# Patient Record
Sex: Female | Born: 1980 | Race: White | Hispanic: No | Marital: Married | Smoking: Current every day smoker
Health system: Southern US, Community
[De-identification: ages and names within clinical notes are randomized; demographics above are authoritative.]

## PROBLEM LIST (undated history)

## (undated) DIAGNOSIS — E119 Type 2 diabetes mellitus without complications: Secondary | ICD-10-CM

## (undated) DIAGNOSIS — J45909 Unspecified asthma, uncomplicated: Secondary | ICD-10-CM

## (undated) DIAGNOSIS — E039 Hypothyroidism, unspecified: Secondary | ICD-10-CM

## (undated) HISTORY — PX: NO PAST SURGERIES: SHX2092

## (undated) HISTORY — DX: Type 2 diabetes mellitus without complications: E11.9

## (undated) HISTORY — DX: Hypothyroidism, unspecified: E03.9

## (undated) HISTORY — PX: TONSILLECTOMY: SUR1361

## (undated) HISTORY — DX: Unspecified asthma, uncomplicated: J45.909

---

## 2015-06-22 ENCOUNTER — Encounter: Payer: Self-pay | Admitting: *Deleted

## 2015-06-22 DIAGNOSIS — O09523 Supervision of elderly multigravida, third trimester: Secondary | ICD-10-CM | POA: Insufficient documentation

## 2015-06-22 DIAGNOSIS — Z3490 Encounter for supervision of normal pregnancy, unspecified, unspecified trimester: Secondary | ICD-10-CM

## 2015-07-11 ENCOUNTER — Ambulatory Visit (INDEPENDENT_AMBULATORY_CARE_PROVIDER_SITE_OTHER): Payer: Medicaid Other | Admitting: Obstetrics & Gynecology

## 2015-07-11 ENCOUNTER — Encounter: Payer: Self-pay | Admitting: Obstetrics & Gynecology

## 2015-07-11 ENCOUNTER — Other Ambulatory Visit: Payer: Self-pay | Admitting: Obstetrics & Gynecology

## 2015-07-11 ENCOUNTER — Ambulatory Visit (HOSPITAL_COMMUNITY)
Admission: RE | Admit: 2015-07-11 | Discharge: 2015-07-11 | Disposition: A | Discharge status: Home / Self Care | Payer: Medicaid Other | Source: Ambulatory Visit | Attending: Obstetrics & Gynecology | Admitting: Obstetrics & Gynecology

## 2015-07-11 VITALS — BP 137/83 | HR 71 | Ht 64.0 in | Wt 198.4 lb

## 2015-07-11 DIAGNOSIS — Z3689 Encounter for other specified antenatal screening: Secondary | ICD-10-CM

## 2015-07-11 DIAGNOSIS — Z3A01 Less than 8 weeks gestation of pregnancy: Secondary | ICD-10-CM | POA: Diagnosis not present

## 2015-07-11 DIAGNOSIS — Z3491 Encounter for supervision of normal pregnancy, unspecified, first trimester: Secondary | ICD-10-CM

## 2015-07-11 DIAGNOSIS — Z1151 Encounter for screening for human papillomavirus (HPV): Secondary | ICD-10-CM

## 2015-07-11 DIAGNOSIS — E282 Polycystic ovarian syndrome: Secondary | ICD-10-CM

## 2015-07-11 DIAGNOSIS — O3680X0 Pregnancy with inconclusive fetal viability, not applicable or unspecified: Secondary | ICD-10-CM

## 2015-07-11 DIAGNOSIS — Z113 Encounter for screening for infections with a predominantly sexual mode of transmission: Secondary | ICD-10-CM

## 2015-07-11 DIAGNOSIS — E039 Hypothyroidism, unspecified: Secondary | ICD-10-CM | POA: Diagnosis not present

## 2015-07-11 DIAGNOSIS — O99281 Endocrine, nutritional and metabolic diseases complicating pregnancy, first trimester: Secondary | ICD-10-CM | POA: Diagnosis not present

## 2015-07-11 DIAGNOSIS — Z124 Encounter for screening for malignant neoplasm of cervix: Secondary | ICD-10-CM

## 2015-07-11 DIAGNOSIS — K089 Disorder of teeth and supporting structures, unspecified: Secondary | ICD-10-CM

## 2015-07-11 DIAGNOSIS — K219 Gastro-esophageal reflux disease without esophagitis: Secondary | ICD-10-CM

## 2015-07-11 DIAGNOSIS — O4691 Antepartum hemorrhage, unspecified, first trimester: Secondary | ICD-10-CM | POA: Insufficient documentation

## 2015-07-11 DIAGNOSIS — Z36 Encounter for antenatal screening of mother: Secondary | ICD-10-CM | POA: Insufficient documentation

## 2015-07-11 DIAGNOSIS — J4520 Mild intermittent asthma, uncomplicated: Secondary | ICD-10-CM

## 2015-07-11 LAB — POCT URINALYSIS DIP (DEVICE)
BILIRUBIN URINE: NEGATIVE
Glucose, UA: NEGATIVE mg/dL
HGB URINE DIPSTICK: NEGATIVE
KETONES UR: NEGATIVE mg/dL
LEUKOCYTES UA: NEGATIVE
Nitrite: NEGATIVE
PH: 5.5 (ref 5.0–8.0)
Protein, ur: NEGATIVE mg/dL
Urobilinogen, UA: 0.2 mg/dL (ref 0.0–1.0)

## 2015-07-11 MED ORDER — RANITIDINE HCL 150 MG PO TABS: 150.0000 mg | ORAL_TABLET | Freq: Two times a day (BID) | ORAL | Status: DC

## 2015-07-11 MED ORDER — CETIRIZINE HCL 10 MG PO TABS: 10.0000 mg | ORAL_TABLET | Freq: Every day | ORAL | Status: DC

## 2015-07-11 NOTE — Progress Notes (Signed)
First Trimester Screen scheduled for 05/16 @ 2pm. Patient to be worked in to [facility] today for viability. Dental list given to patient.

## 2015-07-11 NOTE — Progress Notes (Signed)
Pt reports not taking her thyroid medication; requesting to take Zantac for heartburn Edema- hands New ob packet given  Declined flu vaccine  Early glucola due to BMI > 30

## 2015-07-12 ENCOUNTER — Other Ambulatory Visit: Payer: Self-pay | Admitting: Obstetrics & Gynecology

## 2015-07-12 ENCOUNTER — Encounter: Payer: Self-pay | Admitting: Obstetrics & Gynecology

## 2015-07-12 ENCOUNTER — Telehealth: Payer: Self-pay

## 2015-07-12 DIAGNOSIS — E282 Polycystic ovarian syndrome: Secondary | ICD-10-CM | POA: Insufficient documentation

## 2015-07-12 DIAGNOSIS — E039 Hypothyroidism, unspecified: Secondary | ICD-10-CM | POA: Insufficient documentation

## 2015-07-12 DIAGNOSIS — K089 Disorder of teeth and supporting structures, unspecified: Secondary | ICD-10-CM | POA: Insufficient documentation

## 2015-07-12 DIAGNOSIS — J45909 Unspecified asthma, uncomplicated: Secondary | ICD-10-CM | POA: Insufficient documentation

## 2015-07-12 DIAGNOSIS — K219 Gastro-esophageal reflux disease without esophagitis: Secondary | ICD-10-CM | POA: Insufficient documentation

## 2015-07-12 LAB — PRENATAL PROFILE (SOLSTAS)
ANTIBODY SCREEN: NEGATIVE
HEP B S AG: NEGATIVE
HIV: NONREACTIVE
RH TYPE: POSITIVE
Rubella: 6.4 Index — ABNORMAL HIGH (ref <0.90)

## 2015-07-12 LAB — GLUCOSE TOLERANCE, 1 HOUR (50G) W/O FASTING: Glucose, 1 Hr, gestational: 239 mg/dL — ABNORMAL HIGH (ref <140)

## 2015-07-12 LAB — GC/CHLAMYDIA PROBE AMP (~~LOC~~) NOT AT ARMC
CHLAMYDIA, DNA PROBE: NEGATIVE
Neisseria Gonorrhea: NEGATIVE

## 2015-07-12 LAB — WET PREP, GENITAL
Trich, Wet Prep: NONE SEEN
Yeast Wet Prep HPF POC: NONE SEEN

## 2015-07-12 LAB — TSH: TSH: 4.43 m[IU]/L

## 2015-07-12 LAB — CYTOLOGY - PAP

## 2015-07-12 MED ORDER — METRONIDAZOLE 500 MG PO TABS: 500.0000 mg | ORAL_TABLET | Freq: Two times a day (BID) | ORAL | Status: DC

## 2015-07-12 MED ORDER — ALBUTEROL SULFATE HFA 108 (90 BASE) MCG/ACT IN AERS: 2.0000 | INHALATION_SPRAY | Freq: Four times a day (QID) | RESPIRATORY_TRACT | Status: DC | PRN

## 2015-07-12 MED ORDER — LEVOTHYROXINE SODIUM 50 MCG PO TABS: 50.0000 ug | ORAL_TABLET | Freq: Every day | ORAL | Status: DC

## 2015-07-12 NOTE — Telephone Encounter (Signed)
TSH elevated--start synthroid 50 mcg daily GCT = 239--needs diabetes counseling ASAP  I have left patient a message to return a call to clinic.

## 2015-07-12 NOTE — Progress Notes (Signed)
   Subjective:    Dominique Mora is a G2P0010 785w2d being seen today for her first obstetrical visit.  Her obstetrical history is significant for advanced maternal age, obesity and PCO, asthma, hypothyroidism, poor dentition. Patient does intend to breast feed. Pregnancy history fully reviewed.  Patient reports allllergy symptoms and would like to restart her Zyrtec.  Pt has associated skin symptoms with urticaria.  Zyrtec helps this.  Pt has some mild asthma issues with occasional wheezing.  Needs new inhaler (expired)  Filed Vitals:   07/11/15 0908 07/11/15 0915  BP: 137/83   Pulse: 71   Height:  5\' 4"  (1.626 m)  Weight: 198 lb 6.4 oz (89.994 kg)     HISTORY: OB History  Gravida Para Term Preterm AB SAB TAB Ectopic Multiple Living  2    1 1         # Outcome Date GA Lbr Len/2nd Weight Sex Delivery Anes PTL Lv  2 Current           1 SAB 10/2002             Past Medical History  Diagnosis Date  . Hypothyroidism    History reviewed. No pertinent past surgical history. History reviewed. No pertinent family history.   Exam    Uterus:     Pelvic Exam:    Perineum: No Hemorrhoids   Vulva: normal   Vagina:  normal mucosa, normal discharge   pH: N/a   Cervix: no lesions and nulliparous appearance   Adnexa: normal adnexa and no mass, fullness, tenderness   Bony Pelvis: average  System: Breast:  normal appearance, no masses or tenderness; left breast greater than right breast, sore with exam   Skin: normal coloration and turgor, no rashes    Neurologic: oriented, normal mood   Extremities: no deformities   HEENT oropharynx clear, no lesions and neck supple with midline trachea   Mouth/Teeth mucous membranes moist, pharynx normal without lesions and dental hygiene poor   Neck supple and no masses   Cardiovascular: regular rate and rhythm   Respiratory:  appears well, vitals normal, no respiratory distress, acyanotic, normal RR, chest clear, no wheezing, crepitations, rhonchi,  normal symmetric air entry   Abdomen: soft, non-tender; bowel sounds normal; no masses,  no organomegaly   Urinary: urethral meatus normal      Assessment:    Pregnancy: G2P0010 Patient Active Problem List   Diagnosis Date Noted  . Asthma 07/12/2015  . PCO (polycystic ovaries) 07/12/2015  . Hypothyroid 07/12/2015  . Poor dentition 07/12/2015  . Supervision of low-risk pregnancy 06/22/2015        Plan:      Initial labs drawn.  Prenatal vitamins.  Problem list reviewed and updated.  Genetic Screening discussed First Screen: ordered.  Ultrasound discussed; fetal survey: requested.  Needs viability today.  Allergy/asthma--refill inhaler and zyrtec  GERD-refill Zantac  Hypothyroidism--Check TSH and trat accordingly  Hx PCO--early GCT  Follow up in 4 weeks.  50% of 45 min visit spent on counseling and coordination of care.    Ashe Gago H. 07/12/2015

## 2015-07-12 NOTE — Addendum Note (Signed)
Addended by: Lesly DukesLEGGETT, KELLY H on: 07/12/2015 05:20 AM   Modules accepted: Orders

## 2015-07-12 NOTE — Progress Notes (Signed)
Pt has been notified of results.

## 2015-07-12 NOTE — Progress Notes (Signed)
Pt has symptomatic BV.  Rx with flagyl.

## 2015-07-12 NOTE — Progress Notes (Signed)
Addendum: TSH elevated--start synthroid 50 mcg daily GCT = 239--needs diabetes counseling ASAP

## 2015-07-12 NOTE — Telephone Encounter (Signed)
I have spoken with patient she is aware of lab results.  Please scheduled her for diabetic counseling

## 2015-07-13 LAB — CULTURE, OB URINE
COLONY COUNT: NO GROWTH
ORGANISM ID, BACTERIA: NO GROWTH

## 2015-07-14 LAB — PRESCRIPTION MONITORING PROFILE (19 PANEL)
AMPHETAMINE/METH: NEGATIVE ng/mL
BARBITURATE SCREEN, URINE: NEGATIVE ng/mL
Benzodiazepine Screen, Urine: NEGATIVE ng/mL
Buprenorphine, Urine: NEGATIVE ng/mL
CARISOPRODOL, URINE: NEGATIVE ng/mL
Cocaine Metabolites: NEGATIVE ng/mL
Creatinine, Urine: 168.74 mg/dL (ref >20.0)
Fentanyl, Ur: NEGATIVE ng/mL
MDMA URINE: NEGATIVE ng/mL
Meperidine, Ur: NEGATIVE ng/mL
Methadone Screen, Urine: NEGATIVE ng/mL
Methaqualone: NEGATIVE ng/mL
Nitrites, Initial: NEGATIVE ug/mL
OPIATE SCREEN, URINE: NEGATIVE ng/mL
OXYCODONE SCRN UR: NEGATIVE ng/mL
PHENCYCLIDINE, UR: NEGATIVE ng/mL
Propoxyphene: NEGATIVE ng/mL
TAPENTADOLUR: NEGATIVE ng/mL
Tramadol Scrn, Ur: NEGATIVE ng/mL
ZOLPIDEM, URINE: NEGATIVE ng/mL
pH, Initial: 5.3 pH (ref 4.5–8.9)

## 2015-07-14 LAB — CANNABANOIDS (GC/LC/MS), URINE

## 2015-07-15 ENCOUNTER — Telehealth: Payer: Self-pay

## 2015-07-15 NOTE — Telephone Encounter (Signed)
Normal cytology, +HARHPV--> Needs cotesting in 1 year. 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

## 2015-07-21 ENCOUNTER — Encounter: Payer: Self-pay | Admitting: Family Medicine

## 2015-07-21 DIAGNOSIS — O24919 Unspecified diabetes mellitus in pregnancy, unspecified trimester: Secondary | ICD-10-CM | POA: Insufficient documentation

## 2015-07-26 ENCOUNTER — Other Ambulatory Visit (HOSPITAL_COMMUNITY): Payer: Self-pay

## 2015-08-09 ENCOUNTER — Encounter: Payer: Self-pay | Admitting: Student

## 2015-08-09 ENCOUNTER — Encounter: Payer: Self-pay | Admitting: Obstetrics and Gynecology

## 2015-08-09 ENCOUNTER — Ambulatory Visit (INDEPENDENT_AMBULATORY_CARE_PROVIDER_SITE_OTHER): Payer: Self-pay | Admitting: Advanced Practice Midwife

## 2015-08-09 VITALS — BP 122/76 | HR 81 | Wt 205.0 lb

## 2015-08-09 DIAGNOSIS — O0991 Supervision of high risk pregnancy, unspecified, first trimester: Secondary | ICD-10-CM

## 2015-08-09 DIAGNOSIS — O24911 Unspecified diabetes mellitus in pregnancy, first trimester: Secondary | ICD-10-CM

## 2015-08-09 DIAGNOSIS — O99281 Endocrine, nutritional and metabolic diseases complicating pregnancy, first trimester: Secondary | ICD-10-CM

## 2015-08-09 DIAGNOSIS — E039 Hypothyroidism, unspecified: Secondary | ICD-10-CM

## 2015-08-09 DIAGNOSIS — E031 Congenital hypothyroidism without goiter: Secondary | ICD-10-CM

## 2015-08-09 LAB — POCT URINALYSIS DIP (DEVICE)
Bilirubin Urine: NEGATIVE
Glucose, UA: NEGATIVE mg/dL
Hgb urine dipstick: NEGATIVE
Leukocytes, UA: NEGATIVE
Nitrite: NEGATIVE
Protein, ur: NEGATIVE mg/dL
Specific Gravity, Urine: >=1.03 (ref 1.005–1.030)
Urobilinogen, UA: 0.2 mg/dL (ref 0.0–1.0)
pH: 5 (ref 5.0–8.0)

## 2015-08-09 MED ORDER — GLUCOSE BLOOD VI STRP: ORAL_STRIP | Status: DC

## 2015-08-09 MED ORDER — ACCU-CHEK AVIVA PLUS W/DEVICE KIT: 1.0000 | PACK | Freq: Once | Status: DC

## 2015-08-09 MED ORDER — ACCU-CHEK FASTCLIX LANCETS MISC: 1.0000 | Freq: Four times a day (QID) | Status: DC

## 2015-08-09 NOTE — Patient Instructions (Signed)
Gestational Diabetes Mellitus  Gestational diabetes mellitus, often simply referred to as gestational diabetes, is a type of diabetes that some women develop during pregnancy. In gestational diabetes, the pancreas does not make enough insulin (a hormone), the cells are less responsive to the insulin that is made (insulin resistance), or both. Normally, insulin moves sugars from food into the tissue cells. The tissue cells use the sugars for energy. The lack of insulin or the lack of normal response to insulin causes excess sugars to build up in the blood instead of going into the tissue cells. As a result, high blood sugar (hyperglycemia) develops. The effect of high sugar (glucose) levels can cause many problems.   RISK FACTORS  You have an increased chance of developing gestational diabetes if you have a family history of diabetes and also have one or more of the following risk factors:  · A body mass index over 30 (obesity).  · A previous pregnancy with gestational diabetes.  · An older age at the time of pregnancy.  If blood glucose levels are kept in the normal range during pregnancy, women can have a healthy pregnancy. If your blood glucose levels are not well controlled, there may be risks to you, your unborn baby (fetus), your labor and delivery, or your newborn baby.   SYMPTOMS   If symptoms are experienced, they are much like symptoms you would normally expect during pregnancy. The symptoms of gestational diabetes include:   · Increased thirst (polydipsia).  · Increased urination (polyuria).  · Increased urination during the night (nocturia).  · Weight loss. This weight loss may be rapid.  · Frequent, recurring infections.  · Tiredness (fatigue).  · Weakness.  · Vision changes, such as blurred vision.  · Fruity smell to your breath.  · Abdominal pain.  DIAGNOSIS  Diabetes is diagnosed when blood glucose levels are increased. Your blood glucose level may be checked by one or more of the following blood  tests:  · A fasting blood glucose test. You will not be allowed to eat for at least 8 hours before a blood sample is taken.  · A random blood glucose test. Your blood glucose is checked at any time of the day regardless of when you ate.  · An oral glucose tolerance test (OGTT). Your blood glucose is measured after you have not eaten (fasted) for 1-3 hours and then after you drink a glucose-containing beverage. Since the hormones that cause insulin resistance are highest at about 24-28 weeks of a pregnancy, an OGTT is usually performed during that time. If you have risk factors, you may be screened for undiagnosed type 2 diabetes at your first prenatal visit.  TREATMENT   Gestational diabetes should be managed first with diet and exercise. Medicines may be added only if they are needed.  · You will need to take diabetes medicine or insulin daily to keep blood glucose levels in the desired range.  · You will need to match insulin dosing with exercise and healthy food choices.  If you have gestational diabetes, your treatment goal is to maintain the following blood glucose levels:  · Before meals (preprandial): at or below 95 mg/dL.  · After meals (postprandial):    One hour after a meal: at or below 140 mg/dL.    Two hours after a meal: at or below 120 mg/dL.  If you have pre-existing type 1 or type 2 diabetes, your treatment goal is to maintain the following blood glucose levels:  · Before   meals, at bedtime, and overnight: 60-99 mg/dL.  · After meals: peak of 100-129 mg/dL.  HOME CARE INSTRUCTIONS   · Have your hemoglobin A1c level checked twice a year.  · Perform daily blood glucose monitoring as directed by your health care provider. It is common to perform frequent blood glucose monitoring.  · Monitor urine ketones when you are ill and as directed by your health care provider.  · Take your diabetes medicine and insulin as directed by your health care provider to maintain your blood glucose level in the desired  range.  ¨ Never run out of diabetes medicine or insulin. It is needed every day.  ¨ Adjust insulin based on your intake of carbohydrates. Carbohydrates can raise blood glucose levels but need to be included in your diet. Carbohydrates provide vitamins, minerals, and fiber which are an essential part of a healthy diet. Carbohydrates are found in fruits, vegetables, whole grains, dairy products, legumes, and foods containing added sugars.  · Eat healthy foods. Alternate 3 meals with 3 snacks.  · Maintain a healthy weight gain. The usual total expected weight gain varies according to your prepregnancy body mass index (BMI).  · Carry a medical alert card or wear your medical alert jewelry.  · Carry a 15-gram carbohydrate snack with you at all times to treat low blood glucose (hypoglycemia). Some examples of 15-gram carbohydrate snacks include:  ¨ Glucose tablets, 3 or 4.  ¨ Glucose gel, 15-gram tube.  ¨ Raisins, 2 tablespoons (24 g).  ¨ Jelly beans, 6.  ¨ Animal crackers, 8.  ¨ Fruit juice, regular soda, or low-fat milk, 4 ounces (120 mL).  ¨ Gummy treats, 9.  · Recognize hypoglycemia. Hypoglycemia during pregnancy occurs with blood glucose levels of 60 mg/dL and below. The risk for hypoglycemia increases when fasting or skipping meals, during or after intense exercise, and during sleep. Hypoglycemia symptoms can include:  ¨ Tremors or shakes.  ¨ Decreased ability to concentrate.  ¨ Sweating.  ¨ Increased heart rate.  ¨ Headache.  ¨ Dry mouth.  ¨ Hunger.  ¨ Irritability.  ¨ Anxiety.  ¨ Restless sleep.  ¨ Altered speech or coordination.  ¨ Confusion.  · Treat hypoglycemia promptly. If you are alert and able to safely swallow, follow the 15:15 rule:  ¨ Take 15-20 grams of rapid-acting glucose or carbohydrate. Rapid-acting options include glucose gel, glucose tablets, or 4 ounces (120 mL) of fruit juice, regular soda, or low-fat milk.  ¨ Check your blood glucose level 15 minutes after taking the glucose.  ¨ Take 15-20  grams more of glucose if the repeat blood glucose level is still 70 mg/dL or below.  ¨ Eat a meal or snack within 1 hour once blood glucose levels return to normal.  · Be alert to polyuria (excess urination) and polydipsia (excess thirst) which are early signs of hyperglycemia. An early awareness of hyperglycemia allows for prompt treatment. Treat hyperglycemia as directed by your health care provider.  · Engage in at least 30 minutes of physical activity a day or as directed by your health care provider. Ten minutes of physical activity timed 30 minutes after each meal is encouraged to control postprandial blood glucose levels.  · Adjust your insulin dosing and food intake as needed if you start a new exercise or sport.  · Follow your sick-day plan at any time you are unable to eat or drink as usual.  · Avoid tobacco and alcohol use.  · Keep all follow-up visits as directed   by your health care provider.  · Follow the advice of your health care provider regarding your prenatal and post-delivery (postpartum) appointments, meal planning, exercise, medicines, vitamins, blood tests, other medical tests, and physical activities.  · Perform daily skin and foot care. Examine your skin and feet daily for cuts, bruises, redness, nail problems, bleeding, blisters, or sores.  · Brush your teeth and gums at least twice a day and floss at least once a day. Follow up with your dentist regularly.  · Schedule an eye exam during the first trimester of your pregnancy or as directed by your health care provider.  · Share your diabetes management plan with your workplace or school.  · Stay up-to-date with immunizations.  · Learn to manage stress.  · Obtain ongoing diabetes education and support as needed.  · Learn about and consider breastfeeding your baby.  · You should have your blood sugar level checked 6-12 weeks after delivery. This is done with an oral glucose tolerance test (OGTT).  SEEK MEDICAL CARE IF:   · You are unable to  eat food or drink fluids for more than 6 hours.  · You have nausea and vomiting for more than 6 hours.  · You have a blood glucose level of 200 mg/dL and you have ketones in your urine.  · There is a change in mental status.  · You develop vision problems.  · You have a persistent headache.  · You have upper abdominal pain or discomfort.  · You develop an additional serious illness.  · You have diarrhea for more than 6 hours.  · You have been sick or have had a fever for a couple of days and are not getting better.  SEEK IMMEDIATE MEDICAL CARE IF:   · You have difficulty breathing.  · You no longer feel the baby moving.  · You are bleeding or have discharge from your vagina.  · You start having premature contractions or labor.  MAKE SURE YOU:  · Understand these instructions.  · Will watch your condition.  · Will get help right away if you are not doing well or get worse.     This information is not intended to replace advice given to you by your health care provider. Make sure you discuss any questions you have with your health care provider.     Document Released: 06/04/2000 Document Revised: 03/19/2014 Document Reviewed: 09/25/2011  Elsevier Interactive Patient Education ©2016 [employer]

## 2015-08-09 NOTE — Progress Notes (Signed)
Subjective:  Dominique Mora is a 35 y.o. G2P0010 at 41w2dbeing seen today for ongoing prenatal care.  She is currently monitored for the following issues for this high-risk pregnancy and has Supervision of high risk pregnancy in first trimester; Asthma; PCO (polycystic ovaries); Hypothyroid; Poor dentition; Acid reflux; and Diabetes mellitus in pregnancy, antepartum on her problem list.  Patient reports no complaints.  Contractions: Not present. Vag. Bleeding: None.  Movement: Absent. Denies leaking of fluid.   The following portions of the patient's history were reviewed and updated as appropriate: allergies, current medications, past family history, past medical history, past social history, past surgical history and problem list. Problem list updated.  Objective:   Filed Vitals:   08/09/15 1004  BP: 122/76  Pulse: 81  Weight: 205 lb (92.987 kg)    Fetal Status: Fetal Heart Rate (bpm): 160   Movement: Absent     General:  Alert, oriented and cooperative. Patient is in no acute distress.  Skin: Skin is warm and dry. No rash noted.   Cardiovascular: Normal heart rate noted  Respiratory: Normal respiratory effort, no problems with respiration noted  Abdomen: Soft, gravid, appropriate for gestational age. Pain/Pressure: Present     Pelvic: Vag. Bleeding: None     Cervical exam deferred        Extremities: Normal range of motion.  Edema: Trace  Mental Status: Normal mood and affect. Normal behavior. Normal judgment and thought content.   Urinalysis: Urine Protein: Negative Urine Glucose: Negative  Assessment and Plan:  Pregnancy: G2P0010 at 140w2d1. Diabetes mellitus in pregnancy, antepartum, first trimester  - Comp Met (CMET) - Protein, Urine, 24 hour - HgB A1c - Blood Glucose Monitoring Suppl (ACCU-CHEK AVIVA PLUS) w/Device KIT; 1 Device by Does not apply route once.  Dispense: 1 kit; Refill: 0 - glucose blood (ACCU-CHEK AVIVA PLUS) test strip; To be used 4 times a day   Dispense: 100 each; Refill: 12 - ACCU-CHEK FASTCLIX LANCETS MISC; 1 Device by Percutaneous route 4 (four) times daily.  Dispense: 100 each; Refill: 12 -Scheduled for diabetes education class on Monday.  2. Supervision of high risk pregnancy in first trimester   3. Congenital hypothyroidism without goiter --Pt taking synthroid but misses pills frequently. Discussed ways to remember to take daily pill.  Pt aware that her TSH will be redrawn at next visit and Q trimester.  Preterm labor symptoms and general obstetric precautions including but not limited to vaginal bleeding, contractions, leaking of fluid and fetal movement were reviewed in detail with the patient. Please refer to After Visit Summary for other counseling recommendations.  Return in about 4 weeks (around 09/06/2015) for high risk clinic.   LiElvera MariaCNM

## 2015-08-09 NOTE — Progress Notes (Signed)
Patient left today before getting labs. Called patient and she states she can come tomorrow.

## 2015-08-10 ENCOUNTER — Other Ambulatory Visit: Payer: Self-pay

## 2015-08-15 ENCOUNTER — Encounter: Payer: Medicaid Other | Attending: Obstetrics & Gynecology | Admitting: Dietician

## 2015-08-15 ENCOUNTER — Encounter (HOSPITAL_COMMUNITY): Payer: Self-pay

## 2015-08-15 ENCOUNTER — Ambulatory Visit: Payer: Medicaid Other | Admitting: *Deleted

## 2015-08-15 DIAGNOSIS — Z029 Encounter for administrative examinations, unspecified: Secondary | ICD-10-CM | POA: Insufficient documentation

## 2015-08-15 DIAGNOSIS — O24919 Unspecified diabetes mellitus in pregnancy, unspecified trimester: Secondary | ICD-10-CM

## 2015-08-15 LAB — COMPREHENSIVE METABOLIC PANEL
ALT: 16 U/L (ref 6–29)
AST: 12 U/L (ref 10–30)
Albumin: 3.9 g/dL (ref 3.6–5.1)
Alkaline Phosphatase: 33 U/L (ref 33–115)
BUN: 8 mg/dL (ref 7–25)
CALCIUM: 9.4 mg/dL (ref 8.6–10.2)
CO2: 20 mmol/L (ref 20–31)
CREATININE: 0.59 mg/dL (ref 0.50–1.10)
Chloride: 101 mmol/L (ref 98–110)
GLUCOSE: 224 mg/dL — AB (ref 65–99)
Potassium: 4.3 mmol/L (ref 3.5–5.3)
SODIUM: 133 mmol/L — AB (ref 135–146)
Total Bilirubin: 0.4 mg/dL (ref 0.2–1.2)
Total Protein: 6.8 g/dL (ref 6.1–8.1)

## 2015-08-15 LAB — HEMOGLOBIN A1C
Hgb A1c MFr Bld: 6.8 % — ABNORMAL HIGH (ref <5.7)
Mean Plasma Glucose: 148 mg/dL

## 2015-08-15 NOTE — Progress Notes (Signed)
Diabetes Education:  08/15/15 First visit with me.  This is her first pregnancy.  She is mom to her husband/significant others 3 children.  [place] N[place].   Gives Hx of living out of [place] and being diagnosed with type 2 DM with a HgA1C of 7.0%. Proceeded to lose 60 lbs and "did not have to start Metformin and A1C came down to normal."  Moved to [place] and children were taken into their home. Their income has markedly decreased, food purchasing is an issue.  She has gained some of the weigh back and is having high blood glucose levels. Requesting "medicine to bring blood glucose down, because, I can't afford to go on the low carb diet I did before.  We don't have the money."   Has not had a diet plan.  Provided the recommended plan. Completed a review of Carb counting. Completed review of blood glucose testing procedures. Completed review of appropriate times to check blood glucose levels. Recommended daily walking in cooler part of the day for 30 minutes. She is to bring me a copy of her blood glucose readings following closer carb/portion control and walking regularly. Will provide these to Dr. Shawnie PonsPratt when she brings them on 08/22/15. She request to not see the RD today. Maggie Kingston Shawgo, RN, RD, LDN

## 2015-08-16 ENCOUNTER — Encounter (HOSPITAL_COMMUNITY): Payer: Self-pay

## 2015-08-16 ENCOUNTER — Ambulatory Visit (HOSPITAL_COMMUNITY): Payer: Medicaid Other

## 2015-08-16 ENCOUNTER — Other Ambulatory Visit: Payer: Self-pay | Admitting: Obstetrics & Gynecology

## 2015-08-16 ENCOUNTER — Ambulatory Visit (HOSPITAL_COMMUNITY): Admission: RE | Admit: 2015-08-16 | Payer: Medicaid Other | Source: Ambulatory Visit

## 2015-08-16 ENCOUNTER — Ambulatory Visit (HOSPITAL_COMMUNITY)
Admission: RE | Admit: 2015-08-16 | Discharge: 2015-08-16 | Disposition: A | Discharge status: Home / Self Care | Payer: Medicaid Other | Source: Ambulatory Visit | Attending: Obstetrics & Gynecology | Admitting: Obstetrics & Gynecology

## 2015-08-16 VITALS — BP 132/78 | HR 102 | Wt 204.0 lb

## 2015-08-16 DIAGNOSIS — Z315 Encounter for genetic counseling: Secondary | ICD-10-CM | POA: Insufficient documentation

## 2015-08-16 DIAGNOSIS — E039 Hypothyroidism, unspecified: Secondary | ICD-10-CM | POA: Diagnosis not present

## 2015-08-16 DIAGNOSIS — Z3A12 12 weeks gestation of pregnancy: Secondary | ICD-10-CM

## 2015-08-16 DIAGNOSIS — Z369 Encounter for antenatal screening, unspecified: Secondary | ICD-10-CM

## 2015-08-16 DIAGNOSIS — O09529 Supervision of elderly multigravida, unspecified trimester: Secondary | ICD-10-CM

## 2015-08-16 DIAGNOSIS — O09521 Supervision of elderly multigravida, first trimester: Secondary | ICD-10-CM | POA: Diagnosis not present

## 2015-08-16 DIAGNOSIS — Z3491 Encounter for supervision of normal pregnancy, unspecified, first trimester: Secondary | ICD-10-CM

## 2015-08-16 DIAGNOSIS — O99281 Endocrine, nutritional and metabolic diseases complicating pregnancy, first trimester: Secondary | ICD-10-CM | POA: Insufficient documentation

## 2015-08-16 DIAGNOSIS — E119 Type 2 diabetes mellitus without complications: Secondary | ICD-10-CM | POA: Diagnosis not present

## 2015-08-16 DIAGNOSIS — O24111 Pre-existing diabetes mellitus, type 2, in pregnancy, first trimester: Secondary | ICD-10-CM | POA: Insufficient documentation

## 2015-08-16 DIAGNOSIS — O0991 Supervision of high risk pregnancy, unspecified, first trimester: Secondary | ICD-10-CM

## 2015-08-16 DIAGNOSIS — O24911 Unspecified diabetes mellitus in pregnancy, first trimester: Secondary | ICD-10-CM

## 2015-08-16 NOTE — Progress Notes (Signed)
Genetic Counseling  High-Risk Gestation Note  Appointment Date:  08/16/2015 Referred By: Dominique Dukes, MD Date of Birth:  1980-06-04 Partner:  Dominique Mora   Pregnancy History: [id number] Estimated Date of Delivery: 02/26/16 Estimated Gestational Age: [redacted]w[redacted]d Attending: Particia Nearing, MD   Dominique Mora and her partner, Dominique Mora, were seen for genetic counseling because of a maternal age of 35 y.o..     In summary:  Discussed AMA and associated risk for fetal aneuploidy  Discussed options for screening  First screen-patient declined  Quad screen-declined  NIPS-declined  Ultrasound- NT performed today; Detailed ultrasound scheduled for 09/27/15  Fetal echocardiogram-not currently scheduled  Discussed diagnostic testing options (CVS, amniocentesis): patient declined  Reviewed family history concerns  Discussed carrier screening options (CF, SMA, hemoglobinopathies): patient declined  They were counseled regarding maternal age and the association with risk for chromosome conditions due to nondisjunction with aging of the ova.   We reviewed chromosomes, nondisjunction, and the associated 1 in 25 risk for fetal aneuploidy related to a maternal age of 35 y.o. at [redacted]w[redacted]d gestation.  They were counseled that the risk for aneuploidy decreases as gestational age increases, accounting for those pregnancies which spontaneously abort.  We specifically discussed Down syndrome (trisomy 7), trisomies 4 and 49, and sex chromosome aneuploidies (47,XXX and 47,XXY) including the common features and prognoses of each.   We reviewed available screening options including First Screen, Quad screen, noninvasive prenatal screening (NIPS)/cell free DNA (cfDNA) screening, and detailed ultrasound.  They were counseled that screening tests are used to modify a patient's a priori risk for aneuploidy, typically based on age. This estimate provides a pregnancy specific risk assessment. We reviewed  the benefits and limitations of each option. Specifically, we discussed the conditions for which each test screens, the detection rates, and false positive rates of each. They were also counseled regarding diagnostic testing via CVS and amniocentesis. We reviewed the approximate 1 in 300-500 risk for complications from amniocentesis, including spontaneous pregnancy loss. We discussed the possible results that the tests might provide including: positive, negative, unanticipated, and no result. Finally, they were counseled regarding the cost of each option and potential out of pocket expenses.  A nuchal translucency ultrasound was performed today.  The report will be documented separately.  The patient would like to return for a detailed ultrasound at ~18+ weeks gestation.  This appointment was scheduled today.   After careful consideration, the couple declined all additional screening or testing for fetal aneuploidy including First screen, Quad screen, NIPS, CVS, and amniocentesis. They elected to pursue ultrasound only.  They understand that screening tests cannot rule out all birth defects or genetic syndromes. The patient was advised of this limitation and states she still does not want additional testing at this time.   Dominique Mora was provided with written information regarding cystic fibrosis (CF) including the carrier frequency and incidence in the Caucasian population, the availability of carrier testing and prenatal diagnosis if indicated.  In addition, we discussed that CF is routinely screened for as part of the [place] newborn screening panel.  We also discussed that carrier screening is available for Spinal muscular atrophy (SMA) and hemoglobinopathies. She declined carrier screening.   Both family histories were reviewed and found to be contributory for congenital heart disease for Dominique Mora previous Mora with a different partner. He reported that his daughter had a hole in the heart but  that it was because she was born 3 months premature. The hold  closed on its own and did not require medical treatment. She is currently 10135 years old and otherwise healthy. Additionally, Dominique Mora with a congenital heart defect that required surgery. He was also born preterm. He is currently 35 years old and otherwise healthy. Congenital heart defects occur in approximately 1% of pregnancies.  Congenital heart defects may occur due to multifactorial influences, chromosomal abnormalities, genetic syndromes or environmental exposures.  Isolated heart defects are generally multifactorial.  Given the reported family history and assuming multifactorial inheritance, the risk for a congenital heart defect in the current pregnancy, a second degree relative to Dominique Mora, would be approximately 1-2%. We discussed the benefits and limitations of detailed ultrasound and fetal echocardiogram in the second trimester to assess fetal anatomy.    Dominique Mora reported that his son, from a previous partner, has mild autism and attention deficit hyperactivity disorder (ADHD). We discussed that autism is part of the spectrum of conditions referred to as Autistic spectrum disorders (ASD). We discussed that ASDs are among the most common neurodevelopmental disorders, with approximately 1 in 68 children meeting criteria for ASD, according to the Centers for Disease Control. Approximately 80% of individuals diagnosed are female. There is strong evidence that genetic factors play a critical role in development of ASD. There have been recent advances in identifying specific genetic causes of ASD, however, there are still many individuals for whom the etiology of the ASD is not known. The majority of individuals with ASD (70-80%) have essential autism. There is strong evidence that genetic factors play a critical role in development of ASD. Some individuals with ASDs are found to have  causative differences in karyotype analysis, chromosomal microarray analysis, or single genes. These are more likely to be identified in individuals with complex autism spectrum disorders.  Once a family has a Mora with a diagnosis of ASD, there is a 13.5% chance to have another Mora with ASD. Recurrence risk would be estimated to be lower in this case, given that the pregnancy is a half-sibling to the affected individual. They understand that at this time there is not prenatal genetic testing or screening available for ASD for most families in pregnancy.  Dominique Mora reported that his maternal half-brother has a daughter who was born with the intestines on the outside of the body. She was born recently and has not had surgical correction yet. The mother of this baby is reportedly 6018 or 36 years old. We discussed that this description is consistent with a ventral wall defect, and specifically suggestive of gastroschisis. 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  We discussed that gastroschisis occurs in approximately 1 in 5,000-10,000 live births, more often in babies born to younger mothers.  We reviewed that gastroschisis is considered an abdominal wall defect which is characterized by an opening to the right of the umbilicus through which the intestines and other visceral organs protrude.  This opening usually arises from incomplete closure of the lateral folds during the sixth week of gestation.  We discussed that the majority of cases of gastroschisis are thought to be sporadic and multifactorial in etiology, with a low risk of recurrence.  However, there have been reports of both autosomal recessive and dominant inheritance. Given the reported family history, recurrence risk for an abdominal wall defect in the current pregnancy is not expected to be increased.     The patient reported that her mother had schizophrenia and depression, and Dominique Mora reported  that his father has  schizophrenia. We discussed that for the majority of cases of mental health conditions, such as schizophrenia and depression, an underlying genetic cause is not known but a combination of genetic and environmental factors (multifactorial inheritance) are suspected to contribute to their onset.  Recurrence risk of schizophrenia for second degree relatives is estimated to be approximately 3-5%, in the case of multifactorial inheritance observed. In some families, mental health conditions may even be dominant, meaning that when one parent has the condition each Mora could have up to a 50% risk to inherit the condition. We discussed that it might be helpful for pediatricians to be aware of this family history to ensure that family members are followed appropriately. The patient understands that prenatal testing or screening is not available for the majority of mental health conditions.   Dominique Mora also reported a female paternal first Mora with a blood disorder. She had very limited information regarding the specific condition or symptoms but reported that this individual took medication for treatment in childhood which impacted her growth and caused her to be smaller than average. No additional relatives were reported to be affected with a similar condition. We discussed that additional information is needed to assess the etiology, but recurrence risk for the current pregnancy would be estimated to be low given the reported family history. Without further information regarding the provided family history, an accurate genetic risk cannot be calculated. Further genetic counseling is warranted if more information is obtained.  Dominique Mora denied exposure to environmental toxins or chemical agents. She denied the use of street drugs. She reported drinking alcohol prior to being aware of the pregnancy and reported no alcohol use since approximately [redacted] weeks gestation. The all-or-none period was discussed, meaning  exposures that occur in the first 4 weeks of gestation are typically thought to either not affect the pregnancy at all or result in a miscarriage.  Prenatal alcohol exposure can increase the risk for growth delays, small head size, heart defects, eye and facial differences, as well as behavior problems and learning disabilities. The risk of these to occur tends to increase with the amount of alcohol consumed. However, because there is no identified safe amount of alcohol in pregnancy, it is recommended to completely avoid alcohol in pregnancy. Given the reported amount of exposure, risk for associated effects are not expected to be increased in the current pregnancy. The patient reported currently smoking approximately 3-4 cigarettes per day and continues to work on cutting back. The associations of smoking in pregnancy were reviewed and cessation encouraged. She denied significant viral illnesses during the course of her pregnancy. Her medical and surgical histories were contributory for diabetes, which is currently being managed by her OB provider. She is also taking synthroid.    I counseled this couple regarding the above risks and available options.  The approximate face-to-face time with the genetic counselor was 45 minutes.  Dominique Plowman, MS,  Certified Genetic Counselor 08/16/2015

## 2015-08-17 LAB — PROTEIN, URINE, 24 HOUR
Protein, 24H Urine: 122 mg/24 h (ref <150)
Protein, Urine: 6 mg/dL (ref 5–24)

## 2015-08-29 ENCOUNTER — Encounter: Payer: Self-pay | Admitting: Dietician

## 2015-08-29 NOTE — Progress Notes (Signed)
Diabetes Education:  08/29/15 Dominique Mora is due back in clinic on 09/05/15. Spoke with Dr. Gomez CleverlyPickins today and he noted that she could wait to be started on medication for the elevated blood glucose levels at her next clinic appointment.

## 2015-08-29 NOTE — Progress Notes (Signed)
Diabetes Education: 08/29/15 Dominique Mora brought a copy of her blood glucose records to the front desk. Records cover 08/10/15- 08/23/15 Fasting levels:  All are elevarted: 119,126, 134, 104, 131, 124, 131,163, 127,257, [phone],104,124,123. 2 hr post Breakfast : 163, 164,186,217,251,184,129,134,142 264 2 hr post Lunch: range 99-154,176,168, 188, 202. 2 hrs post dinner range: 245, 209, 163, 143, 146, 199. Will notify MD.  She needs if not already to be on medication for glucose control. Maggie Tahra Hitzeman.RN, RD. LDN

## 2015-08-30 ENCOUNTER — Telehealth: Payer: Self-pay | Admitting: *Deleted

## 2015-08-30 NOTE — Telephone Encounter (Addendum)
Pt left message stating that she believes she is supposed to be started on Metformin @ her next visit but is about to go OOT and will be gone 1-2 weeks. She requests that the Rx be sent to her pharmacy so that she can take the medication with her. [facility] chart review[facility] states that pt has many elevated blood sugar values and needs to begin glucose control. Her note further states that Dr. Vergie LivingPickens stated it would be ok to start medication when pt returns on 6/26. No appt for 6/26 has been scheduled. I called and left message for pt stating that we need to know when she will be leaving to go OOT. Also we need to know if a detailed message can be left on her voice mail. Pt will need to be informed that it is not customary to begin that type of medication without close follow up. We will need to check with attending MD.  6/21  1304  Pt left new message stating that she is leaving town tomorrow 6/22 in early am.

## 2015-09-05 ENCOUNTER — Encounter: Payer: Self-pay | Admitting: Obstetrics and Gynecology

## 2015-09-19 ENCOUNTER — Ambulatory Visit (INDEPENDENT_AMBULATORY_CARE_PROVIDER_SITE_OTHER): Payer: Medicaid Other | Admitting: Obstetrics and Gynecology

## 2015-09-19 VITALS — BP 130/71 | HR 89 | Wt 208.0 lb

## 2015-09-19 DIAGNOSIS — O99282 Endocrine, nutritional and metabolic diseases complicating pregnancy, second trimester: Secondary | ICD-10-CM

## 2015-09-19 DIAGNOSIS — O09522 Supervision of elderly multigravida, second trimester: Secondary | ICD-10-CM

## 2015-09-19 DIAGNOSIS — O24912 Unspecified diabetes mellitus in pregnancy, second trimester: Secondary | ICD-10-CM

## 2015-09-19 DIAGNOSIS — E031 Congenital hypothyroidism without goiter: Secondary | ICD-10-CM

## 2015-09-19 DIAGNOSIS — E039 Hypothyroidism, unspecified: Secondary | ICD-10-CM | POA: Diagnosis not present

## 2015-09-19 LAB — POCT URINALYSIS DIP (DEVICE)
BILIRUBIN URINE: NEGATIVE
Glucose, UA: 250 mg/dL — AB
HGB URINE DIPSTICK: NEGATIVE
KETONES UR: NEGATIVE mg/dL
Leukocytes, UA: NEGATIVE
Nitrite: NEGATIVE
PH: 5.5 (ref 5.0–8.0)
PROTEIN: NEGATIVE mg/dL
Urobilinogen, UA: 0.2 mg/dL (ref 0.0–1.0)

## 2015-09-19 MED ORDER — GLYBURIDE 2.5 MG PO TABS: 2.5000 mg | ORAL_TABLET | Freq: Two times a day (BID) | ORAL | Status: DC

## 2015-09-19 NOTE — Progress Notes (Signed)
Patient told to add 1mg  folic acid; she states she has Rx already

## 2015-09-19 NOTE — Progress Notes (Signed)
Subjective:  Dominique Mora is a 35 y.o. G2P0010 at 8841w1d being seen today for ongoing prenatal care.  She is currently monitored for the following issues for this high-risk pregnancy and has Supervision of high risk elderly multigravida in second trimester; Asthma; PCO (polycystic ovaries); Hypothyroid; Poor dentition; Acid reflux; Diabetes mellitus in pregnancy, antepartum; and Advanced maternal age in multigravida on her problem list.  Patient reports no complaints.   Contractions: Not present. Vag. Bleeding: None.  Movement: Present. Denies leaking of fluid.   The following portions of the patient's history were reviewed and updated as appropriate: allergies, current medications, past family history, past medical history, past social history, past surgical history and problem list. Problem list updated.  Objective:   Filed Vitals:   09/19/15 1550  BP: 130/71  Pulse: 89  Weight: 208 lb (94.348 kg)    Fetal Status: Fetal Heart Rate (bpm): 147   Movement: Present     General:  Alert, oriented and cooperative. Patient is in no acute distress.  Skin: Skin is warm and dry. No rash noted.   Cardiovascular: Normal heart rate noted  Respiratory: Normal respiratory effort, no problems with respiration noted  Abdomen: Soft, gravid, appropriate for gestational age. Pain/Pressure: Absent     Pelvic:  Cervical exam deferred        Extremities: Normal range of motion.  Edema: Trace  Mental Status: Normal mood and affect. Normal behavior. Normal judgment and thought content.   Urinalysis: Urine Protein: Negative Urine Glucose: 2+  Assessment and Plan:  Pregnancy: G2P0010 at 941w1d  1. Congenital hypothyroidism without goiter Continue synthroid 50 qday (started in early May). Follow up surveillance TFTs  2. Diabetes mellitus in pregnancy, antepartum, second trimester Missed 6/19 OB visits and just refilled her GDM supplies and had gone about a week or two w/o checking and checked it 2-3x/day in  the past few days. No log today but states AM fastings in the 110s-130s and her 2hr lunch and/or dinner PPs in the 140s. D/w her importance of good GDM control (maternal fetal risks, morbidity, etc). Will start on glyb 2.5mg  with breakfast and dinner and get surveillance a1c today. Fetal anatomy next week. Schedule fetal echo [facility]  3. Supervision of high risk elderly multigravida in second trimester - Alpha fetoprotein, maternal  4. Advanced maternal age in multigravida, second trimester S/p GC (declined anything except NT which was normal)  Preterm labor symptoms and general obstetric precautions including but not limited to vaginal bleeding, contractions, leaking of fluid and fetal movement were reviewed in detail with the patient. Please refer to After Visit Summary for other counseling recommendations.   RTC 1 week  [place] Bingharlie Kendryck Lacroix, MD

## 2015-09-20 LAB — TSH: TSH: 1.85 m[IU]/L

## 2015-09-20 LAB — HEMOGLOBIN A1C
Hgb A1c MFr Bld: 6.7 % — ABNORMAL HIGH (ref <5.7)
Mean Plasma Glucose: 146 mg/dL

## 2015-09-20 LAB — T4, FREE: Free T4: 1 ng/dL (ref 0.8–1.8)

## 2015-09-27 ENCOUNTER — Ambulatory Visit (INDEPENDENT_AMBULATORY_CARE_PROVIDER_SITE_OTHER): Payer: Medicaid Other | Admitting: Family

## 2015-09-27 ENCOUNTER — Encounter (HOSPITAL_COMMUNITY): Payer: Self-pay

## 2015-09-27 ENCOUNTER — Ambulatory Visit (HOSPITAL_COMMUNITY)
Admission: RE | Admit: 2015-09-27 | Discharge: 2015-09-27 | Disposition: A | Discharge status: Home / Self Care | Payer: Medicaid Other | Source: Ambulatory Visit | Attending: Obstetrics & Gynecology | Admitting: Obstetrics & Gynecology

## 2015-09-27 ENCOUNTER — Other Ambulatory Visit (HOSPITAL_COMMUNITY): Payer: Self-pay | Admitting: Maternal and Fetal Medicine

## 2015-09-27 VITALS — BP 123/64 | HR 83 | Wt 210.4 lb

## 2015-09-27 VITALS — BP 123/68 | HR 80 | Wt 208.0 lb

## 2015-09-27 DIAGNOSIS — O09522 Supervision of elderly multigravida, second trimester: Secondary | ICD-10-CM

## 2015-09-27 DIAGNOSIS — O09512 Supervision of elderly primigravida, second trimester: Secondary | ICD-10-CM | POA: Diagnosis not present

## 2015-09-27 DIAGNOSIS — Z3689 Encounter for other specified antenatal screening: Secondary | ICD-10-CM

## 2015-09-27 DIAGNOSIS — O24312 Unspecified pre-existing diabetes mellitus in pregnancy, second trimester: Secondary | ICD-10-CM | POA: Insufficient documentation

## 2015-09-27 DIAGNOSIS — Z36 Encounter for antenatal screening of mother: Secondary | ICD-10-CM | POA: Diagnosis not present

## 2015-09-27 DIAGNOSIS — O24912 Unspecified diabetes mellitus in pregnancy, second trimester: Secondary | ICD-10-CM

## 2015-09-27 DIAGNOSIS — O09529 Supervision of elderly multigravida, unspecified trimester: Secondary | ICD-10-CM

## 2015-09-27 DIAGNOSIS — O24919 Unspecified diabetes mellitus in pregnancy, unspecified trimester: Secondary | ICD-10-CM

## 2015-09-27 DIAGNOSIS — O99282 Endocrine, nutritional and metabolic diseases complicating pregnancy, second trimester: Secondary | ICD-10-CM | POA: Insufficient documentation

## 2015-09-27 DIAGNOSIS — E039 Hypothyroidism, unspecified: Secondary | ICD-10-CM | POA: Insufficient documentation

## 2015-09-27 DIAGNOSIS — Z3A18 18 weeks gestation of pregnancy: Secondary | ICD-10-CM | POA: Diagnosis not present

## 2015-09-27 LAB — POCT URINALYSIS DIP (DEVICE)
Bilirubin Urine: NEGATIVE
Glucose, UA: NEGATIVE mg/dL
Hgb urine dipstick: NEGATIVE
KETONES UR: NEGATIVE mg/dL
Leukocytes, UA: NEGATIVE
Nitrite: NEGATIVE
PH: 5.5 (ref 5.0–8.0)
PROTEIN: NEGATIVE mg/dL
SPECIFIC GRAVITY, URINE: 1.02 (ref 1.005–1.030)
Urobilinogen, UA: 0.2 mg/dL (ref 0.0–1.0)

## 2015-09-27 NOTE — Progress Notes (Signed)
Fasting PPB PPL PPD  103-137, 6/6 abnl 102-191, 2/4 abnl 99-200, 5/6 abnl 104-229, 5/6 abnl   Subjective:  Dominique Mora is a 35 y.o. G2P0010 at 3685w2d being seen today for ongoing prenatal care.  She is currently monitored for the following issues for this high-risk pregnancy and has Supervision of high risk elderly multigravida in second trimester; Asthma; PCO (polycystic ovaries); Hypothyroid; Poor dentition; Acid reflux; Diabetes mellitus in pregnancy, antepartum; and Advanced maternal age in multigravida on her problem list.  Patient reports no complaints.  Contractions: Not present.  .  Movement: Present. Denies leaking of fluid.   The following portions of the patient's history were reviewed and updated as appropriate: allergies, current medications, past family history, past medical history, past social history, past surgical history and problem list. Problem list updated.  Objective:   Filed Vitals:   09/27/15 1139  BP: 123/68  Pulse: 80  Weight: 208 lb (94.348 kg)    Fetal Status: Fetal Heart Rate (bpm): 142 Fundal Height: 19 cm Movement: Present     General:  Alert, oriented and cooperative. Patient is in no acute distress.  Skin: Skin is warm and dry. No rash noted.   Cardiovascular: Normal heart rate noted  Respiratory: Normal respiratory effort, no problems with respiration noted  Abdomen: Soft, gravid, appropriate for gestational age. Pain/Pressure: Absent     Pelvic:  Cervical exam deferred        Extremities: Normal range of motion.  Edema: Trace  Mental Status: Normal mood and affect. Normal behavior. Normal judgment and thought content.   Urinalysis: Urine Protein: Negative Urine Glucose: Negative  Assessment and Plan:  Pregnancy: G2P0010 at 5385w2d  1. Supervision of high risk elderly multigravida in second trimester - Alpha fetoprotein, maternal  2. Advanced maternal age in multigravida, second trimester - AFP today  3. Diabetes mellitus in pregnancy,  antepartum, second trimester - Increase glyburide to 5 mg BID - Reviewed diet (eaitng increased carbs)  Preterm labor symptoms and general obstetric precautions including but not limited to vaginal bleeding, contractions, leaking of fluid and fetal movement were reviewed in detail with the patient. Please refer to After Visit Summary for other counseling recommendations.  Return in about 3 weeks (around 10/18/2015).   Eino FarberWalidah Kennith GainN Karim, CNM

## 2015-09-27 NOTE — Patient Instructions (Signed)
AREA PEDIATRIC/FAMILY PRACTICE PHYSICIANS  [facility] [address] [place], [place] [address] Phone - [phone]   Fax - [phone]  JACK AMOS [address] [place], [place]  [address] Phone - [phone]   Fax - [phone]  [facility] [address] [place], [place]  [address] Phone - [phone]   Fax - [phone]  [facility] [address] [place], [place]  [address] Phone - [phone]   Fax - [phone]  [facility] [address], [address] [place], [place]  [address] Phone - [phone]   Fax - [phone]  [facility] [address], [address] [place], [place]  [address] Phone - [phone]   Fax - [phone]  [facility] [address], [address] [place], [place]  [address] Phone - [phone]   Fax - [phone]  [facility] [address] [place], [place]  [address] Phone - [phone]   Fax - [phone]  [facility] [address] [place], [place]  [address] Phone - [phone]   Fax - [phone] [facility] [address] [place], [place]  [address] Phone - [phone]   Fax - [phone]  [facility] [address] [place], [place]  [address] Phone - [phone]   Fax - [phone]  [facility] [address], [address] [place], [place]  [address] Phone - [phone]   Fax - [phone]  [facility] [address], [address] [place], [place]  [address] Phone - [phone]   Fax - [phone]  [facility] [address], [address] [place], [place]  [address] Phone - [phone]  [facility] [address] [place], [place]  [address] Phone - [phone]   Fax - [phone]  [facility] [address], [address] [place], [place]  [address] Phone - [phone]   Fax - [phone]  [facility] [address] [place], [place]  [address] Phone - [phone]   Fax - [phone]  [facility] [address] [place], [place]  [address] Phone - [phone]   Fax - [phone]   [facility] [address], [address] [place], [place]  [address] Phone - [phone]   Fax - [phone]  [facility] [address], [address] [place], [place]  [address] Phone - [phone]   Fax - [phone]  DAVID RUBIN [address], [address] [place], [place]  [address] Phone - [phone]   Fax - [phone]  [facility] [address] [place], [place]  [address] Phone - [phone]   Fax - [phone]  [facility] [address] [place], [place]  [address] Phone - [phone]   Fax - [phone] [facility] [place] [address] [place], [place]  [address] Phone - [phone]   Fax - [phone]  [facility] [address] [place], [place]  [address] Phone - [phone]   Fax - [phone]  [facility] [place] [address] [place], [place]  [address] Phone - [phone]   Fax - [phone]    Please call [phone] or [phone] to schedule childbirth education classes or visit [email or web address] for a description of the classes.

## 2015-10-04 LAB — ALPHA FETOPROTEIN, MATERNAL

## 2015-10-05 LAB — ALPHA FETOPROTEIN, MATERNAL
AFP: 41.1 ng/mL
CURR GEST AGE: 18.3 wk
MoM for AFP: 1.11
OPEN SPINA BIFIDA: NEGATIVE

## 2015-10-10 ENCOUNTER — Telehealth: Payer: Self-pay | Admitting: *Deleted

## 2015-10-10 DIAGNOSIS — O24912 Unspecified diabetes mellitus in pregnancy, second trimester: Secondary | ICD-10-CM

## 2015-10-10 NOTE — Telephone Encounter (Signed)
Received a call left on the nurse voicemail on 10/07/15 at 1421.  Patient states her glyburide directions were changed and she is now running low on her medication.  States she needs a new prescription with the new directions.  Requests a return call to [phone].

## 2015-10-12 MED ORDER — GLYBURIDE 5 MG PO TABS: 5.0000 mg | ORAL_TABLET | Freq: Two times a day (BID) | ORAL | 1 refills | Status: DC

## 2015-10-12 NOTE — Telephone Encounter (Signed)
Per chart review was instructed by Rochele Pages, CNM at last visit to start glyburide 5 mg bid. I sent in a new prescription for 5mg  bid. I called Dominique Mora and notified her. She states she just got her old prescription filled which was 5 mg tablets but was only 30 tabs and will need new rx- will pick up new rx soon. She voices understanding she should be taking 5mg  bid.

## 2015-10-25 ENCOUNTER — Other Ambulatory Visit (HOSPITAL_COMMUNITY): Payer: Self-pay | Admitting: Maternal and Fetal Medicine

## 2015-10-25 ENCOUNTER — Ambulatory Visit (INDEPENDENT_AMBULATORY_CARE_PROVIDER_SITE_OTHER): Payer: Medicaid Other | Admitting: Family

## 2015-10-25 ENCOUNTER — Ambulatory Visit (HOSPITAL_COMMUNITY)
Admission: RE | Admit: 2015-10-25 | Discharge: 2015-10-25 | Disposition: A | Discharge status: Home / Self Care | Payer: Medicaid Other | Source: Ambulatory Visit | Attending: Obstetrics & Gynecology | Admitting: Obstetrics & Gynecology

## 2015-10-25 ENCOUNTER — Encounter (HOSPITAL_COMMUNITY): Payer: Self-pay

## 2015-10-25 VITALS — BP 124/76 | HR 80

## 2015-10-25 DIAGNOSIS — E039 Hypothyroidism, unspecified: Secondary | ICD-10-CM

## 2015-10-25 DIAGNOSIS — O09522 Supervision of elderly multigravida, second trimester: Secondary | ICD-10-CM

## 2015-10-25 DIAGNOSIS — O99282 Endocrine, nutritional and metabolic diseases complicating pregnancy, second trimester: Secondary | ICD-10-CM

## 2015-10-25 DIAGNOSIS — O24312 Unspecified pre-existing diabetes mellitus in pregnancy, second trimester: Secondary | ICD-10-CM | POA: Diagnosis not present

## 2015-10-25 DIAGNOSIS — Z3A22 22 weeks gestation of pregnancy: Secondary | ICD-10-CM | POA: Diagnosis not present

## 2015-10-25 DIAGNOSIS — O24912 Unspecified diabetes mellitus in pregnancy, second trimester: Secondary | ICD-10-CM

## 2015-10-25 DIAGNOSIS — O24919 Unspecified diabetes mellitus in pregnancy, unspecified trimester: Secondary | ICD-10-CM

## 2015-10-25 LAB — POCT URINALYSIS DIP (DEVICE)
BILIRUBIN URINE: NEGATIVE
GLUCOSE, UA: NEGATIVE mg/dL
HGB URINE DIPSTICK: NEGATIVE
Ketones, ur: NEGATIVE mg/dL
LEUKOCYTES UA: NEGATIVE
Nitrite: NEGATIVE
Protein, ur: NEGATIVE mg/dL
SPECIFIC GRAVITY, URINE: 1.02 (ref 1.005–1.030)
Urobilinogen, UA: 0.2 mg/dL (ref 0.0–1.0)
pH: 6 (ref 5.0–8.0)

## 2015-10-25 MED ORDER — ASPIRIN EC 81 MG PO TBEC: 81.0000 mg | DELAYED_RELEASE_TABLET | Freq: Every day | ORAL | 2 refills | Status: DC

## 2015-10-25 NOTE — Addendum Note (Signed)
Addended by: Shonna ChockWOUK, Aislinn Feliz B on: 10/25/2015 10:15 AM   Modules accepted: Orders

## 2015-10-25 NOTE — Progress Notes (Addendum)
Subjective:  Dominique Mora is a 35 y.o. G2P0010 at 7429w2d being seen today for ongoing prenatal care.  She is currently monitored for the following issues for this high-risk pregnancy and has Supervision of high risk elderly multigravida in second trimester; Asthma; PCO (polycystic ovaries); Hypothyroid; Poor dentition; Acid reflux; Diabetes mellitus in pregnancy, antepartum; and Advanced maternal age in multigravida on her problem list.  Patient reports no complaints.  Contractions: Not present. Vag. Bleeding: None.  Movement: Present. Denies leaking of fluid.   The following portions of the patient's history were reviewed and updated as appropriate: allergies, current medications, past family history, past medical history, past social history, past surgical history and problem list. Problem list updated.  Objective:   Vitals:   10/25/15 0902  BP: 118/68  Pulse: 89  Weight: 212 lb 14.4 oz (96.6 kg)    Fetal Status: Fetal Heart Rate (bpm): 136   Movement: Present     General:  Alert, oriented and cooperative. Patient is in no acute distress.  Skin: Skin is warm and dry. No rash noted.   Cardiovascular: Normal heart rate noted  Respiratory: Normal respiratory effort, no problems with respiration noted  Abdomen: Soft, gravid, appropriate for gestational age. Pain/Pressure: Present     Pelvic:  Cervical exam deferred        Extremities: Normal range of motion.  Edema: Trace  Mental Status: Normal mood and affect. Normal behavior. Normal judgment and thought content.   Urinalysis: Urine Protein: Negative Urine Glucose: Negative  Assessment and Plan:  Pregnancy: G2P0010 at 6229w2d  # GDM - prescribed glyburide 5 bid, pt has taken it upon herself to increase to approximately 7.5 bid. Fastings and PPs well above goal, all of them. Definitely do not think oral agent will suffice.  - f/u Monday with DM educator to start NPH bid and short-acting with meals - until then increase glyburide to 10  bid - fetal echo ordered - growth u/s later today - start aspirin  # Hypothyroid - TFTs wnl last month  Preterm labor symptoms and general obstetric precautions including but not limited to vaginal bleeding, contractions, leaking of fluid and fetal movement were reviewed in detail with the patient. Please refer to After Visit Summary for other counseling recommendations.    Dominique RunningNoah Bedford Azzure Garabedian, MD

## 2015-10-25 NOTE — Progress Notes (Signed)
C/o hip pain

## 2015-10-28 ENCOUNTER — Telehealth: Payer: Self-pay

## 2015-10-28 NOTE — Telephone Encounter (Signed)
Contacted pt in regards to Dr. Ashok PallWouk recommendations to start ASA 81 mg tablet daily and that it has been sent to her [facility] pharmacy.  Pt stated understanding with no further questions.

## 2015-10-31 ENCOUNTER — Ambulatory Visit: Payer: Medicaid Other

## 2015-11-01 ENCOUNTER — Telehealth: Payer: Self-pay | Admitting: General Practice

## 2015-11-01 NOTE — Telephone Encounter (Signed)
Patient's pharmacy called stating the patient was asking about an insulin Rx. Per chart review, patient was supposed to see Maggie yesterday to receive insulin instructions & then a prescription would be sent in. Patient was not seen yesterday and left after a long wait. Called patient, no answer- left message stating we are trying to reach you to reschedule your appt from yesterday. It is very important you be seen & we would like to reschedule you for next Monday, please call the front office staff to set that up.

## 2015-11-10 ENCOUNTER — Telehealth (HOSPITAL_COMMUNITY): Payer: Self-pay | Admitting: MS"

## 2015-11-10 ENCOUNTER — Other Ambulatory Visit (HOSPITAL_COMMUNITY): Payer: Self-pay

## 2015-11-10 NOTE — Telephone Encounter (Signed)
Called Dominique Levinmanda Mora to discuss her prenatal cell free DNA test results.  Mrs. Dominique Mora had Panorama testing through [facility].  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   The patient was identified by name and DOB.  We reviewed that these are within normal limits, showing a less than 1 in 10,000 risk for trisomies 21, 18 and 13, and monosomy X (Turner syndrome).  In addition, the risk for triploidy and sex chromosome trisomies (47,XXX and 47,XXY) was also low risk. We reviewed that this testing identifies > 99% of pregnancies with trisomy 1821, trisomy 713, sex chromosome trisomies (47,XXX and 47,XXY), and triploidy. The detection rate for trisomy 18 is 96%.  The detection rate for monosomy X is ~92%.  The false positive rate is <0.1% for all conditions. Testing was also consistent with female fetal sex.  She understands that this testing does not identify all genetic conditions.  All questions were answered to her satisfaction, she was encouraged to call with additional questions or concerns.  Quinn PlowmanKaren Venice Liz, MS Certified Genetic Counselor 11/10/2015 1:31 PM

## 2015-11-21 ENCOUNTER — Ambulatory Visit (INDEPENDENT_AMBULATORY_CARE_PROVIDER_SITE_OTHER): Payer: Self-pay | Admitting: Clinical

## 2015-11-21 ENCOUNTER — Ambulatory Visit (INDEPENDENT_AMBULATORY_CARE_PROVIDER_SITE_OTHER): Payer: Medicaid Other | Admitting: Obstetrics & Gynecology

## 2015-11-21 DIAGNOSIS — F411 Generalized anxiety disorder: Secondary | ICD-10-CM

## 2015-11-21 DIAGNOSIS — O24912 Unspecified diabetes mellitus in pregnancy, second trimester: Secondary | ICD-10-CM

## 2015-11-21 LAB — POCT URINALYSIS DIP (DEVICE)
BILIRUBIN URINE: NEGATIVE
GLUCOSE, UA: NEGATIVE mg/dL
HGB URINE DIPSTICK: NEGATIVE
Ketones, ur: NEGATIVE mg/dL
Nitrite: NEGATIVE
Protein, ur: NEGATIVE mg/dL
SPECIFIC GRAVITY, URINE: 1.02 (ref 1.005–1.030)
UROBILINOGEN UA: 0.2 mg/dL (ref 0.0–1.0)
pH: 5.5 (ref 5.0–8.0)

## 2015-11-21 LAB — CBC
HEMATOCRIT: 38.8 % (ref 35.0–45.0)
HEMOGLOBIN: 13.2 g/dL (ref 11.7–15.5)
MCH: 30 pg (ref 27.0–33.0)
MCHC: 34 g/dL (ref 32.0–36.0)
MCV: 88.2 fL (ref 80.0–100.0)
MPV: 10.2 fL (ref 7.5–12.5)
Platelets: 423 10*3/uL — ABNORMAL HIGH (ref 140–400)
RBC: 4.4 MIL/uL (ref 3.80–5.10)
RDW: 13.4 % (ref 11.0–15.0)
WBC: 14.8 10*3/uL — AB (ref 3.8–10.8)

## 2015-11-21 LAB — HIV ANTIBODY (ROUTINE TESTING W REFLEX): HIV 1&2 Ab, 4th Generation: NONREACTIVE

## 2015-11-21 MED ORDER — GLYBURIDE 5 MG PO TABS: 10.0000 mg | ORAL_TABLET | Freq: Two times a day (BID) | ORAL | 3 refills | Status: DC

## 2015-11-21 MED ORDER — METFORMIN HCL 500 MG PO TABS: 500.0000 mg | ORAL_TABLET | Freq: Two times a day (BID) | ORAL | 2 refills | Status: DC

## 2015-11-21 NOTE — Progress Notes (Signed)
Patient has not seen diabetic educator appointment. She is not taking insulin at this time. Declined Flu at this time

## 2015-11-21 NOTE — BH Specialist Note (Signed)
  ASSESSMENT: Pt currently experiencing Generalized anxiety disorder. Pt needs to f/[facility] with [facility] and [facility].Pt would benefit from psychoeducation and brief therapeutic intervention regarding coping with anxiety.Pt may benefit from community resources. Stage of Change: precontemplative  PLAN: 1. F/U with behavioral health clinician in one month, or as needed 2. Psychiatric Medications: none 3. Behavioral recommendations:   -Read educational material regarding coping with symptoms of anxiety with panic attacks -Consider community resources discussed in office visit   SUBJECTIVE: Pt. referred by Dr. Penne LashLeggett for symptoms of anxiety  Pt. reports the following symptoms/concerns: Pt states that she has "always had anxiety", and that she copes by "just dealing with it", including "logically talking myself out of the irrational thoughts" and taking a walk or drive to distract herself when irritable. Pt has had panic attacks for years, including several within last year; increased family and financial stressors with current pregnancy. Duration of problem: Undetermined number of years Severity: moderate   OBJECTIVE: Orientation & Cognition: Oriented x3. Thought processes normal and appropriate to situation. Mood: anxious Affect: anxious Appearance: anxious Risk of harm to self or others: no known risk of harm to self or others Substance use: none Assessments administered: PHQ9: 5/ GAD7: 6  Diagnosis: Generalized anxiety disorder CPT Code: F41.1  -------------------------------------------- Other(s) present in the room: FOB  Time spent with patient in exam room: 40 minutes, 12:10-12: 50pm  Depression screen PHQ 2/9 11/21/2015  Decreased Interest 1  Down, Depressed, Hopeless 1  PHQ - 2 Score 2  Altered sleeping 2  Tired, decreased energy 1  Change in appetite 0  Feeling bad or failure about yourself  0  Trouble concentrating 0  Moving slowly or fidgety/restless 0  PHQ-9 Score 5   GAD 7 :  Generalized Anxiety Score 11/21/2015  Nervous, Anxious, on Edge 1  Control/stop worrying 1  Worry too much - different things 1  Trouble relaxing 0  Restless 0  Easily annoyed or irritable 2  Afraid - awful might happen 1  Total GAD 7 Score 6

## 2015-11-22 ENCOUNTER — Other Ambulatory Visit (HOSPITAL_COMMUNITY): Payer: Self-pay | Admitting: Maternal and Fetal Medicine

## 2015-11-22 ENCOUNTER — Ambulatory Visit (HOSPITAL_COMMUNITY)
Admission: RE | Admit: 2015-11-22 | Discharge: 2015-11-22 | Disposition: A | Discharge status: Home / Self Care | Payer: Medicaid Other | Source: Ambulatory Visit | Attending: Obstetrics & Gynecology | Admitting: Obstetrics & Gynecology

## 2015-11-22 ENCOUNTER — Encounter (HOSPITAL_COMMUNITY): Payer: Self-pay

## 2015-11-22 VITALS — BP 125/68 | HR 85 | Wt 217.4 lb

## 2015-11-22 DIAGNOSIS — O24912 Unspecified diabetes mellitus in pregnancy, second trimester: Secondary | ICD-10-CM | POA: Insufficient documentation

## 2015-11-22 DIAGNOSIS — Z3A26 26 weeks gestation of pregnancy: Secondary | ICD-10-CM | POA: Diagnosis not present

## 2015-11-22 DIAGNOSIS — O09522 Supervision of elderly multigravida, second trimester: Secondary | ICD-10-CM

## 2015-11-22 DIAGNOSIS — O24919 Unspecified diabetes mellitus in pregnancy, unspecified trimester: Secondary | ICD-10-CM

## 2015-11-22 DIAGNOSIS — O283 Abnormal ultrasonic finding on antenatal screening of mother: Secondary | ICD-10-CM

## 2015-11-22 DIAGNOSIS — F411 Generalized anxiety disorder: Secondary | ICD-10-CM | POA: Insufficient documentation

## 2015-11-22 LAB — RPR

## 2015-11-22 NOTE — Progress Notes (Signed)
   PRENATAL VISIT NOTE  Subjective:  Dominique Mora is a 35 y.o. G2P0010 at 7032w2d being seen today for ongoing prenatal care.  She is currently monitored for the following issues for this high-risk pregnancy and has Supervision of high risk elderly multigravida in second trimester; Asthma; PCO (polycystic ovaries); Hypothyroid; Poor dentition; Acid reflux; Diabetes mellitus in pregnancy, antepartum; Advanced maternal age in multigravida; and GAD (generalized anxiety disorder) on her problem list.  Patient reports no complaints and anxiety about starting insulin.  Contractions: Not present. Vag. Bleeding: None.  Movement: Present. Denies leaking of fluid.   The following portions of the patient's history were reviewed and updated as appropriate: allergies, current medications, past family history, past medical history, past social history, past surgical history and problem list. Problem list updated.  Objective:   Vitals:   11/21/15 1131  BP: 106/72  Pulse: 72  Weight: 212 lb 8 oz (96.4 kg)    Fetal Status: Fetal Heart Rate (bpm): 137   Movement: Present     General:  Alert, oriented and cooperative. Patient is in no acute distress.  Skin: Skin is warm and dry. No rash noted.   Cardiovascular: Normal heart rate noted  Respiratory: Normal respiratory effort, no problems with respiration noted  Abdomen: Soft, gravid, appropriate for gestational age. Pain/Pressure: Present     Pelvic:  Cervical exam deferred        Extremities: Normal range of motion.     Mental Status: Normal mood and affect. Normal behavior. Normal judgment and thought content.   Urinalysis:      Assessment and Plan:  Pregnancy: G2P0010 at 732w2d  1. Diabetes mellitus in pregnancy, antepartum, second trimester - Refuses insulin again.  Would like to start metformin.  Pt understands this is not optional treatment for DM but she prefers to try this over insulin.  Pt agrees to insulin if fails metformin. Pt understands  hyperglycemia can cause IUFD.  - HIV antibody - CBC - RPR - glyBURIDE (DIABETA) 5 MG tablet; Take 2 tablets (10 mg total) by mouth 2 (two) times daily with a meal.  Dispense: 120 tablet; Refill: 3  2. GAD (generalized anxiety disorder) -referred to LCSW for counseling and advice for medications.  Pt admits to being anxious but refuses meds at this time.  Will continue to have counseling.  3.  Tobacco Abuse Encouraged to quit  4.  Hypothyroid TSH nml  Preterm labor symptoms and general obstetric precautions including but not limited to vaginal bleeding, contractions, leaking of fluid and fetal movement were reviewed in detail with the patient. Please refer to After Visit Summary for other counseling recommendations.  Return in about 2 weeks (around 12/05/2015).  Lesly DukesKelly H Savino Whisenant, MD

## 2015-12-06 ENCOUNTER — Ambulatory Visit (INDEPENDENT_AMBULATORY_CARE_PROVIDER_SITE_OTHER): Payer: Medicaid Other | Admitting: Obstetrics & Gynecology

## 2015-12-06 VITALS — BP 130/84 | HR 92 | Wt 210.5 lb

## 2015-12-06 DIAGNOSIS — Z23 Encounter for immunization: Secondary | ICD-10-CM

## 2015-12-06 DIAGNOSIS — O219 Vomiting of pregnancy, unspecified: Secondary | ICD-10-CM

## 2015-12-06 DIAGNOSIS — O24913 Unspecified diabetes mellitus in pregnancy, third trimester: Secondary | ICD-10-CM

## 2015-12-06 DIAGNOSIS — O09523 Supervision of elderly multigravida, third trimester: Secondary | ICD-10-CM

## 2015-12-06 LAB — POCT URINALYSIS DIP (DEVICE)
Bilirubin Urine: NEGATIVE
Glucose, UA: NEGATIVE mg/dL
HGB URINE DIPSTICK: NEGATIVE
Ketones, ur: 40 mg/dL — AB
Leukocytes, UA: NEGATIVE
NITRITE: NEGATIVE
PH: 5.5 (ref 5.0–8.0)
Protein, ur: NEGATIVE mg/dL
Specific Gravity, Urine: >=1.03 (ref 1.005–1.030)
UROBILINOGEN UA: 0.2 mg/dL (ref 0.0–1.0)

## 2015-12-06 MED ORDER — TETANUS-DIPHTH-ACELL PERTUSSIS 5-2.5-18.5 LF-MCG/0.5 IM SUSP
0.5000 mL | Freq: Once | INTRAMUSCULAR | Status: AC
Start: 2015-12-06 — End: 2015-12-06
  Administered 2015-12-06: 0.5 mL via INTRAMUSCULAR

## 2015-12-06 MED ORDER — METFORMIN HCL 500 MG PO TABS: 1000.0000 mg | ORAL_TABLET | Freq: Two times a day (BID) | ORAL | 2 refills | Status: DC

## 2015-12-06 MED ORDER — METOCLOPRAMIDE HCL 10 MG PO TABS: 10.0000 mg | ORAL_TABLET | Freq: Four times a day (QID) | ORAL | 2 refills | Status: DC | PRN

## 2015-12-06 MED ORDER — CETIRIZINE HCL 10 MG PO TABS: 10.0000 mg | ORAL_TABLET | Freq: Every day | ORAL | 3 refills | Status: DC

## 2015-12-06 NOTE — Patient Instructions (Signed)
Return to clinic for any scheduled appointments or obstetric concerns, or go to [facility] for evaluation  

## 2015-12-06 NOTE — Addendum Note (Signed)
Addended by: Garret ReddishBARNES, Netasha Wehrli M on: 12/06/2015 11:25 AM   Modules accepted: Orders

## 2015-12-06 NOTE — Progress Notes (Signed)
   PRENATAL VISIT NOTE  Subjective:  Dominique Mora is a 35 y.o. G2P0010 at 9125w2d being seen today for ongoing prenatal care.  She is currently monitored for the following issues for this high-risk pregnancy and has Supervision of high risk elderly multigravida in third trimester; Asthma; PCO (polycystic ovaries); Hypothyroid; Poor dentition; Acid reflux; Diabetes mellitus in pregnancy, antepartum; Advanced maternal age in multigravida; and GAD (generalized anxiety disorder) on her problem list.  Patient reports nausea and vomiting for a few days and constipation. No other symptoms.  Contractions: Not present. Vag. Bleeding: None.  Movement: Present. Denies leaking of fluid.   The following portions of the patient's history were reviewed and updated as appropriate: allergies, current medications, past family history, past medical history, past social history, past surgical history and problem list. Problem list updated.  Objective:   Vitals:   12/06/15 0954  BP: 130/84  Pulse: 92  Weight: 210 lb 8 oz (95.5 kg)    Fetal Status: Fetal Heart Rate (bpm): 140 Fundal Height: 31 cm Movement: Present     General:  Alert, oriented and cooperative. Patient is in no acute distress.  Skin: Skin is warm and dry. No rash noted.   Cardiovascular: Normal heart rate noted  Respiratory: Normal respiratory effort, no problems with respiration noted  Abdomen: Soft, gravid, appropriate for gestational age. Pain/Pressure: Present     Pelvic:  Cervical exam deferred        Extremities: Normal range of motion.  Edema: Trace  Mental Status: Normal mood and affect. Normal behavior. Normal judgment and thought content.   Urinalysis: Urine Protein: Negative Urine Glucose: Negative  Assessment and Plan:  Pregnancy: G2P0010 at 3425w2d  1. Diabetes mellitus in pregnancy, antepartum, third trimester Patient is hesistant and does not wants insulin. Will max out Metformin. Reevaluate in one week. If still elevated  will have no other option but to start insulin. Recommended exercise 30 minutes a day. Continue Glyburide 10 mg bid.  - metFORMIN (GLUCOPHAGE) 500 MG tablet; Take 2 tablets (1,000 mg total) by mouth 2 (two) times daily with a meal.  Dispense: 60 tablet; Refill: 2  2. Nausea and vomiting of pregnancy, antepartum - metoCLOPramide (REGLAN) 10 MG tablet; Take 1 tablet (10 mg total) by mouth 4 (four) times daily as needed for nausea or vomiting.  Dispense: 30 tablet; Refill: 2 Recommended OTC remedies for constipation.  3. Need for Tdap vaccination - Tdap vaccine greater than or equal to 7yo IM  4. Supervision of high risk elderly multigravida in third trimester - cetirizine (ZYRTEC) 10 MG tablet; Take 1 tablet (10 mg total) by mouth daily.  Dispense: 30 tablet; Refill: 3. Refill ordered as per patient. Preterm labor symptoms and general obstetric precautions including but not limited to vaginal bleeding, contractions, leaking of fluid and fetal movement were reviewed in detail with the patient. Please refer to After Visit Summary for other counseling recommendations.  Return in about 1 week (around 12/13/2015) for OB Visit (needs to review blood sugars).  Tereso NewcomerUgonna A Anyanwu, MD

## 2015-12-13 ENCOUNTER — Ambulatory Visit (INDEPENDENT_AMBULATORY_CARE_PROVIDER_SITE_OTHER): Payer: Medicaid Other | Admitting: Obstetrics and Gynecology

## 2015-12-13 VITALS — BP 121/77 | HR 95 | Wt 211.1 lb

## 2015-12-13 DIAGNOSIS — O24913 Unspecified diabetes mellitus in pregnancy, third trimester: Secondary | ICD-10-CM

## 2015-12-13 DIAGNOSIS — O09523 Supervision of elderly multigravida, third trimester: Secondary | ICD-10-CM

## 2015-12-13 NOTE — Patient Instructions (Addendum)
Type 1 or Type 2 Diabetes Mellitus During Pregnancy Diabetes mellitus, often simply referred to as diabetes, is a long-term (chronic) disease. Type 1 diabetes occurs when the islet cells, which are in the pancreas and make the hormone insulin, are destroyed and can no longer make insulin. Type 2 diabetes occurs when the pancreas does not make enough insulin, the cells are less responsive to the insulin that is made (insulin resistance), or both. Insulin is needed to move sugars from food into the tissue cells. The tissue cells use the sugars for energy. The lack of insulin or the lack of normal response to insulin causes excess sugars to build up in the blood instead of going into the tissue cells. As a result, high blood sugar (hyperglycemia) develops.  If blood glucose levels are kept in the normal range both before and during pregnancy, women can have a healthy pregnancy. If your blood glucose levels are not well controlled, there may be risks to you, your unborn baby, and your labor and delivery. Also, there may be risks to your baby once he or she is born.  RISK FACTORS  You are predisposed to developing type 1 diabetes if someone in your family has diabetes and you are exposed to certain environmental triggers.  You have an increased chance of developing type 2 diabetes if you have a family history of diabetes and also have one or more of the following risk factors:  Being overweight.  Having an inactive lifestyle.  Having a history of consistently eating high-calorie foods. SYMPTOMS  Increased thirst (polydipsia).  Increased urination (polyuria).  Increased urination during the night (nocturia).  Weight loss. This weight loss may be rapid.  Frequent, recurring infections.  Tiredness (fatigue).  Weakness.  Vision changes, such as blurred vision.  Fruity smell to your breath.  Abdominal pain.  Nausea or vomiting. DIAGNOSIS  If you have risk factors for diabetes, you may  be screened for undiagnosed type 2 diabetes at your first prenatal visit. If you have previously given birth and you had gestational diabetes, you should be screened. The screening should be performed 6-12 weeks after the child is born and repeated every 1-3 years after the first test. Diabetes is diagnosed when blood glucose levels are increased. Your blood glucose level may be checked by one or more of the following blood tests:  A fasting blood glucose test. You will not be allowed to eat for at least 8 hours before a blood sample is taken.  A random blood glucose test. Your blood glucose is checked at any time of the day regardless of when you ate.  A hemoglobin A1c blood glucose test. A hemoglobin A1c test provides information about blood glucose control over the previous 3 months.  An oral glucose tolerance test (OGTT). Your blood glucose is measured after you have not eaten (fasted) for 1-3 hours and then after you drink a glucose-containing beverage. An OGTT is usually performed during weeks 24-28 of your pregnancy. TREATMENT   You will need to take diabetes medicine or insulin daily to keep blood glucose levels in the desired range.  You will need to match insulin dosing with exercise and healthy food choices. If you have type 1 or type 2 diabetes, your treatment goal is to maintain the following blood glucose levels during pregnancy:  Before meals (preprandial), at bedtime, and overnight: 60-99 mg/dL.  After meals (postprandial): peak of 100-129 mg/dL.  A1c: less than 7%. HOME CARE INSTRUCTIONS   Have your hemoglobin   A1c level checked twice a year.  Perform daily blood glucose monitoring as directed by your health care provider. It is common to perform frequent blood glucose monitoring.  Monitor urine ketones when you are sick and as directed by your health care provider.  Take your diabetes medicine and insulin as directed by your health care provider to maintain your  blood glucose level in the desired range.  Never run out of diabetes medicine or insulin. It is needed every day.  Adjust insulin based on your intake of carbohydrates. Carbohydrates can raise blood glucose levels but need to be included in your diet. Carbohydrates provide vitamins, minerals, and fiber, which are an essential part of a healthy diet. Carbohydrates are found in fruits, vegetables, whole grains, dairy products, legumes, and foods containing added sugars.  Eat healthy foods. Alternate 3 meals with 3 snacks.  Maintain a healthy weight gain. The usual total expected weight gain varies according to your prepregnancy body mass index (BMI).  Carry a medical alert card or wear medical alert jewelry.  Carry a 15-gram carbohydrate snack with you at all times to treat low blood sugar (hypoglycemia). Some examples of 15-gram carbohydrate snacks include:  Glucose tablets, 3 or 4.  Glucose gel, 15-gram tube.  Raisins, 2 Tbsp (24 grams).  Jelly beans, 6.  Animal crackers, 8.  Fruit juice, regular soda, or low-fat milk, 4 ounces (120 mL).  Gummy treats, 9.  Recognize hypoglycemia. Hypoglycemia during pregnancy occurs with blood glucose levels of 60 mg/dL and below. The risk for hypoglycemia increases when fasting or skipping meals, during or after intense exercise, and during sleep. Hypoglycemia symptoms can include:  Tremors or shakes.  Decreased ability to concentrate.  Sweating.  Increased heart rate.  Headache.  Dry mouth.  Hunger.  Irritability.  Anxiety.  Restless sleep.  Altered speech or coordination.  Confusion.  Treat hypoglycemia promptly. If you are alert and able to safely swallow, follow the 15:15 rule:  Take 15-20 grams of rapid-acting glucose or carbohydrate. Rapid-acting options include glucose gel, glucose tablets, or 4 ounces (120 mL) of fruit juice, regular soda, or low-fat milk.  Check your blood glucose level 15 minutes after taking the  glucose.  Take an additional 15-20 grams of glucose if the repeat blood glucose level is still 70 mg/dL or below.  Eat a meal or snack within 1 hour once blood glucose levels return to normal.  Engage in at least 30 minutes of physical activity a day or as directed by your health care provider. Ten minutes of physical activity timed 30 minutes after each meal is encouraged to control postprandial blood glucose levels.  Watch for polyuria (excess urination) and polydipsia (feeling extra thirsty), which are early signs of hyperglycemia. An early awareness of hyperglycemia allows for prompt treatment. Treat hyperglycemia as directed by your health care provider.  Adjust your insulin dosing and food intake, as needed, if you start a new exercise or sport.  Follow your sick-day plan any time you are unable to eat or drink as usual.  Avoid tobacco and alcohol use.  Keep all follow-up visits as directed by your health care provider.  Follow the advice of your health care provider regarding your prenatal and post-delivery (postpartum) appointments, meal planning, exercise, medicines, vitamins, blood tests, other medical tests, and physical activities.  Continue daily skin and foot care. Examine your skin and feet daily for cuts, bruises, redness, nail problems, bleeding, blisters, or sores. A foot exam by a health care provider should   be done annually.  Brush your teeth and gums at least twice a day and floss at least once a day. Follow up with your dentist regularly.  Schedule an eye exam during the first trimester of your pregnancy or as directed by your health care provider.  Share your diabetes management plan with your workplace or school.  Stay up-to-date with immunizations.  Learn to manage stress.  Obtain ongoing diabetes education and support as needed.  Your health care provider may recommend that you take one low-dose aspirin (81 mg) each day to help prevent high blood pressure  during your pregnancy (preeclampsia or eclampsia). You may be at risk for preeclampsia or eclampsia if:  You had preeclampsia or eclampsia during a previous pregnancy.  Your baby did not grow as expected during a previous pregnancy.  You experienced preterm birth with a previous pregnancy.  You experienced a separation of the placenta from the uterus (placental abruption) during a previous pregnancy.  You experienced the loss of your baby during a previous pregnancy.  You are pregnant with more than one baby.  You have other medical conditions, such as high blood pressure or autoimmune disease. SEEK MEDICAL CARE IF:   You are unable to eat food or drink fluids for more than 6 hours.  You have nausea and vomiting for more than 6 hours.  You have a blood glucose level of 200 mg/dL and you have ketones in your urine.  There is a change in mental status.  You develop vision problems.  You have a persistent headache.  You have upper abdominal pain or discomfort.  You have an additional serious sickness.  You have diarrhea for more than 6 hours.  You have been sick or have had a fever for 2 days and are not getting better. SEEK IMMEDIATE MEDICAL CARE IF:  You have difficulty breathing.  You no longer feel your baby moving.  You are bleeding or have discharge from your vagina.  You start having premature contractions or labor. MAKE SURE YOU:  Understand these instructions.  Will watch your condition.  Will get help right away if you are not doing well or get worse.   This information is not intended to replace advice given to you by your health care provider. Make sure you discuss any questions you have with your health care provider.   Document Released: 11/21/2011 Document Revised: 03/19/2014 Document Reviewed: 11/21/2011 Elsevier Interactive Patient Education 2016 [employer]. Constipation, Adult Constipation is when a person has fewer than three bowel  movements a week, has difficulty having a bowel movement, or has stools that are dry, hard, or larger than normal. As people grow older, constipation is more common. A low-fiber diet, not taking in enough fluids, and taking certain medicines may make constipation worse.  CAUSES   Certain medicines, such as antidepressants, pain medicine, iron supplements, antacids, and water pills.   Certain diseases, such as diabetes, irritable bowel syndrome (IBS), thyroid disease, or depression.   Not drinking enough water.   Not eating enough fiber-rich foods.   Stress or travel.   Lack of physical activity or exercise.   Ignoring the urge to have a bowel movement.   Using laxatives too much.  SIGNS AND SYMPTOMS   Having fewer than three bowel movements a week.   Straining to have a bowel movement.   Having stools that are hard, dry, or larger than normal.   Feeling full or bloated.   Pain in the lower abdomen.  Not feeling relief after having a bowel movement.  DIAGNOSIS  Your health care provider will take a medical history and perform a physical exam. Further testing may be done for severe constipation. Some tests may include:  A barium enema X-ray to examine your rectum, colon, and, sometimes, your small intestine.   A sigmoidoscopy to examine your lower colon.   A colonoscopy to examine your entire colon. TREATMENT  Treatment will depend on the severity of your constipation and what is causing it. Some dietary treatments include drinking more fluids and eating more fiber-rich foods. Lifestyle treatments may include regular exercise. If these diet and lifestyle recommendations do not help, your health care provider may recommend taking over-the-counter laxative medicines to help you have bowel movements. Prescription medicines may be prescribed if over-the-counter medicines do not work.  HOME CARE INSTRUCTIONS   Eat foods that have a lot of fiber, such as fruits,  vegetables, whole grains, and beans.  Limit foods high in fat and processed sugars, such as french fries, hamburgers, cookies, candies, and soda.   A fiber supplement may be added to your diet if you cannot get enough fiber from foods.   Drink enough fluids to keep your urine clear or pale yellow.   Exercise regularly or as directed by your health care provider.   Go to the restroom when you have the urge to go. Do not hold it.   Only take over-the-counter or prescription medicines as directed by your health care provider. Do not take other medicines for constipation without talking to your health care provider first.  SEEK IMMEDIATE MEDICAL CARE IF:   You have bright red blood in your stool.   Your constipation lasts for more than 4 days or gets worse.   You have abdominal or rectal pain.   You have thin, pencil-like stools.   You have unexplained weight loss. MAKE SURE YOU:   Understand these instructions.  Will watch your condition.  Will get help right away if you are not doing well or get worse.   This information is not intended to replace advice given to you by your health care provider. Make sure you discuss any questions you have with your health care provider.   Document Released: 11/25/2003 Document Revised: 03/19/2014 Document Reviewed: 12/08/2012 Elsevier Interactive Patient Education [employer]2016 Elsevier Inc.

## 2015-12-13 NOTE — Progress Notes (Signed)
   PRENATAL VISIT NOTE  Subjective:  Dominique Mora is a 35 y.o. G2P0010 at 6271w2d being seen today for ongoing prenatal care.  She is currently monitored for the following issues for this high-risk pregnancy and has Supervision of high risk elderly multigravida in third trimester; Asthma; PCO (polycystic ovaries); Hypothyroid; Poor dentition; Acid reflux; Diabetes mellitus in pregnancy, antepartum; Advanced maternal age in multigravida; and GAD (generalized anxiety disorder) on her problem list.  Patient reports constipation   .  .   . Denies leaking of fluid.   She does not have logbook but we reviewed CBGs on her meter since September 27. Metformin was increased to 1000 twice a day on 26. She is also taking glyburide 10 mg twice a day; however she takes the meds at various times of the day she some days wakes that 7 other days at noon. Fastings for the past week-, 92, 116, 105, 129, 91, 90. 2 hours after breakfast 152-to 10-,-,-177; 2 hours after lunch 102, 118,-,-,-, 84; 2 hours after dinner 150, 107, one-hour 1, 186,-,-  The following portions of the patient's history were reviewed and updated as appropriate: allergies, current medications, past family history, past medical history, past social history, past surgical history and problem list. Problem list updated.  Objective:   Vitals:   12/13/15 1343  BP: 121/77  Pulse: 95  Weight: 211 lb 2 oz (95.8 kg)    Fetal Status:           General:  Alert, oriented and cooperative. Patient is in no acute distress.  Skin: Skin is warm and dry. No rash noted.   Cardiovascular: Normal heart rate noted  Respiratory: Normal respiratory effort, no problems with respiration noted  Abdomen: Soft, gravid, appropriate for gestational age.       Pelvic:  Cervical exam deferred        Extremities: Normal range of motion.     Mental Status: Normal mood and affect. Normal behavior. Normal judgment and thought content.   Urinalysis:      Assessment and  Plan:  Pregnancy: G2P0010 at 8671w2d 1. Supervision of high risk elderly multigravida in third trimester   2. Diabetes mellitus affecting pregnancy in third trimester   Poor control on maximum dosages of oral agents> see Maggie for initiation of insulin S>D: U/S 12/20/15 There are no diagnoses linked to this encounter. Preterm labor symptoms and general obstetric precautions including but not limited to vaginal bleeding, contractions, leaking of fluid and fetal movement were reviewed in detail with the patient. Please refer to After Visit Summary for other counseling recommendations.  Return in about 1 week (around 12/20/2015).  Danae Orleanseirdre C Sarika Baldini, CNM

## 2015-12-20 ENCOUNTER — Ambulatory Visit (HOSPITAL_COMMUNITY)
Admission: RE | Admit: 2015-12-20 | Discharge: 2015-12-20 | Disposition: A | Discharge status: Home / Self Care | Payer: Medicaid Other | Source: Ambulatory Visit | Attending: Obstetrics & Gynecology | Admitting: Obstetrics & Gynecology

## 2015-12-22 ENCOUNTER — Ambulatory Visit (HOSPITAL_COMMUNITY)
Admission: RE | Admit: 2015-12-22 | Discharge: 2015-12-22 | Disposition: A | Discharge status: Home / Self Care | Payer: Medicaid Other | Source: Ambulatory Visit | Attending: Obstetrics & Gynecology | Admitting: Obstetrics & Gynecology

## 2015-12-22 ENCOUNTER — Other Ambulatory Visit (HOSPITAL_COMMUNITY): Payer: Self-pay | Admitting: *Deleted

## 2015-12-22 ENCOUNTER — Encounter (HOSPITAL_COMMUNITY): Payer: Self-pay

## 2015-12-22 DIAGNOSIS — E039 Hypothyroidism, unspecified: Secondary | ICD-10-CM | POA: Diagnosis not present

## 2015-12-22 DIAGNOSIS — Z3A30 30 weeks gestation of pregnancy: Secondary | ICD-10-CM | POA: Insufficient documentation

## 2015-12-22 DIAGNOSIS — O283 Abnormal ultrasonic finding on antenatal screening of mother: Secondary | ICD-10-CM | POA: Diagnosis not present

## 2015-12-22 DIAGNOSIS — O99281 Endocrine, nutritional and metabolic diseases complicating pregnancy, first trimester: Secondary | ICD-10-CM | POA: Insufficient documentation

## 2015-12-22 DIAGNOSIS — O24919 Unspecified diabetes mellitus in pregnancy, unspecified trimester: Secondary | ICD-10-CM

## 2015-12-22 DIAGNOSIS — O24313 Unspecified pre-existing diabetes mellitus in pregnancy, third trimester: Secondary | ICD-10-CM | POA: Diagnosis not present

## 2015-12-22 DIAGNOSIS — Z7984 Long term (current) use of oral hypoglycemic drugs: Principal | ICD-10-CM

## 2015-12-22 DIAGNOSIS — O09513 Supervision of elderly primigravida, third trimester: Secondary | ICD-10-CM | POA: Diagnosis not present

## 2015-12-22 DIAGNOSIS — O4190X0 Disorder of amniotic fluid and membranes, unspecified, unspecified trimester, not applicable or unspecified: Secondary | ICD-10-CM

## 2015-12-22 DIAGNOSIS — O99283 Endocrine, nutritional and metabolic diseases complicating pregnancy, third trimester: Secondary | ICD-10-CM | POA: Diagnosis present

## 2015-12-26 ENCOUNTER — Ambulatory Visit: Payer: Medicaid Other | Admitting: Obstetrics and Gynecology

## 2015-12-26 ENCOUNTER — Encounter: Payer: Medicaid Other | Attending: Obstetrics and Gynecology | Admitting: Dietician

## 2015-12-26 DIAGNOSIS — O24113 Pre-existing diabetes mellitus, type 2, in pregnancy, third trimester: Secondary | ICD-10-CM | POA: Insufficient documentation

## 2015-12-26 MED ORDER — INSULIN NPH (HUMAN) (ISOPHANE) 100 UNIT/ML ~~LOC~~ SUSP: SUBCUTANEOUS | 3 refills | Status: DC

## 2015-12-26 MED ORDER — INSULIN SYRINGES (DISPOSABLE) U-100 1 ML MISC: 0 refills | Status: DC

## 2015-12-26 MED ORDER — INSULIN REGULAR HUMAN 100 UNIT/ML IJ SOLN: 20.0000 [IU] | Freq: Two times a day (BID) | INTRAMUSCULAR | 11 refills | Status: DC

## 2015-12-26 MED ORDER — ALCOHOL SWABS PADS: MEDICATED_PAD | 0 refills | Status: DC

## 2015-12-26 MED ORDER — INSULIN STARTER KIT- SYRINGES (ENGLISH): 1.0000 | Freq: Once | Status: DC

## 2015-12-26 NOTE — Progress Notes (Signed)
Diabetes Education: 12/26/15 Marchelle Folksmanda comes today with a recent h/x of elevated blood glucose levels.  Currently is at maximum dose of Metformin and Glyburide. She is at 31w/1d with and EDD at 02/26/16. Weight is at 95 kg. Glucose levels are not being controlled.  She admits to an erratic eating and exercise schedule.  She notes that she will be unable to improve upon adjusting her eating/meal schedules and exercise. Physicians recommend starting insulin for better glucose control. Recommendations received: NPH 40 units in the AM before breakfast. NPH 15 units at Bedtime. Regular insulin 20 units before Breakfast. Regular insulin 15 units before Dinner. Reviewed insulin curve and s/s of hypoglycemia and its treatment. Review and demonstration of mixing NPH and regular insulin.  Provided Handout "Mixing Two Insulins." Provided demonstration for injecting insulin sub-q into thigh.   She was able to do return demonstration of mixing insulins (saline)  and appropriately injecting using a foam pad.  Althea Grimmeredria Poe, CNM is to enter prescriptions. Will follow as needed. Maggie Dhruv Christina, RN, RD, LDN

## 2015-12-26 NOTE — Addendum Note (Signed)
Addended by: Cheree DittoGRAHAM, Karisma Meiser A on: 12/26/2015 03:06 PM   Modules accepted: Orders

## 2015-12-26 NOTE — Progress Notes (Signed)
Discussed with Maggie. D/C Metformin and glyburide. Will do split dose insulin doses by formula based on 90kg, multiplier 1

## 2015-12-26 NOTE — Addendum Note (Signed)
Addended by: Darrel HooverASSETTE, KELLY P on: 12/26/2015 01:15 PM   Modules accepted: Orders

## 2015-12-29 ENCOUNTER — Ambulatory Visit (HOSPITAL_COMMUNITY)
Admission: RE | Admit: 2015-12-29 | Discharge: 2015-12-29 | Disposition: A | Discharge status: Home / Self Care | Payer: Medicaid Other | Source: Ambulatory Visit | Attending: Obstetrics & Gynecology | Admitting: Obstetrics & Gynecology

## 2015-12-29 ENCOUNTER — Ambulatory Visit (INDEPENDENT_AMBULATORY_CARE_PROVIDER_SITE_OTHER): Payer: Medicaid Other | Admitting: Obstetrics and Gynecology

## 2015-12-29 ENCOUNTER — Encounter (HOSPITAL_COMMUNITY): Payer: Self-pay

## 2015-12-29 VITALS — BP 121/75 | HR 78 | Wt 214.0 lb

## 2015-12-29 DIAGNOSIS — O24419 Gestational diabetes mellitus in pregnancy, unspecified control: Secondary | ICD-10-CM

## 2015-12-29 DIAGNOSIS — O24113 Pre-existing diabetes mellitus, type 2, in pregnancy, third trimester: Secondary | ICD-10-CM

## 2015-12-29 DIAGNOSIS — Z3A31 31 weeks gestation of pregnancy: Secondary | ICD-10-CM | POA: Diagnosis not present

## 2015-12-29 DIAGNOSIS — E031 Congenital hypothyroidism without goiter: Secondary | ICD-10-CM

## 2015-12-29 DIAGNOSIS — O09523 Supervision of elderly multigravida, third trimester: Secondary | ICD-10-CM | POA: Insufficient documentation

## 2015-12-29 DIAGNOSIS — B3731 Acute candidiasis of vulva and vagina: Secondary | ICD-10-CM

## 2015-12-29 DIAGNOSIS — O283 Abnormal ultrasonic finding on antenatal screening of mother: Secondary | ICD-10-CM | POA: Diagnosis present

## 2015-12-29 DIAGNOSIS — O98813 Other maternal infectious and parasitic diseases complicating pregnancy, third trimester: Secondary | ICD-10-CM | POA: Diagnosis not present

## 2015-12-29 DIAGNOSIS — O288 Other abnormal findings on antenatal screening of mother: Secondary | ICD-10-CM

## 2015-12-29 DIAGNOSIS — O24313 Unspecified pre-existing diabetes mellitus in pregnancy, third trimester: Secondary | ICD-10-CM | POA: Insufficient documentation

## 2015-12-29 DIAGNOSIS — B373 Candidiasis of vulva and vagina: Secondary | ICD-10-CM

## 2015-12-29 DIAGNOSIS — O24414 Gestational diabetes mellitus in pregnancy, insulin controlled: Secondary | ICD-10-CM

## 2015-12-29 DIAGNOSIS — O4190X0 Disorder of amniotic fluid and membranes, unspecified, unspecified trimester, not applicable or unspecified: Secondary | ICD-10-CM

## 2015-12-29 LAB — POCT URINALYSIS DIP (DEVICE)
BILIRUBIN URINE: NEGATIVE
Glucose, UA: NEGATIVE mg/dL
HGB URINE DIPSTICK: NEGATIVE
KETONES UR: 40 mg/dL — AB
Leukocytes, UA: NEGATIVE
NITRITE: NEGATIVE
PH: 5.5 (ref 5.0–8.0)
Protein, ur: NEGATIVE mg/dL
Specific Gravity, Urine: 1.025 (ref 1.005–1.030)
Urobilinogen, UA: 0.2 mg/dL (ref 0.0–1.0)

## 2015-12-29 LAB — TSH: TSH: 1.32 mIU/L

## 2015-12-29 MED ORDER — MICONAZOLE NITRATE 2 % VA CREA: 1.0000 | TOPICAL_CREAM | Freq: Every day | VAGINAL | Status: DC

## 2015-12-29 MED ORDER — INSULIN SYRINGES (DISPOSABLE) U-100 1 ML MISC: 1.0000 | Freq: Three times a day (TID) | 3 refills | Status: DC

## 2015-12-29 MED ORDER — ALBUTEROL SULFATE HFA 108 (90 BASE) MCG/ACT IN AERS: 2.0000 | INHALATION_SPRAY | Freq: Four times a day (QID) | RESPIRATORY_TRACT | 2 refills | Status: DC | PRN

## 2015-12-29 NOTE — Progress Notes (Signed)
Prenatal Visit Note Date: 12/29/2015 [facility]  Subjective:  Dominique Mora is a 35 y.o. G2P0010 at 787w4d being seen today for ongoing prenatal care.  She is currently monitored for the following issues for this high-risk pregnancy and has Supervision of high risk elderly multigravida in third trimester; Asthma; PCO (polycystic ovaries); Hypothyroid; Poor dentition; Diabetes mellitus in pregnancy, antepartum; Advanced maternal age in multigravida; GAD (generalized anxiety disorder); and AFI (amniotic fluid index) borderline low on her problem list.  Patient reports vulvovag pruritis. no dysuria. +white d/c but nothing clear or fluid like ever. hasn't tried anything otc. .   Contractions: Irritability. Vag. Bleeding: Scant.  Movement: Present. Denies leaking of fluid.   The following portions of the patient's history were reviewed and updated as appropriate: allergies, current medications, past family history, past medical history, past social history, past surgical history and problem list. Problem list updated.  Objective:   Vitals:   12/29/15 1142 12/29/15 1144 12/29/15 1241  BP: (!) 141/82 121/88 121/75  Pulse: 78    Weight: 214 lb (97.1 kg)      Fetal Status: Fetal Heart Rate (bpm): 136   Movement: Present     General:  Alert, oriented and cooperative. Patient is in no acute distress.  Skin: Skin is warm and dry. No rash noted.   Cardiovascular: Normal heart rate noted  Respiratory: Normal respiratory effort, no problems with respiration noted  Abdomen: Soft, gravid, appropriate for gestational age. Pain/Pressure: Present     Pelvic:  EGBUS with mild b/l vulvar erythema with some white cottage cheese like d/c at the introitus +same d/c in vault, with minimal normal white d/c of pregnancy. cx visually closed and appears long. Negative cough test.   Extremities: Normal range of motion.  Edema: Trace  Mental Status: Normal mood and affect. Normal  behavior. Normal judgment and thought content.   Urinalysis:      Assessment and Plan:  Pregnancy: G2P0010 at 767w4d  1. GDM, class A2 Pt states needs different syringes sent in so this was done and pt aware to go pick up supplies. Pt told to stop glyburide when she starts insulin but to continue on th metformin 1000 bid. Will start 2x/ week testing today. LLN AFI today so will have MFM repeat AFI and BPP in one week. Will have them do instead of us since it's so borderline and they have been doing them anyway. Will do nst today and in 4d in the interim. Will do a1c today. Had growth last week and rpt already scheduled (normal efw, ac with LLN AFI) - US Fetal BPP W/O Non Stress; Future - Insulin Syringes, Disposable, U-100 1 ML MISC; 1 Device by Does not apply route 3 (three) times daily before meals.  Dispense: 100 each; Refill: 3  2. Congenital hypothyroidism without goiter Continue synthroid 50 qday. Hasn't had check in a few months so will do today - TSH  3. Supervision of high risk elderly multigravida in third trimester Routine care. No desire for BTL. Rpt BP normal x 2. Precautions given. Albuterol refill sent in. Using PRN <4x/week only. Will continue to follow   4. Elderly multigravida in third trimester See prior notes. No new plans  5. AFI (amniotic fluid index) borderline low No e/o ROM. Pt See above. [facility] precautions given  rNST today and no decel with mod variabiltty  - Fetal nonstress test  6. Type 2 diabetes mellitus affecting pregnancy in third trimester, antepartum See above  7.  Vulvovaginal candidiasis Monistat 7  Preterm labor symptoms and general obstetric precautions including but not limited to vaginal bleeding, contractions, leaking of fluid and fetal movement were reviewed in detail with the patient. Please refer to After Visit Summary for other counseling recommendations.  Return in about 4 days (around 01/02/2016) for NST only. and 1wk for provider BS log  check.    [place]rier Bing, MD

## 2015-12-29 NOTE — ED Notes (Signed)
Pt reports slight reddish-brown tinge noted on her panties yesterday.

## 2015-12-29 NOTE — Progress Notes (Signed)
Patient reports vaginal irritation and itching

## 2015-12-29 NOTE — Progress Notes (Signed)
Patient requests refill on albuterol

## 2015-12-30 LAB — HEMOGLOBIN A1C
HEMOGLOBIN A1C: 6.2 % — AB (ref <5.7)
Mean Plasma Glucose: 131 mg/dL

## 2016-01-02 ENCOUNTER — Ambulatory Visit (INDEPENDENT_AMBULATORY_CARE_PROVIDER_SITE_OTHER): Payer: Medicaid Other | Admitting: Obstetrics and Gynecology

## 2016-01-02 DIAGNOSIS — O24113 Pre-existing diabetes mellitus, type 2, in pregnancy, third trimester: Secondary | ICD-10-CM | POA: Diagnosis not present

## 2016-01-02 MED ORDER — METFORMIN HCL 500 MG PO TABS: 1000.0000 mg | ORAL_TABLET | Freq: Two times a day (BID) | ORAL | 2 refills | Status: DC

## 2016-01-02 NOTE — Progress Notes (Signed)
Pt stated that since her dose of Metformin was increased she was not able to get refill and she has not been able to take the medication since Saturday morning. New Rx sent to pharmacy - pt advised to obtain medication following appt today and to take all doses as prescribed. She voiced understanding.

## 2016-01-02 NOTE — Progress Notes (Signed)
Reactive NST 

## 2016-01-05 ENCOUNTER — Ambulatory Visit (INDEPENDENT_AMBULATORY_CARE_PROVIDER_SITE_OTHER): Payer: Medicaid Other | Admitting: Obstetrics and Gynecology

## 2016-01-05 VITALS — BP 120/85 | HR 83 | Wt 217.2 lb

## 2016-01-05 DIAGNOSIS — O09523 Supervision of elderly multigravida, third trimester: Secondary | ICD-10-CM

## 2016-01-05 DIAGNOSIS — O24113 Pre-existing diabetes mellitus, type 2, in pregnancy, third trimester: Secondary | ICD-10-CM

## 2016-01-05 DIAGNOSIS — Z3689 Encounter for other specified antenatal screening: Secondary | ICD-10-CM | POA: Diagnosis not present

## 2016-01-05 LAB — POCT URINALYSIS DIP (DEVICE)
Bilirubin Urine: NEGATIVE
GLUCOSE, UA: NEGATIVE mg/dL
HGB URINE DIPSTICK: NEGATIVE
KETONES UR: 15 mg/dL — AB
LEUKOCYTES UA: NEGATIVE
Nitrite: NEGATIVE
Protein, ur: NEGATIVE mg/dL
SPECIFIC GRAVITY, URINE: 1.025 (ref 1.005–1.030)
UROBILINOGEN UA: 0.2 mg/dL (ref 0.0–1.0)
pH: 5.5 (ref 5.0–8.0)

## 2016-01-05 MED ORDER — INSULIN REGULAR HUMAN 100 UNIT/ML IJ SOLN
20.0000 [IU] | Freq: Two times a day (BID) | INTRAMUSCULAR | 11 refills | Status: DC
Start: 2016-01-05 — End: 2016-01-13

## 2016-01-05 NOTE — Progress Notes (Signed)
Prenatal Visit Note Date: 01/05/2016 [facility]  Subjective:  Dominique Mora is a 35 y.o. G2P0010 at 6414w4d being seen today for ongoing prenatal care.  She is currently monitored for the following issues for this high-risk pregnancy and has Supervision of high risk elderly multigravida in third trimester; Asthma; PCO (polycystic ovaries); Hypothyroid; Poor dentition; Diabetes mellitus in pregnancy, antepartum; Advanced maternal age in multigravida; GAD (generalized anxiety disorder); and AFI (amniotic fluid index) borderline low on her problem list.  Patient reports no complaints.   Contractions: Irritability. Vag. Bleeding: None.  Movement: Present. Denies leaking of fluid.   The following portions of the patient's history were reviewed and updated as appropriate: allergies, current medications, past family history, past medical history, past social history, past surgical history and problem list. Problem list updated.  Objective:   Vitals:   01/05/16 0903  BP: 120/85  Pulse: 83  Weight: 217 lb 3.2 oz (98.5 kg)    Fetal Status: Fetal Heart Rate (bpm): rNST   Movement: Present  Presentation: Homero FellersFrank Breech  General:  Alert, oriented and cooperative. Patient is in no acute distress.  Skin: Skin is warm and dry. No rash noted.   Cardiovascular: Normal heart rate noted  Respiratory: Normal respiratory effort, no problems with respiration noted  Abdomen: Soft, gravid, appropriate for gestational age. Pain/Pressure: Present     Pelvic:  Cervical exam deferred        Extremities: Normal range of motion.  Edema: Trace  Mental Status: Normal mood and affect. Normal behavior. Normal judgment and thought content.   Urinalysis: Urine Protein: Negative Urine Glucose: Negative  Assessment and Plan:  Pregnancy: G2P0010 at 3714w4d  1. Type 2 diabetes mellitus affecting pregnancy in third trimester, antepartum -Patient states she ran out of her metformin until 10/23.   She is currently taking metformin 1000/1000 and on AM: NPH 40 mixed with Regular 20 and taken about 15-6376m before breakfast and PM: Regular 15 15-8976m before dinner and NPH 15 qhs. Based on irregular log below, I told her to increase her AM regular to 25 units and to take the regular 20-4976m before her meals. rNST today (125 baseline, +accels, no decel, mod var x 7764m. Fetus on monitor for so long due to great FM and hard to trace at times). A1c on 10/19 was 6.2. Rpt growth u/s on 11/9. Continue with 2x/week testing -10/26  AM fasting 85 -10/25  0730: 125 1330: 96 -10/24 0730: 150 1130: 136 2330: 190 -10/23 075: 98 1053: 173 1600: 111 2353: 103 -10/22 0800: 125 1111: 184 1300: 103 1915: 110 2320: 155 - Amniotic fluid index with NST - insulin regular (HUMULIN R) 100 units/mL injection; Inject 0.2 mLs (20 Units total) into the skin 2 (two) times daily. AM 25 units SQ before breakfast mixed with NPH AC  Dinner 15 units SQ  Dispense: 10 mL; Refill: 11  2. Supervision of high risk elderly multigravida in third trimester Routine care. Not interested in BTL  3. Hypothyroid Normal TSH on 10/19: continue synthroid 50  4. LLN AFI 5.6 today, stable. F/u one week. Continue with 2x/week testing. FKC precautions given.   Preterm labor symptoms and general obstetric precautions including but not limited to vaginal bleeding, contractions, leaking of fluid and fetal movement were reviewed in detail with the patient. Please refer to After Visit Summary for other counseling recommendations.  Return in about 5 days (around 01/10/2016) for NST only.   [place] Bingharlie Kristy Catoe, MD

## 2016-01-05 NOTE — Progress Notes (Signed)
Pt is not taking insulin according to prescribed dosages. She currently takes R - 20 units & N - 40 units @ breakfast; R - 15 units @ dinner; N- 15 units @ HS  Amniotic fluid continues to be low today - stable from 1 week ago.   US for growth scheduled 11/9

## 2016-01-10 ENCOUNTER — Ambulatory Visit (INDEPENDENT_AMBULATORY_CARE_PROVIDER_SITE_OTHER): Payer: Medicaid Other | Admitting: Obstetrics and Gynecology

## 2016-01-10 VITALS — BP 130/77 | HR 74

## 2016-01-10 DIAGNOSIS — O288 Other abnormal findings on antenatal screening of mother: Secondary | ICD-10-CM | POA: Diagnosis not present

## 2016-01-10 DIAGNOSIS — O24113 Pre-existing diabetes mellitus, type 2, in pregnancy, third trimester: Secondary | ICD-10-CM

## 2016-01-10 NOTE — Progress Notes (Signed)
NST Note Date: 01/10/2016 Gestational Age: 36/2 FHT: positive accelerations, negative, decelerations, moderate variability Toco: one UC, +irritability Time: 30 minutes  A/P: rNST. Continue with current plan of care.   Dominique Mora, Jr MD Attending [facility](Faculty Practice)

## 2016-01-13 ENCOUNTER — Ambulatory Visit: Payer: Self-pay

## 2016-01-13 ENCOUNTER — Ambulatory Visit (INDEPENDENT_AMBULATORY_CARE_PROVIDER_SITE_OTHER): Payer: Medicaid Other | Admitting: Family Medicine

## 2016-01-13 VITALS — BP 113/75 | HR 70 | Wt 216.7 lb

## 2016-01-13 DIAGNOSIS — O288 Other abnormal findings on antenatal screening of mother: Secondary | ICD-10-CM | POA: Diagnosis not present

## 2016-01-13 DIAGNOSIS — O09523 Supervision of elderly multigravida, third trimester: Secondary | ICD-10-CM | POA: Diagnosis not present

## 2016-01-13 DIAGNOSIS — O24113 Pre-existing diabetes mellitus, type 2, in pregnancy, third trimester: Secondary | ICD-10-CM

## 2016-01-13 DIAGNOSIS — Z3689 Encounter for other specified antenatal screening: Secondary | ICD-10-CM

## 2016-01-13 MED ORDER — INSULIN NPH (HUMAN) (ISOPHANE) 100 UNIT/ML ~~LOC~~ SUSP: SUBCUTANEOUS | 3 refills | Status: DC

## 2016-01-13 MED ORDER — INSULIN REGULAR HUMAN 100 UNIT/ML IJ SOLN: 20.0000 [IU] | Freq: Two times a day (BID) | INTRAMUSCULAR | 11 refills | Status: DC

## 2016-01-13 NOTE — Progress Notes (Signed)
Subjective:  Dominique Mora is a 35 y.o. G2P0010 at 381w5d being seen today for ongoing prenatal care.  She is currently monitored for the following issues for this high-risk pregnancy and has Supervision of high risk elderly multigravida in third trimester; Asthma; PCO (polycystic ovaries); Hypothyroid; Poor dentition; Diabetes mellitus in pregnancy, antepartum; Advanced maternal age in multigravida; GAD (generalized anxiety disorder); and AFI (amniotic fluid index) borderline low on her problem list.  GDM: Patient taking metformin, NPH 40/15, R 15/15.  Reports no hypoglycemic episodes.  Tolerating medication well Fasting: 85-128 (one elevated to 152 when she forgot to take her qHS NPH) 2hr PP: After breakfast: 86-193, after lunch (110-129). After dinner (94-165; 7 elevated)  Patient reports no complaints.   . Vag. Bleeding: None.  Movement: Present. Denies leaking of fluid.   The following portions of the patient's history were reviewed and updated as appropriate: allergies, current medications, past family history, past medical history, past social history, past surgical history and problem list. Problem list updated.  Objective:   Vitals:   01/13/16 0908  BP: 113/75  Pulse: 70  Weight: 216 lb 11.2 oz (98.3 kg)    Fetal Status: Fetal Heart Rate (bpm): NST   Movement: Present  Presentation: Homero FellersFrank Breech  General:  Alert, oriented and cooperative. Patient is in no acute distress.  Skin: Skin is warm and dry. No rash noted.   Cardiovascular: Normal heart rate noted  Respiratory: Normal respiratory effort, no problems with respiration noted  Abdomen: Soft, gravid, appropriate for gestational age. Pain/Pressure: Present     Pelvic: Vag. Bleeding: None     Cervical exam deferred        Extremities: Normal range of motion.  Edema: None  Mental Status: Normal mood and affect. Normal behavior. Normal judgment and thought content.   Urinalysis:      Assessment and Plan:  Pregnancy: G2P0010  at 481w5d  1. Supervision of high risk elderly multigravida in third trimester NST normal. Pt considering traveling by car to [place] around Thanksgiving. Discussed that not optimal timing, given that she'll be around 36 weeks. Pt to bring particulars next visi.  2. Type 2 diabetes mellitus affecting pregnancy in third trimester, antepartum Increase insulin: NPH: 40 in AM and 20 qHS R: 30 in AM and 20 with dinner Continue metformin. Continue twice weekly testing. US in about 1 week. - Fetal nonstress test - US OB Limited  3. AFI (amniotic fluid index) borderline low AFI 6.5 - Fetal nonstress test - US OB Limited  Preterm labor symptoms and general obstetric precautions including but not limited to vaginal bleeding, contractions, leaking of fluid and fetal movement were reviewed in detail with the patient. Please refer to After Visit Summary for other counseling recommendations.  Return in about 3 days (around 01/16/2016) for NST only.   Levie HeritageJacob J Eila Runyan, DO

## 2016-01-13 NOTE — Progress Notes (Signed)
Pt has questions about traveling .

## 2016-01-14 ENCOUNTER — Inpatient Hospital Stay (HOSPITAL_COMMUNITY)
Admission: AD | Admit: 2016-01-14 | Discharge: 2016-01-19 | DRG: 765 | Disposition: A | Discharge status: Home / Self Care | Payer: Medicaid Other | Source: Ambulatory Visit | Attending: Obstetrics and Gynecology | Admitting: Obstetrics and Gynecology

## 2016-01-14 ENCOUNTER — Encounter (HOSPITAL_COMMUNITY): Payer: Self-pay

## 2016-01-14 DIAGNOSIS — O321XX0 Maternal care for breech presentation, not applicable or unspecified: Principal | ICD-10-CM

## 2016-01-14 DIAGNOSIS — Z720 Tobacco use: Secondary | ICD-10-CM

## 2016-01-14 DIAGNOSIS — Z3A33 33 weeks gestation of pregnancy: Secondary | ICD-10-CM

## 2016-01-14 DIAGNOSIS — O99284 Endocrine, nutritional and metabolic diseases complicating childbirth: Secondary | ICD-10-CM | POA: Diagnosis present

## 2016-01-14 DIAGNOSIS — J45909 Unspecified asthma, uncomplicated: Secondary | ICD-10-CM | POA: Diagnosis present

## 2016-01-14 DIAGNOSIS — E039 Hypothyroidism, unspecified: Secondary | ICD-10-CM | POA: Diagnosis present

## 2016-01-14 DIAGNOSIS — O42913 Preterm premature rupture of membranes, unspecified as to length of time between rupture and onset of labor, third trimester: Secondary | ICD-10-CM | POA: Diagnosis present

## 2016-01-14 DIAGNOSIS — F17210 Nicotine dependence, cigarettes, uncomplicated: Secondary | ICD-10-CM | POA: Diagnosis present

## 2016-01-14 DIAGNOSIS — O9952 Diseases of the respiratory system complicating childbirth: Secondary | ICD-10-CM | POA: Diagnosis present

## 2016-01-14 DIAGNOSIS — O24414 Gestational diabetes mellitus in pregnancy, insulin controlled: Secondary | ICD-10-CM

## 2016-01-14 DIAGNOSIS — O329XX0 Maternal care for malpresentation of fetus, unspecified, not applicable or unspecified: Secondary | ICD-10-CM

## 2016-01-14 DIAGNOSIS — O09523 Supervision of elderly multigravida, third trimester: Secondary | ICD-10-CM

## 2016-01-14 DIAGNOSIS — O42919 Preterm premature rupture of membranes, unspecified as to length of time between rupture and onset of labor, unspecified trimester: Secondary | ICD-10-CM

## 2016-01-14 DIAGNOSIS — O24424 Gestational diabetes mellitus in childbirth, insulin controlled: Secondary | ICD-10-CM | POA: Diagnosis present

## 2016-01-14 DIAGNOSIS — O99334 Smoking (tobacco) complicating childbirth: Secondary | ICD-10-CM | POA: Diagnosis present

## 2016-01-14 DIAGNOSIS — Z794 Long term (current) use of insulin: Secondary | ICD-10-CM

## 2016-01-14 DIAGNOSIS — O42113 Preterm premature rupture of membranes, onset of labor more than 24 hours following rupture, third trimester: Secondary | ICD-10-CM | POA: Diagnosis not present

## 2016-01-14 LAB — CBC
HCT: 37.4 % (ref 36.0–46.0)
Hemoglobin: 12.9 g/dL (ref 12.0–15.0)
MCH: 30 pg (ref 26.0–34.0)
MCHC: 34.5 g/dL (ref 30.0–36.0)
MCV: 87 fL (ref 78.0–100.0)
PLATELETS: 362 10*3/uL (ref 150–400)
RBC: 4.3 MIL/uL (ref 3.87–5.11)
RDW: 14.6 % (ref 11.5–15.5)
WBC: 14 10*3/uL — AB (ref 4.0–10.5)

## 2016-01-14 LAB — RAPID URINE DRUG SCREEN, HOSP PERFORMED
AMPHETAMINES: NOT DETECTED
BARBITURATES: NOT DETECTED
BENZODIAZEPINES: NOT DETECTED
Cocaine: NOT DETECTED
Opiates: NOT DETECTED
Tetrahydrocannabinol: POSITIVE — AB

## 2016-01-14 LAB — WET PREP, GENITAL
Clue Cells Wet Prep HPF POC: NONE SEEN
SPERM: NONE SEEN
Trich, Wet Prep: NONE SEEN
Yeast Wet Prep HPF POC: NONE SEEN

## 2016-01-14 LAB — COMPREHENSIVE METABOLIC PANEL
ALBUMIN: 2.8 g/dL — AB (ref 3.5–5.0)
ALT: 13 U/L — ABNORMAL LOW (ref 14–54)
AST: 21 U/L (ref 15–41)
Alkaline Phosphatase: 68 U/L (ref 38–126)
Anion gap: 9 (ref 5–15)
BUN: 9 mg/dL (ref 6–20)
CHLORIDE: 108 mmol/L (ref 101–111)
CO2: 19 mmol/L — ABNORMAL LOW (ref 22–32)
Calcium: 9.4 mg/dL (ref 8.9–10.3)
Creatinine, Ser: 0.55 mg/dL (ref 0.44–1.00)
GFR calc Af Amer: >60 mL/min (ref >60)
GLUCOSE: 98 mg/dL (ref 65–99)
POTASSIUM: 4.5 mmol/L (ref 3.5–5.1)
Sodium: 136 mmol/L (ref 135–145)
Total Bilirubin: 0.4 mg/dL (ref 0.3–1.2)
Total Protein: 6.5 g/dL (ref 6.5–8.1)

## 2016-01-14 LAB — PROTEIN / CREATININE RATIO, URINE
CREATININE, URINE: 100 mg/dL
Protein Creatinine Ratio: 0.1 mg/mg{Cre} (ref 0.00–0.15)
TOTAL PROTEIN, URINE: 10 mg/dL

## 2016-01-14 LAB — POCT FERN TEST

## 2016-01-14 LAB — ABO/RH: ABO/RH(D): A POS

## 2016-01-14 LAB — TYPE AND SCREEN
ABO/RH(D): A POS
ANTIBODY SCREEN: NEGATIVE

## 2016-01-14 LAB — GLUCOSE, CAPILLARY: Glucose-Capillary: 225 mg/dL — ABNORMAL HIGH (ref 65–99)

## 2016-01-14 MED ORDER — AMOXICILLIN 500 MG PO CAPS: 500.0000 mg | ORAL_CAPSULE | Freq: Three times a day (TID) | ORAL | Status: DC

## 2016-01-14 MED ORDER — PRENATAL MULTIVITAMIN CH: 1.0000 | ORAL_TABLET | Freq: Every day | ORAL | Status: DC

## 2016-01-14 MED ORDER — DOCUSATE SODIUM 100 MG PO CAPS: 100.0000 mg | ORAL_CAPSULE | Freq: Every day | ORAL | Status: DC

## 2016-01-14 MED ORDER — NICOTINE 14 MG/24HR TD PT24
14.0000 mg | MEDICATED_PATCH | Freq: Every day | TRANSDERMAL | Status: DC
  Administered 2016-01-14 – 2016-01-15 (×2): 14 mg via TRANSDERMAL
  Filled 2016-01-14 (×4): qty 1

## 2016-01-14 MED ORDER — ZOLPIDEM TARTRATE 5 MG PO TABS: 5.0000 mg | ORAL_TABLET | Freq: Every evening | ORAL | Status: DC | PRN

## 2016-01-14 MED ORDER — LEVOTHYROXINE SODIUM 50 MCG PO TABS
50.0000 ug | ORAL_TABLET | Freq: Every day | ORAL | Status: DC
  Administered 2016-01-15: 50 ug via ORAL
  Filled 2016-01-14 (×2): qty 1

## 2016-01-14 MED ORDER — BETAMETHASONE SOD PHOS & ACET 6 (3-3) MG/ML IJ SUSP
12.0000 mg | Freq: Once | INTRAMUSCULAR | Status: AC
  Administered 2016-01-14: 12 mg via INTRAMUSCULAR
  Filled 2016-01-14: qty 2

## 2016-01-14 MED ORDER — SODIUM CHLORIDE 0.9 % IV SOLN
2.0000 g | Freq: Four times a day (QID) | INTRAVENOUS | Status: DC
  Administered 2016-01-14 – 2016-01-16 (×7): 2 g via INTRAVENOUS
  Filled 2016-01-14 (×9): qty 2000

## 2016-01-14 MED ORDER — AZITHROMYCIN 250 MG PO TABS
1000.0000 mg | ORAL_TABLET | Freq: Once | ORAL | Status: AC
  Administered 2016-01-14: 1000 mg via ORAL
  Filled 2016-01-14: qty 4

## 2016-01-14 MED ORDER — ACETAMINOPHEN 325 MG PO TABS: 650.0000 mg | ORAL_TABLET | ORAL | Status: DC | PRN

## 2016-01-14 MED ORDER — INSULIN NPH (HUMAN) (ISOPHANE) 100 UNIT/ML ~~LOC~~ SUSP
10.0000 [IU] | Freq: Every day | SUBCUTANEOUS | Status: DC
  Administered 2016-01-14 – 2016-01-15 (×2): 10 [IU] via SUBCUTANEOUS
  Filled 2016-01-14: qty 10

## 2016-01-14 MED ORDER — LACTATED RINGERS IV SOLN
INTRAVENOUS | Status: DC
  Administered 2016-01-14 – 2016-01-16 (×4): via INTRAVENOUS

## 2016-01-14 MED ORDER — INSULIN ASPART 100 UNIT/ML ~~LOC~~ SOLN: 0.0000 [IU] | Freq: Three times a day (TID) | SUBCUTANEOUS | Status: DC

## 2016-01-14 MED ORDER — INSULIN ASPART 100 UNIT/ML ~~LOC~~ SOLN
0.0000 [IU] | SUBCUTANEOUS | Status: DC
  Administered 2016-01-14: 4 [IU] via SUBCUTANEOUS
  Administered 2016-01-15 (×2): 3 [IU] via SUBCUTANEOUS

## 2016-01-14 MED ORDER — LACTATED RINGERS IV BOLUS (SEPSIS)
1000.0000 mL | Freq: Once | INTRAVENOUS | Status: AC
  Administered 2016-01-14: 1000 mL via INTRAVENOUS

## 2016-01-14 MED ORDER — CALCIUM CARBONATE ANTACID 500 MG PO CHEW: 2.0000 | CHEWABLE_TABLET | ORAL | Status: DC | PRN

## 2016-01-14 MED ORDER — BETAMETHASONE SOD PHOS & ACET 6 (3-3) MG/ML IJ SUSP
12.0000 mg | Freq: Once | INTRAMUSCULAR | Status: AC
  Administered 2016-01-15: 12 mg via INTRAMUSCULAR
  Filled 2016-01-14: qty 2

## 2016-01-14 NOTE — MAU Note (Signed)
Panties were soaked when she woke up. Has changed soaked panties twice.  Fluid is clear, no bleeding or pain.

## 2016-01-14 NOTE — H&P (Signed)
ANTEPARTUM ADMISSION HISTORY AND PHYSICAL NOTE   History of Present Illness: Dominique Mora is a 35 y.o. G2P0010 at [redacted]w[redacted]d admitted for PPROM.    Patient reports the fetal movement as active. Patient reports uterine contraction  activity as none. Patient reports  vaginal bleeding as none. Patient describes fluid per vagina as Clear. Fetal presentation is breech.  Patient states this AM when she woke up (about 10AM) she noticed her underwear was soaked. She got up, changed, and went to make breakfast when she noticed her underwear was soaked again and had fluid running down her leg. She changed again and came to MAU, when she arrived here, she had more fluid and soaked underwear. She has not noticed any contractions. Fluid was noted to be clear at home.    Patient Active Problem List   Diagnosis Date Noted  . Preterm premature rupture of membranes (PPROM) with unknown onset of labor 01/14/2016  . AFI (amniotic fluid index) borderline low 12/29/2015  . GAD (generalized anxiety disorder) 11/22/2015  . Advanced maternal age in multigravida 08/16/2015  . Diabetes mellitus in pregnancy, antepartum 07/21/2015  . Asthma 07/12/2015  . PCO (polycystic ovaries) 07/12/2015  . Hypothyroid 07/12/2015  . Poor dentition 07/12/2015  . Supervision of high risk elderly multigravida in third trimester 06/22/2015    Past Medical History:  Diagnosis Date  . Diabetes mellitus without complication (HCC)   . Hypothyroidism     Past Surgical History:  Procedure Laterality Date  . NO PAST SURGERIES      OB History  Gravida Para Term Preterm AB Living  2       1 0  SAB TAB Ectopic Multiple Live Births  1            # Outcome Date GA Lbr Len/2nd Weight Sex Delivery Anes PTL Lv  2 Current           1 SAB 10/2002              Social History   Social History  . Marital status: Single    Spouse name: N/A  . Number of children: N/A  . Years of education: N/A   Social History Main Topics  .  Smoking status: Current Every Day Smoker    Packs/day: 0.25    Types: Cigarettes  . Smokeless tobacco: Never Used  . Alcohol use No  . Drug use: No  . Sexual activity: Yes    Birth control/ protection: None   Other Topics Concern  . None   Social History Narrative  . None    History reviewed. No pertinent family history.  No Known Allergies  Facility-Administered Medications Prior to Admission  Medication Dose Route Frequency Provider Last Rate Last Dose  . miconazole (MONISTAT 7) 2 % vaginal cream 1 Applicatorful  1 Applicatorful Vaginal QHS [place] Bing, MD       Prescriptions Prior to Admission  Medication Sig Dispense Refill Last Dose  . albuterol (PROVENTIL HFA;VENTOLIN HFA) 108 (90 Base) MCG/ACT inhaler Inhale 2 puffs into the lungs every 6 (six) hours as needed for wheezing or shortness of breath. 1 Inhaler 2 Past Month at Unknown time  . aspirin EC 81 MG tablet Take 81 mg by mouth daily.   01/14/2016 at 1030  . cetirizine (ZYRTEC) 10 MG tablet Take 1 tablet (10 mg total) by mouth daily. 30 tablet 3 01/14/2016 at Unknown time  . insulin NPH Human (HUMULIN N,NOVOLIN N) 100 UNIT/ML injection Inject 20-40 Units into the  skin 2 (two) times daily. Pt uses 40 units in the morning and 20 units at bedtime.  Pt mixes her Humulin R and N for her morning dose.   01/14/2016 at Unknown time  . insulin regular (NOVOLIN R,HUMULIN R) 100 units/mL injection Inject 20-30 Units into the skin 2 (two) times daily before a meal. Pt uses 30 units before breakfast and 20 units before dinner.  Pt mixes her Humulin R and N for her morning dose.   01/14/2016 at Unknown time  . levothyroxine (SYNTHROID, LEVOTHROID) 50 MCG tablet Take 1 tablet (50 mcg total) by mouth daily before breakfast. Reported on 07/11/2015 30 tablet 5 01/14/2016 at Unknown time  . metFORMIN (GLUCOPHAGE) 500 MG tablet Take 2 tablets (1,000 mg total) by mouth 2 (two) times daily with a meal. 120 tablet 2 01/14/2016 at Unknown time  .  Prenatal MV-Min-FA-Omega-3 (PRENATAL GUMMIES/DHA & FA) 0.4-32.5 MG CHEW Chew 2 each by mouth daily.   01/14/2016 at Unknown time  . ranitidine (ZANTAC) 150 MG tablet Take 1 tablet (150 mg total) by mouth 2 (two) times daily. 60 tablet 6 01/14/2016 at Unknown time    Review of Systems - Negative except as stated in HPI  Vitals:  BP 137/89   Pulse 85   Temp 98.2 F (36.8 C) (Oral)   Resp 16   Ht 5\' 4"  (1.626 m)   Wt 216 lb 1.8 oz (98 kg)   LMP 04/30/2015   BMI 37.10 kg/m  Physical Examination: CONSTITUTIONAL: Well-developed, well-nourished obese female in no acute distress.  HENT:  Normocephalic, atraumatic EYES: Conjunctivae and EOM are normal. Pupils are equal, round, and reactive to light. No scleral icterus.  NECK: Normal range of motion, supple, no masses SKIN: Skin is warm and dry. No rash noted. Not diaphoretic. No erythema. No pallor. NEUROLGIC: Alert and oriented to person, place, and time. Normal reflexes, muscle tone coordination. No cranial nerve deficit noted. PSYCHIATRIC: Normal mood and affect. Normal behavior. Normal judgment and thought content. CARDIOVASCULAR: Normal heart rate noted, regular rhythm RESPIRATORY: Effort and breath sounds normal, no problems with respiration noted ABDOMEN: Soft, nontender, nondistended, gravid. MUSCULOSKELETAL: Normal range of motion. No edema and no tenderness. 2+ distal pulses. PELVIC: Clear fluid, +pooling. Visually closed to fingertip external cervical os. Normal vaginal mucosa. Normal appearing cervix.   Cervix: Evaluated by sterile speculum exam. and Evaluated by digital exam. and found to be 0.5 cm/ 50%/Floating and fetal presentation is breech. Membranes:ruptured, clear fluid Fetal Monitoring:Baseline: 120 bpm, mod var, +accels, no decels Tocometer: Flat  Labs:  Results for orders placed or performed during the hospital encounter of 01/14/16 (from the past 24 hour(s))  [facility]   Collection Time: 01/14/16 12:40 PM  Result  Value Ref Range   POCT Fern Test      Imaging Studies: [place] [facility] Ob Follow Up  Result Date: 12/22/2015 OBSTETRICAL ULTRASOUND: This exam was performed within a [place] Ultrasound Department. The OB US report was generated in the AS system, and faxed to the ordering physician.  This report is available in the [employer] PACS. See the AS Obstetric US report via the Image Link.  [place] Mfm Ob Limited  Result Date: 12/29/2015 OBSTETRICAL ULTRASOUND: This exam was performed within a [place] Ultrasound Department. The OB US report was generated in the AS system, and faxed to the ordering physician.  This report is available in the [employer] PACS. See the AS Obstetric US report via the Image Link.    Assessment and  Plan: Patient Active Problem List   Diagnosis Date Noted  . Preterm premature rupture of membranes (PPROM) with unknown onset of labor 01/14/2016  . AFI (amniotic fluid index) borderline low 12/29/2015  . GAD (generalized anxiety disorder) 11/22/2015  . Advanced maternal age in multigravida 08/16/2015  . Diabetes mellitus in pregnancy, antepartum 07/21/2015  . Asthma 07/12/2015  . PCO (polycystic ovaries) 07/12/2015  . Hypothyroid 07/12/2015  . Poor dentition 07/12/2015  . Supervision of high risk elderly multigravida in third trimester 06/22/2015  - Elevated BPs on admission  Admit to Antenatal Routine antenatal care Latency antibiotics Betamethasone x2 (first dose given at 1:30pm 01/14/16) Plan for cesarean when in labor Nicotine patch 2hr PP and FBS insulin sliding scale Diabetic diet Cultures sent (wet prep, GBS, Gc/Ct) UDS  PreE labs ( a few elevated BPs )   Jen MowElizabeth Verlon Carcione, DO OB Fellow Faculty Practice, [facility] [place]

## 2016-01-14 NOTE — MAU Note (Signed)
Patient presents with leaking fluid since waking up this morning, denies contractions, frank breech.

## 2016-01-14 NOTE — Progress Notes (Signed)
Patient very high strung at presents upset that she isn't prepared for the baby to come.  Tried to reassure her and calm here because her blood pressure is elevated.

## 2016-01-15 DIAGNOSIS — O329XX0 Maternal care for malpresentation of fetus, unspecified, not applicable or unspecified: Secondary | ICD-10-CM

## 2016-01-15 DIAGNOSIS — Z720 Tobacco use: Secondary | ICD-10-CM

## 2016-01-15 LAB — GLUCOSE, CAPILLARY
GLUCOSE-CAPILLARY: 197 mg/dL — AB (ref 65–99)
Glucose-Capillary: 108 mg/dL — ABNORMAL HIGH (ref 65–99)
Glucose-Capillary: 175 mg/dL — ABNORMAL HIGH (ref 65–99)

## 2016-01-15 LAB — HIV ANTIBODY (ROUTINE TESTING W REFLEX): HIV Screen 4th Generation wRfx: NONREACTIVE

## 2016-01-15 LAB — RPR: RPR: NONREACTIVE

## 2016-01-15 MED ORDER — LORATADINE 10 MG PO TABS
10.0000 mg | ORAL_TABLET | Freq: Every day | ORAL | Status: DC
Start: 2016-01-15 — End: 2016-01-16
  Administered 2016-01-15: 10 mg via ORAL
  Filled 2016-01-15 (×2): qty 1

## 2016-01-15 MED ORDER — ZOLPIDEM TARTRATE 5 MG PO TABS: 5.0000 mg | ORAL_TABLET | Freq: Every evening | ORAL | Status: DC | PRN

## 2016-01-15 MED ORDER — SODIUM CHLORIDE 0.9 % IV BOLUS (SEPSIS)
1000.0000 mL | Freq: Once | INTRAVENOUS | Status: AC
  Administered 2016-01-15: 1000 mL via INTRAVENOUS

## 2016-01-15 MED ORDER — CYCLOBENZAPRINE HCL 5 MG PO TABS
5.0000 mg | ORAL_TABLET | Freq: Three times a day (TID) | ORAL | Status: DC | PRN
  Administered 2016-01-15: 5 mg via ORAL
  Filled 2016-01-15 (×2): qty 1

## 2016-01-15 MED ORDER — BUTORPHANOL TARTRATE 1 MG/ML IJ SOLN
1.0000 mg | Freq: Once | INTRAMUSCULAR | Status: AC
  Administered 2016-01-15: 1 mg via INTRAVENOUS
  Filled 2016-01-15: qty 1

## 2016-01-15 MED ORDER — SOD CITRATE-CITRIC ACID 500-334 MG/5ML PO SOLN
ORAL | Status: AC
  Administered 2016-01-16: 30 mL via ORAL
  Filled 2016-01-15: qty 15

## 2016-01-15 NOTE — Consult Note (Signed)
[place]  Consultation Service: Neonatology   Dr. Rip Harbour has asked for consultation on Dominique Mora with regarding the care of a premature infant at 27. Thank you for inviting [place] to see this patient.   Reason for consult:  Explain the possible complications, the prognosis, and the care of a premature infant at 87 weeks.   Chief complaint: PROM, breech, s/p betamethasone x 2, at [redacted] weeks EGA   I have reviewed the patient's chart and have met with her. I discussed the likely duration fo 2-3 weeks NICU stay since there may be immature respiratory effort and hypoglycemia due to maternal diabetes.  We discussed the low likelihood of RDS but that respiratory support might still be needed.  We discussed the need for breast feeding, the need for pumping given the often limited abilities of 34 week babies to nurse exclusively.  Parents asked about arranging pediatric provider referral for post-discharge care.  Dominique Mora L. Patterson Hammersmith M.D. Neonatology

## 2016-01-15 NOTE — Progress Notes (Signed)
Daily Antepartum Note  Admission Date: 01/14/2016 Current Date: 01/15/2016 6:08 AM  Dominique Mora is a 35 y.o. G2P0010 @ 2544w0d, HD#2, admitted for PPROM @ 33/6 and breech.  Pregnancy complicated by: Patient Active Problem List   Diagnosis Date Noted  . Tobacco abuse 01/15/2016  . Malpresentation of fetus 01/15/2016  . Preterm premature rupture of membranes (PPROM) with unknown onset of labor 01/14/2016  . AFI (amniotic fluid index) borderline low 12/29/2015  . GAD (generalized anxiety disorder) 11/22/2015  . Advanced maternal age in multigravida 08/16/2015  . Diabetes mellitus in pregnancy, antepartum 07/21/2015  . Asthma 07/12/2015  . PCO (polycystic ovaries) 07/12/2015  . Hypothyroid 07/12/2015  . Poor dentition 07/12/2015  . Supervision of high risk elderly multigravida in third trimester 06/22/2015   Overnight/24hr events:  Patient states she started feeling some low belly pains and pressure q8-5856m for about the past hour or so. Per RN, once patient was allowed to eat last night, she had a lot of food and that's why her BS was in the 220s  Subjective:  Patient very anxious and wonders if this is UCs.   Objective:    Current Vital Signs 24h Vital Sign Ranges  T 98.6 F (37 C) Temp  Avg: 98.2 F (36.8 C)  Min: 98 F (36.7 C)  Max: 98.6 F (37 C)  BP 112/62 BP  Min: 112/62  Max: 147/88  HR 96 Pulse  Avg: 87.5  Min: 76  Max: 96  RR 20 Resp  Avg: 18  Min: 16  Max: 20  SaO2 96 % Not Delivered SpO2  Avg: 96 %  Min: 96 %  Max: 96 %       24 Hour I/O Current Shift I/O  Time Ins Outs 11/04 0701 - 11/05 0700 In: 1112.5 [P.O.:960; I.V.:52.5] Out: -  11/04 1901 - 11/05 0700 In: 1062.5 [P.O.:960; I.V.:52.5] Out: -    Patient Vitals for the past 24 hrs:  BP Temp Temp src Pulse Resp SpO2 Height Weight  01/15/16 0024 112/62 98.6 F (37 C) Oral 96 20 96 % - -  01/14/16 2026 129/84 98.1 F (36.7 C) - 91 18 96 % - -  01/14/16 1533 126/81 98 F (36.7 C) Oral 80 18 - - -   01/14/16 1410 130/76 - - 76 - - - -  01/14/16 1249 137/89 - - 85 - - - -  01/14/16 1234 133/89 - - 88 - - - -  01/14/16 1229 147/88 - - 90 - - - -  01/14/16 1222 138/93 98.2 F (36.8 C) Oral 94 16 - 5\' 4"  (1.626 m) 98 kg (216 lb 1.8 oz)   FHT: 130 baseline, +accels, no decel, mod var Toco: q4456m?  Physical exam: General: Well nourished, well developed female in no acute distress. Pt just up to bed from using bathrom Abdomen: gravid, nttp Cardiovascular: S1, S2 normal, no murmur, rub or gallop, regular rate and rhythm Respiratory: CTAB Extremities: no clubbing, cyanosis or edema Skin: Warm and dry.  SVE: 1/75/-1. Pad inspected and slight yellow d/c on it but no blood or pink fluid/still feels breech  Medications: Current Facility-Administered Medications  Medication Dose Route Frequency Provider Last Rate Last Dose  . acetaminophen (TYLENOL) tablet 650 mg  650 mg Oral Q4H PRN [facility] Mumaw, DO      . ampicillin (OMNIPEN) 2 g in sodium chloride 0.9 % 50 mL IVPB  2 g Intravenous Q6H [facility] Mumaw, DO   2 g at 01/15/16  0500   Followed by  . [START ON 01/16/2016] amoxicillin (AMOXIL) capsule 500 mg  500 mg Oral Q8H [facility] Mumaw, DO      . betamethasone acetate-betamethasone sodium phosphate (CELESTONE) injection 12 mg  12 mg Intramuscular Once [facility] Mumaw, DO      . cyclobenzaprine (FLEXERIL) tablet 5 mg  5 mg Oral Q8H PRN [place] Bingharlie Aly Seidenberg, MD   5 mg at 01/15/16 0458  . insulin aspart (novoLOG) injection 0-14 Units  0-14 Units Subcutaneous UD [place] Bingharlie Timmy Bubeck, MD   4 Units at 01/14/16 2248  . insulin NPH Human (HUMULIN N,NOVOLIN N) injection 10 Units  10 Units Subcutaneous QHS [place] Bingharlie Zella Dewan, MD   10 Units at 01/14/16 2303  . lactated ringers infusion   Intravenous Continuous Mora Bingharlie Amma Crear, MD 10 mL/hr at 01/14/16 2045    . levothyroxine (SYNTHROID, LEVOTHROID) tablet 50 mcg  50 mcg Oral QAC breakfast [place] Bingharlie Jerri Glauser, MD      . nicotine  (NICODERM CQ - dosed in mg/24 hours) patch 14 mg  14 mg Transdermal Daily [facility] Mumaw, DO   14 mg at 01/14/16 1619  . sodium citrate-citric acid (ORACIT) 500-334 MG/5ML solution           . zolpidem (AMBIEN) tablet 5 mg  5 mg Oral QHS PRN [facility] ComberElizabeth Woodland [place] AFBMumaw, DO        Labs:   Recent Labs Lab 01/14/16 1400  WBC 14.0*  HGB 12.9  HCT 37.4  PLT 362     Recent Labs Lab 01/14/16 1400  NA 136  K 4.5  CL 108  CO2 19*  BUN 9  CREATININE 0.55  CALCIUM 9.4  PROT 6.5  BILITOT 0.4  ALKPHOS 68  ALT 13*  AST 21  GLUCOSE 98   BS now: 108  Radiology: no new imaging  Assessment & Plan:  Pt stable *Pregnancy: fetal status reassuring *PPROM: will try and get the 2nd BMZ in at 1330 today and try to get at least a 12hour latency period before delivery. Continue latency antibiotics. I told her that I believer her anxiety is contributing to her perceived sensations as the UCs almost seem like she make be having them show on the monitor. Since unsure if they are real or not, will do 1L NS bolus and keep her NPO in case her pains worsen/continue and her cx does change and she needs to move towards delivery *Preterm: see above. NICU aware *GDM: continue above regimen.  *Malpresentation: for c-section. See above *PPx: SCDs, OOB to bathroom *FEN/GI: will keep NPO for now.  *Tobacco abuse: has patch on *Dispo: after delivery  Cornelia Copaharlie Geneva Pallas, Jr. MD Attending [facility](Faculty Practice)

## 2016-01-15 NOTE — Progress Notes (Signed)
OB Attending Pt feels better after IV fluids and pain medication. She denies any ut ctx.  A/P Stable Second dose of celestone today. Will have NICU see pt. Plan for c section in AM d/t breech.

## 2016-01-16 ENCOUNTER — Inpatient Hospital Stay (HOSPITAL_COMMUNITY): Payer: Medicaid Other | Admitting: Anesthesiology

## 2016-01-16 ENCOUNTER — Other Ambulatory Visit: Payer: Self-pay | Admitting: Obstetrics & Gynecology

## 2016-01-16 ENCOUNTER — Encounter (HOSPITAL_COMMUNITY)
Admission: AD | Disposition: A | Discharge status: Home / Self Care | Payer: Self-pay | Source: Ambulatory Visit | Attending: Obstetrics and Gynecology

## 2016-01-16 ENCOUNTER — Encounter (HOSPITAL_COMMUNITY): Payer: Self-pay | Admitting: *Deleted

## 2016-01-16 DIAGNOSIS — O42113 Preterm premature rupture of membranes, onset of labor more than 24 hours following rupture, third trimester: Secondary | ICD-10-CM

## 2016-01-16 DIAGNOSIS — Z3A33 33 weeks gestation of pregnancy: Secondary | ICD-10-CM

## 2016-01-16 DIAGNOSIS — O321XX0 Maternal care for breech presentation, not applicable or unspecified: Secondary | ICD-10-CM

## 2016-01-16 LAB — GLUCOSE, CAPILLARY
GLUCOSE-CAPILLARY: 130 mg/dL — AB (ref 65–99)
Glucose-Capillary: 159 mg/dL — ABNORMAL HIGH (ref 65–99)

## 2016-01-16 LAB — URINE CULTURE

## 2016-01-16 LAB — CULTURE, BETA STREP (GROUP B ONLY)

## 2016-01-16 LAB — GC/CHLAMYDIA PROBE AMP (~~LOC~~) NOT AT ARMC
Chlamydia: NEGATIVE
Neisseria Gonorrhea: NEGATIVE

## 2016-01-16 SURGERY — Surgical Case
Anesthesia: Spinal

## 2016-01-16 MED ORDER — DIPHENHYDRAMINE HCL 25 MG PO CAPS
25.0000 mg | ORAL_CAPSULE | ORAL | Status: DC | PRN
  Administered 2016-01-16: 25 mg via ORAL
  Filled 2016-01-16: qty 1

## 2016-01-16 MED ORDER — KETOROLAC TROMETHAMINE 30 MG/ML IJ SOLN
INTRAMUSCULAR | Status: AC
  Filled 2016-01-16: qty 1

## 2016-01-16 MED ORDER — DIPHENHYDRAMINE HCL 25 MG PO CAPS: 25.0000 mg | ORAL_CAPSULE | Freq: Four times a day (QID) | ORAL | Status: DC | PRN

## 2016-01-16 MED ORDER — ONDANSETRON HCL 4 MG/2ML IJ SOLN: 4.0000 mg | Freq: Three times a day (TID) | INTRAMUSCULAR | Status: DC | PRN

## 2016-01-16 MED ORDER — NALBUPHINE HCL 10 MG/ML IJ SOLN: 5.0000 mg | INTRAMUSCULAR | Status: DC | PRN

## 2016-01-16 MED ORDER — MORPHINE SULFATE (PF) 0.5 MG/ML IJ SOLN
INTRAMUSCULAR | Status: DC | PRN
  Administered 2016-01-16: .2 mg via INTRATHECAL
  Administered 2016-01-16: .3 mg via INTRAVENOUS

## 2016-01-16 MED ORDER — COCONUT OIL OIL
1.0000 "application " | TOPICAL_OIL | Status: DC | PRN
  Filled 2016-01-16: qty 120

## 2016-01-16 MED ORDER — FENTANYL CITRATE (PF) 100 MCG/2ML IJ SOLN
INTRAMUSCULAR | Status: AC
  Filled 2016-01-16: qty 2

## 2016-01-16 MED ORDER — MENTHOL 3 MG MT LOZG: 1.0000 | LOZENGE | OROMUCOSAL | Status: DC | PRN

## 2016-01-16 MED ORDER — BUPIVACAINE IN DEXTROSE 0.75-8.25 % IT SOLN
INTRATHECAL | Status: DC | PRN
  Administered 2016-01-16: 1.6 mL via INTRATHECAL

## 2016-01-16 MED ORDER — MORPHINE SULFATE-NACL 0.5-0.9 MG/ML-% IV SOSY
PREFILLED_SYRINGE | INTRAVENOUS | Status: AC
  Filled 2016-01-16: qty 1

## 2016-01-16 MED ORDER — SOD CITRATE-CITRIC ACID 500-334 MG/5ML PO SOLN
30.0000 mL | Freq: Once | ORAL | Status: AC
  Administered 2016-01-16: 30 mL via ORAL

## 2016-01-16 MED ORDER — KETOROLAC TROMETHAMINE 30 MG/ML IJ SOLN
30.0000 mg | Freq: Four times a day (QID) | INTRAMUSCULAR | Status: AC | PRN
  Administered 2016-01-16: 30 mg via INTRAMUSCULAR

## 2016-01-16 MED ORDER — PHENYLEPHRINE 8 MG IN D5W 100 ML (0.08MG/ML) PREMIX OPTIME
INJECTION | INTRAVENOUS | Status: DC | PRN
  Administered 2016-01-16: 60 ug/min via INTRAVENOUS

## 2016-01-16 MED ORDER — METFORMIN HCL 500 MG PO TABS
1000.0000 mg | ORAL_TABLET | Freq: Two times a day (BID) | ORAL | Status: DC
  Administered 2016-01-17 – 2016-01-19 (×6): 1000 mg via ORAL
  Filled 2016-01-16 (×6): qty 2

## 2016-01-16 MED ORDER — PHENYLEPHRINE 8 MG IN D5W 100 ML (0.08MG/ML) PREMIX OPTIME
INJECTION | INTRAVENOUS | Status: AC
  Filled 2016-01-16: qty 100

## 2016-01-16 MED ORDER — SIMETHICONE 80 MG PO CHEW: 80.0000 mg | CHEWABLE_TABLET | ORAL | Status: DC | PRN

## 2016-01-16 MED ORDER — LACTATED RINGERS IV SOLN: INTRAVENOUS | Status: DC

## 2016-01-16 MED ORDER — NALBUPHINE HCL 10 MG/ML IJ SOLN: 5.0000 mg | Freq: Once | INTRAMUSCULAR | Status: DC | PRN

## 2016-01-16 MED ORDER — SIMETHICONE 80 MG PO CHEW
80.0000 mg | CHEWABLE_TABLET | Freq: Three times a day (TID) | ORAL | Status: DC
  Administered 2016-01-17 – 2016-01-19 (×7): 80 mg via ORAL
  Filled 2016-01-16 (×7): qty 1

## 2016-01-16 MED ORDER — OXYTOCIN 10 UNIT/ML IJ SOLN
INTRAMUSCULAR | Status: AC
  Filled 2016-01-16: qty 4

## 2016-01-16 MED ORDER — ONDANSETRON HCL 4 MG/2ML IJ SOLN
INTRAMUSCULAR | Status: AC
  Filled 2016-01-16: qty 2

## 2016-01-16 MED ORDER — MEPERIDINE HCL 25 MG/ML IJ SOLN
INTRAMUSCULAR | Status: AC
  Filled 2016-01-16: qty 1

## 2016-01-16 MED ORDER — SCOPOLAMINE 1 MG/3DAYS TD PT72
1.0000 | MEDICATED_PATCH | Freq: Once | TRANSDERMAL | Status: DC
  Filled 2016-01-16: qty 1

## 2016-01-16 MED ORDER — SODIUM CHLORIDE 0.9% FLUSH: 3.0000 mL | INTRAVENOUS | Status: DC | PRN

## 2016-01-16 MED ORDER — CEFAZOLIN SODIUM-DEXTROSE 2-4 GM/100ML-% IV SOLN
2.0000 g | Freq: Once | INTRAVENOUS | Status: AC
  Administered 2016-01-16: 2 g via INTRAVENOUS
  Filled 2016-01-16: qty 100

## 2016-01-16 MED ORDER — MEPERIDINE HCL 25 MG/ML IJ SOLN: 6.2500 mg | INTRAMUSCULAR | Status: DC | PRN

## 2016-01-16 MED ORDER — OXYTOCIN 40 UNITS IN LACTATED RINGERS INFUSION - SIMPLE MED: 2.5000 [IU]/h | INTRAVENOUS | Status: AC

## 2016-01-16 MED ORDER — PRENATAL MULTIVITAMIN CH
1.0000 | ORAL_TABLET | Freq: Every day | ORAL | Status: DC
  Administered 2016-01-17: 1 via ORAL
  Filled 2016-01-16 (×2): qty 1

## 2016-01-16 MED ORDER — DIPHENHYDRAMINE HCL 50 MG/ML IJ SOLN: 12.5000 mg | INTRAMUSCULAR | Status: DC | PRN

## 2016-01-16 MED ORDER — OXYTOCIN 10 UNIT/ML IJ SOLN
INTRAMUSCULAR | Status: DC | PRN
  Administered 2016-01-16: 40 [IU] via INTRAVENOUS

## 2016-01-16 MED ORDER — NALOXONE HCL 0.4 MG/ML IJ SOLN: 0.4000 mg | INTRAMUSCULAR | Status: DC | PRN

## 2016-01-16 MED ORDER — ACETAMINOPHEN 325 MG PO TABS: 650.0000 mg | ORAL_TABLET | ORAL | Status: DC | PRN

## 2016-01-16 MED ORDER — TETANUS-DIPHTH-ACELL PERTUSSIS 5-2.5-18.5 LF-MCG/0.5 IM SUSP: 0.5000 mL | Freq: Once | INTRAMUSCULAR | Status: DC

## 2016-01-16 MED ORDER — MEPERIDINE HCL 25 MG/ML IJ SOLN
INTRAMUSCULAR | Status: DC | PRN
  Administered 2016-01-16 (×2): 12.5 mg via INTRAVENOUS

## 2016-01-16 MED ORDER — NICOTINE 21 MG/24HR TD PT24
21.0000 mg | MEDICATED_PATCH | Freq: Every day | TRANSDERMAL | Status: DC
  Administered 2016-01-16 – 2016-01-19 (×4): 21 mg via TRANSDERMAL
  Filled 2016-01-16 (×4): qty 1

## 2016-01-16 MED ORDER — KETOROLAC TROMETHAMINE 30 MG/ML IJ SOLN: 30.0000 mg | Freq: Four times a day (QID) | INTRAMUSCULAR | Status: AC | PRN

## 2016-01-16 MED ORDER — WITCH HAZEL-GLYCERIN EX PADS: 1.0000 "application " | MEDICATED_PAD | CUTANEOUS | Status: DC | PRN

## 2016-01-16 MED ORDER — LACTATED RINGERS IV SOLN
INTRAVENOUS | Status: DC | PRN
  Administered 2016-01-16: 07:00:00 via INTRAVENOUS

## 2016-01-16 MED ORDER — DIBUCAINE 1 % RE OINT: 1.0000 "application " | TOPICAL_OINTMENT | RECTAL | Status: DC | PRN

## 2016-01-16 MED ORDER — OXYCODONE HCL 5 MG PO TABS
5.0000 mg | ORAL_TABLET | ORAL | Status: DC | PRN
  Administered 2016-01-17 – 2016-01-19 (×5): 5 mg via ORAL
  Filled 2016-01-16 (×6): qty 1

## 2016-01-16 MED ORDER — SIMETHICONE 80 MG PO CHEW
80.0000 mg | CHEWABLE_TABLET | ORAL | Status: DC
  Administered 2016-01-17 – 2016-01-18 (×3): 80 mg via ORAL
  Filled 2016-01-16 (×3): qty 1

## 2016-01-16 MED ORDER — FENTANYL CITRATE (PF) 100 MCG/2ML IJ SOLN
INTRAMUSCULAR | Status: DC | PRN
  Administered 2016-01-16: 40 ug via INTRAVENOUS
  Administered 2016-01-16: 10 ug via INTRATHECAL
  Administered 2016-01-16: 50 ug via INTRAVENOUS

## 2016-01-16 MED ORDER — OXYCODONE HCL 5 MG PO TABS
10.0000 mg | ORAL_TABLET | ORAL | Status: DC | PRN
  Administered 2016-01-17 – 2016-01-19 (×3): 10 mg via ORAL
  Filled 2016-01-16 (×3): qty 2

## 2016-01-16 MED ORDER — IBUPROFEN 600 MG PO TABS
600.0000 mg | ORAL_TABLET | Freq: Four times a day (QID) | ORAL | Status: DC
  Administered 2016-01-17 – 2016-01-19 (×9): 600 mg via ORAL
  Filled 2016-01-16 (×9): qty 1

## 2016-01-16 MED ORDER — NALOXONE HCL 2 MG/2ML IJ SOSY
1.0000 ug/kg/h | PREFILLED_SYRINGE | INTRAVENOUS | Status: DC | PRN
  Filled 2016-01-16: qty 2

## 2016-01-16 MED ORDER — ONDANSETRON HCL 4 MG/2ML IJ SOLN
INTRAMUSCULAR | Status: DC | PRN
  Administered 2016-01-16: 4 mg via INTRAVENOUS

## 2016-01-16 MED ORDER — BUPIVACAINE HCL (PF) 0.5 % IJ SOLN
INTRAMUSCULAR | Status: AC
  Filled 2016-01-16: qty 30

## 2016-01-16 MED ORDER — SENNOSIDES-DOCUSATE SODIUM 8.6-50 MG PO TABS
2.0000 | ORAL_TABLET | ORAL | Status: DC
  Administered 2016-01-17 – 2016-01-18 (×3): 2 via ORAL
  Filled 2016-01-16 (×3): qty 2

## 2016-01-16 MED ORDER — ZOLPIDEM TARTRATE 5 MG PO TABS: 5.0000 mg | ORAL_TABLET | Freq: Every evening | ORAL | Status: DC | PRN

## 2016-01-16 SURGICAL SUPPLY — 42 items
BENZOIN TINCTURE PRP APPL 2/3 (GAUZE/BANDAGES/DRESSINGS) ×3 IMPLANT
CHLORAPREP W/TINT 26ML (MISCELLANEOUS) ×3 IMPLANT
CLAMP CORD UMBIL (MISCELLANEOUS) IMPLANT
CLOSURE STERI STRIP 1/2 X4 (GAUZE/BANDAGES/DRESSINGS) ×3 IMPLANT
CLOTH BEACON ORANGE TIMEOUT ST (SAFETY) ×3 IMPLANT
DECANTER SPIKE VIAL GLASS SM (MISCELLANEOUS) ×3 IMPLANT
DRAPE C SECTION CLR SCREEN (DRAPES) ×3 IMPLANT
DRSG OPSITE POSTOP 4X10 (GAUZE/BANDAGES/DRESSINGS) ×3 IMPLANT
ELECT REM PT RETURN 9FT ADLT (ELECTROSURGICAL) ×3
ELECTRODE REM PT RTRN 9FT ADLT (ELECTROSURGICAL) ×1 IMPLANT
EXTRACTOR VACUUM M CUP 4 TUBE (SUCTIONS) IMPLANT
EXTRACTOR VACUUM M CUP 4' TUBE (SUCTIONS)
GLOVE BIO SURGEON STRL SZ7.5 (GLOVE) ×3 IMPLANT
GLOVE BIOGEL PI IND STRL 7.0 (GLOVE) ×1 IMPLANT
GLOVE BIOGEL PI INDICATOR 7.0 (GLOVE) ×2
GOWN STRL REUS W/TWL 2XL LVL3 (GOWN DISPOSABLE) ×3 IMPLANT
GOWN STRL REUS W/TWL LRG LVL3 (GOWN DISPOSABLE) ×6 IMPLANT
KIT ABG SYR 3ML LUER SLIP (SYRINGE) IMPLANT
NEEDLE HYPO 22GX1.5 SAFETY (NEEDLE) ×3 IMPLANT
NEEDLE HYPO 25X5/8 SAFETYGLIDE (NEEDLE) IMPLANT
NS IRRIG 1000ML POUR BTL (IV SOLUTION) ×3 IMPLANT
PACK C SECTION WH (CUSTOM PROCEDURE TRAY) ×3 IMPLANT
PAD ABD 8X7 1/2 STERILE (GAUZE/BANDAGES/DRESSINGS) ×6 IMPLANT
PAD OB MATERNITY 4.3X12.25 (PERSONAL CARE ITEMS) ×3 IMPLANT
PENCIL SMOKE EVAC W/HOLSTER (ELECTROSURGICAL) ×3 IMPLANT
RTRCTR C-SECT PINK 25CM LRG (MISCELLANEOUS) ×3 IMPLANT
STRIP CLOSURE SKIN 1/2X4 (GAUZE/BANDAGES/DRESSINGS) ×2 IMPLANT
SUT CHROMIC 1 CTX 36 (SUTURE) ×6 IMPLANT
SUT VIC AB 1 CT1 27 (SUTURE) ×4
SUT VIC AB 1 CT1 27XBRD ANTBC (SUTURE) ×2 IMPLANT
SUT VIC AB 2-0 CT1 (SUTURE) ×3 IMPLANT
SUT VIC AB 2-0 CT1 27 (SUTURE) ×2
SUT VIC AB 2-0 CT1 TAPERPNT 27 (SUTURE) ×1 IMPLANT
SUT VIC AB 3-0 CT1 27 (SUTURE) ×4
SUT VIC AB 3-0 CT1 TAPERPNT 27 (SUTURE) ×2 IMPLANT
SUT VIC AB 3-0 SH 27 (SUTURE) ×2
SUT VIC AB 3-0 SH 27X BRD (SUTURE) ×1 IMPLANT
SUT VIC AB 4-0 KS 27 (SUTURE) ×3 IMPLANT
SYR BULB IRRIGATION 50ML (SYRINGE) ×3 IMPLANT
SYR CONTROL 10ML LL (SYRINGE) ×3 IMPLANT
TOWEL OR 17X24 6PK STRL BLUE (TOWEL DISPOSABLE) ×3 IMPLANT
TRAY FOLEY CATH SILVER 14FR (SET/KITS/TRAYS/PACK) ×3 IMPLANT

## 2016-01-16 NOTE — Anesthesia Preprocedure Evaluation (Addendum)
Anesthesia Evaluation  Patient identified by MRN, date of birth, ID band Patient awake    Reviewed: Allergy & Precautions, NPO status , Patient's Chart, lab work & pertinent test results  Airway Mallampati: II  TM Distance: >3 FB     Dental   Pulmonary asthma , Current Smoker,    breath sounds clear to auscultation       Cardiovascular negative cardio ROS   Rhythm:Regular Rate:Normal     Neuro/Psych negative neurological ROS     GI/Hepatic negative GI ROS, Neg liver ROS,   Endo/Other  diabetes, GestationalHypothyroidism   Renal/GU negative Renal ROS     Musculoskeletal   Abdominal   Peds  Hematology negative hematology ROS (+)   Anesthesia Other Findings   Reproductive/Obstetrics (+) Pregnancy                             Lab Results  Component Value Date   WBC 14.0 (H) 01/14/2016   HGB 12.9 01/14/2016   HCT 37.4 01/14/2016   MCV 87.0 01/14/2016   PLT 362 01/14/2016   Lab Results  Component Value Date   CREATININE 0.55 01/14/2016   BUN 9 01/14/2016   NA 136 01/14/2016   K 4.5 01/14/2016   CL 108 01/14/2016   CO2 19 (L) 01/14/2016    Anesthesia Physical Anesthesia Plan  ASA: II  Anesthesia Plan: Spinal   Post-op Pain Management:    Induction:   Airway Management Planned: Natural Airway  Additional Equipment:   Intra-op Plan:   Post-operative Plan:   Informed Consent: I have reviewed the patients History and Physical, chart, labs and discussed the procedure including the risks, benefits and alternatives for the proposed anesthesia with the patient or authorized representative who has indicated his/her understanding and acceptance.   Dental advisory given  Plan Discussed with: CRNA  Anesthesia Plan Comments:         Anesthesia Quick Evaluation

## 2016-01-16 NOTE — Progress Notes (Signed)
OB Attending Pt without complaints this morning. Nervous about surgery. + FM PE       No change  BS 130  A/P IUP 34 1/7 wks       PPROM       Mal presentation       GDM   Pt is S/P BMZ. For c section this Am. R/B/Post op care have been reviewed with pt. NPO since midnight. Ancef ordered For csection this AM.

## 2016-01-16 NOTE — Transfer of Care (Signed)
Immediate Anesthesia Transfer of Care Note  Patient: Dominique Mora  Procedure(s) Performed: Procedure(s): CESAREAN SECTION (N/A)  Patient Location: PACU  Anesthesia Type:Spinal  Level of Consciousness: awake, alert , oriented and patient cooperative  Airway & Oxygen Therapy: Patient Spontanous Breathing  Post-op Assessment: Report given to RN and Post -op Vital signs reviewed and stable  Post vital signs: Reviewed and stable  Last Vitals:  Vitals:   01/15/16 1941 01/16/16 0430  BP: 117/76 (!) 107/55  Pulse: 90 68  Resp: 20 18  Temp: 36.8 C 36.9 C    Last Pain:  Vitals:   01/16/16 0601  TempSrc:   PainSc: 0-No pain      Patients Stated Pain Goal: 0 (01/15/16 0620)  Complications: No apparent anesthesia complications

## 2016-01-16 NOTE — Addendum Note (Signed)
Addendum  created 01/16/16 2022 by Algis GreenhouseLinda A Keniya Schlotterbeck, CRNA   Charge Capture section accepted, Sign clinical note

## 2016-01-16 NOTE — Op Note (Signed)
Cesarean Section Procedure Note  01/14/2016 - 01/16/2016  3:48 PM  PATIENT:  Dominique LevinAmanda Scott  35 y.o. female  PRE-OPERATIVE DIAGNOSIS:  Primary Cesarean Section due to Breech presentation  POST-OPERATIVE DIAGNOSIS:  Primary Cesarean Section due to Breech presentation  PROCEDURE:  Procedure(s): CESAREAN SECTION (N/A)  SURGEON:  Surgeon(s) and Role:    * Hermina StaggersMichael L Ervin, MD - Primary     *Ernestina PennaNicholas Schenk MD- Fellow   ANESTHESIA:   spinal  EBL:  Total I/O In: 2200 [I.V.:2200] Out: 700 [Urine:200; Blood:500]  BLOOD ADMINISTERED:none  DRAINS: none   LOCAL MEDICATIONS USED:  NONE  SPECIMEN:  No Specimen  DISPOSITION OF SPECIMEN:  N/A   Procedure Details   The patient was seen in the Holding Room. The risks, benefits, complications, treatment options, and expected outcomes were discussed with the patient.  The patient concurred with the proposed plan, giving informed consent.  The site of surgery properly noted/marked. The patient was taken to Operating Room # 1, identified as Dominique Levinmanda Scott and the procedure verified as C-Section Delivery. A Time Out was held and the above information confirmed.  After induction of anesthesia, the patient was draped and prepped in the usual sterile manner. A Pfannenstiel incision was made and carried down through the subcutaneous tissue to the fascia. Fascial incision was made and extended transversely. The fascia was separated from the underlying rectus tissue superiorly and inferiorly. The peritoneum was identified and entered. Peritoneal incision was extended longitudinally.  A low transverse uterine incision was made. Delivered from breech frank presentation was a 2160 gram Female with Apgar scores of 9 at one minute and 10 at five minutes. After the umbilical cord was clamped and cut cord blood was obtained for evaluation. The placenta was removed intact and appeared normal. The uterine outline, tubes and ovaries appeared normal. The uterine incision  was closed with running locked sutures of 0 Chromic. A second imbricating layer of  0 Chromic. Hemostasis was observed. The fascia was then reapproximated with running sutures of 0-Vicryl. The skin was reapproximated with 4-0 Vicryl.  Instrument, sponge, and needle counts were correct prior the abdominal closure and at the conclusion of the case.   Complications:  None; patient tolerated the procedure well.  COUNTS:  YES  PATIENT DISPOSITION:  PACU - hemodynamically stable.   Delay start of Pharmacological VTE agent (>24hrs) due to surgical blood loss or risk of bleeding: yes             Disposition: PACU - hemodynamically stable.         Condition: stable   Lorne SkeensNicholas Michael Schenk, MD 01/16/2016 3:48 PM

## 2016-01-16 NOTE — Anesthesia Postprocedure Evaluation (Signed)
Anesthesia Post Note  Patient: Dominique Mora  Procedure(s) Performed: Procedure(s) (LRB): CESAREAN SECTION (N/A)  Patient location during evaluation: Mother Baby Anesthesia Type: Spinal Level of consciousness: awake Pain management: satisfactory to patient Vital Signs Assessment: post-procedure vital signs reviewed and stable Respiratory status: spontaneous breathing Cardiovascular status: stable Anesthetic complications: no     Last Vitals:  Vitals:   01/16/16 1054 01/16/16 1147  BP: 123/74 138/69  Pulse: 81 60  Resp: 18 18  Temp: 36.8 C 36.8 C    Last Pain:  Vitals:   01/16/16 1147  TempSrc: Oral  PainSc:    Pain Goal: Patients Stated Pain Goal: 0 (01/15/16 16100620)               Cephus ShellingBURGER,Yarima Penman

## 2016-01-16 NOTE — Progress Notes (Signed)
Pt refused Ambien for sleep. Pt stated " I wants Stadol" and that she made the request to the previous nurse.  Pt was informed that the MD had ordered Ambien and she responded ..." I prefer to do without".

## 2016-01-16 NOTE — Anesthesia Postprocedure Evaluation (Signed)
Anesthesia Post Note  Patient: Dominique Mora  Procedure(s) Performed: Procedure(s) (LRB): CESAREAN SECTION (N/A)  Patient location during evaluation: PACU Anesthesia Type: Spinal Level of consciousness: awake and alert Pain management: pain level controlled Vital Signs Assessment: post-procedure vital signs reviewed and stable Respiratory status: spontaneous breathing and respiratory function stable Cardiovascular status: blood pressure returned to baseline and stable Postop Assessment: spinal receding Anesthetic complications: no     Last Vitals:  Vitals:   01/16/16 0940 01/16/16 0945  BP:  122/78  Pulse: 61 66  Resp: 18 (!) 23  Temp:      Last Pain:  Vitals:   01/16/16 0930  TempSrc:   PainSc: 7    Pain Goal: Patients Stated Pain Goal: 0 (01/15/16 0620)               Kennieth RadFitzgerald, [name] E

## 2016-01-16 NOTE — Lactation Note (Addendum)
This note was copied from a baby's chart. Lactation Consultation Note  Patient Name: Dominique Mora ZOXWR'UToday's Date: 01/16/2016 Reason for consult: Initial assessment;NICU baby;Infant < 6lbs;Late preterm infant   Initial assessment for first time mom of LPT infant in NICU. Mom is currently pumping for second time today. Reviewed Providing Milk for Your Baby in NICU. Discussed supply and demand. Enc mom to pump every 2-3 hours with a 4-5 hour stretch at night to rest. Showed mom how to use Initiate setting and enc her to use until milk is coming in. Breast milk storage for NICU infant reviewed. Mom was given # stickers and colostrum collection containers. History significant for Hypothyroidism and GDM.   Mom pumped and a glistening was noted in pump flange. Showed mom how to hand express and she returned demo, 1 gtt colostrum obtained and collected from left breast, none from right. Colostrum was sent to NICU.   Mom with large compressible breasts and thick areola. Nipples are semi erect and flatten with stimulation. Mom is concerned with infant receiving bottles. Discussed the importance of calories and allowing the infant to get bigger and stronger for oral feedings and that infant will receive bottles in NICU. Discussed LPT infant feeding behavior and that infant may take several weeks to learn to BF effectively. Mom voiced understanding.   BF Resources Handout and LC Brochure given, mom informed of IP/OP Services, BF Support Groups and LC phone #. Follow up tomorrow and prn.   [facility]. She does not have a pump at home. Discussed WIC pump loaner program. [facility]      Maternal Data Formula Feeding for Exclusion: No Has patient been taught Hand Expression?: Yes Does the patient have breastfeeding experience prior to this delivery?: No  Feeding    LATCH Score/Interventions                      Lactation Tools  Discussed/Used WIC Program: Yes Pump Review: Setup, frequency, and cleaning;Milk Storage Initiated by:: Pump by RN, remainder by Noralee StainSharon Jersee Winiarski, RN IBCLC Date initiated:: 01/16/16   Consult Status Consult Status: Follow-up Date: 01/17/16 Follow-up type: In-patient    Dominique Mora 01/16/2016, 2:59 PM

## 2016-01-17 LAB — CBC
HEMATOCRIT: 30.8 % — AB (ref 36.0–46.0)
HEMOGLOBIN: 10.4 g/dL — AB (ref 12.0–15.0)
MCH: 29.5 pg (ref 26.0–34.0)
MCHC: 33.8 g/dL (ref 30.0–36.0)
MCV: 87.3 fL (ref 78.0–100.0)
Platelets: 355 10*3/uL (ref 150–400)
RBC: 3.53 MIL/uL — AB (ref 3.87–5.11)
RDW: 15 % (ref 11.5–15.5)
WBC: 16.8 10*3/uL — ABNORMAL HIGH (ref 4.0–10.5)

## 2016-01-17 LAB — GLUCOSE, CAPILLARY
GLUCOSE-CAPILLARY: 97 mg/dL (ref 65–99)
Glucose-Capillary: 101 mg/dL — ABNORMAL HIGH (ref 65–99)
Glucose-Capillary: 117 mg/dL — ABNORMAL HIGH (ref 65–99)

## 2016-01-17 MED ORDER — LEVOTHYROXINE SODIUM 50 MCG PO TABS
50.0000 ug | ORAL_TABLET | Freq: Every day | ORAL | Status: DC
  Administered 2016-01-17 – 2016-01-19 (×3): 50 ug via ORAL
  Filled 2016-01-17 (×3): qty 1

## 2016-01-17 MED ORDER — PNEUMOCOCCAL VAC POLYVALENT 25 MCG/0.5ML IJ INJ
0.5000 mL | INJECTION | INTRAMUSCULAR | Status: AC
  Administered 2016-01-19: 0.5 mL via INTRAMUSCULAR
  Filled 2016-01-17 (×2): qty 0.5

## 2016-01-17 NOTE — Progress Notes (Signed)
Subjective: Postpartum Day 1: Cesarean Delivery Patient reports incisional pain, tolerating PO and no problems voiding.    Objective: Vital signs in last 24 hours: Temp:  [97.8 F (36.6 C)-98.2 F (36.8 C)] 97.8 F (36.6 C) (11/07 0500) Pulse Rate:  [57-70] 57 (11/07 0500) Resp:  [14-20] 16 (11/07 0500) BP: (98-118)/(43-70) 118/70 (11/07 0500) SpO2:  [96 %-99 %] 98 % (11/07 0500)  Physical Exam:  General: alert, cooperative and appears stated age Lochia: appropriate Uterine Fundus: firm Incision: dressing in place DVT Evaluation: Negative Homan's sign.   Recent Labs  01/14/16 1400 01/17/16 0549  HGB 12.9 10.4*  HCT 37.4 30.8*    Assessment/Plan: Status post Cesarean section. Doing well postoperatively.  Continue current care. Breast feeding No contraception desired  Reva Boresanya S Craven Crean 01/17/2016, 12:17 PM

## 2016-01-17 NOTE — Lactation Note (Signed)
This note was copied from a baby's chart. Lactation Consultation Note  Patient Name: Dominique Mora WUJWJ'XToday's Date: 01/17/2016 Reason for consult: Follow-up assessment;NICU baby;Infant < 6lbs   Follow up with mom of 26 hour old LPT NICU infant. Mom attempting to get a nap. Mom reports she is pumping every 2.5 hours. She reports she is not getting any volume at this time. Enc her to continue pumping and reviewed what to expect with pumping and milk coming to volume. Enc mom to rest between visits and pumping. Infant is feeding in NICU and mom is going up to hold infant STS. Follow up tomorrow and prn.    Maternal Data Formula Feeding for Exclusion: No Has patient been taught Hand Expression?: Yes Does the patient have breastfeeding experience prior to this delivery?: No  Feeding    LATCH Score/Interventions                      Lactation Tools Discussed/Used WIC Program: Yes Pump Review: Setup, frequency, and cleaning   Consult Status Consult Status: Follow-up Date: 01/18/16 Follow-up type: In-patient    Dominique Mora 01/17/2016, 10:07 AM

## 2016-01-18 LAB — GLUCOSE, CAPILLARY
GLUCOSE-CAPILLARY: 92 mg/dL (ref 65–99)
GLUCOSE-CAPILLARY: 170 mg/dL — AB (ref 65–99)
Glucose-Capillary: 82 mg/dL (ref 65–99)

## 2016-01-18 NOTE — Progress Notes (Signed)
Post Partum Day 2 s/p cesarean for breech /PPROM /  Subjective: no complaints, up ad lib, voiding, tolerating PO and + flatus CBG's have been acceptable on po metformin Objective: Blood pressure 133/82, pulse 88, temperature 98.6 F (37 C), temperature source Oral, resp. rate 18, height 5\' 4"  (1.626 m), weight 98 kg (216 lb 1.8 oz), last menstrual period 04/30/2015, SpO2 97 %, unknown if currently breastfeeding.  Physical Exam: pt missed as she was off floor, phone triage done this pm General:  Lochia:  Uterine Fundus:  Incision:  DVT Evaluation:    Recent Labs  01/17/16 0549  HGB 10.4*  HCT 30.8*    Assessment/Plan: Plan for discharge tomorrow   LOS: 4 days   Dominique Mora 01/18/2016, 9:38 PM  Patient ID: Dominique Mora, female   DOB: 07/23/1980, 35 y.o.   MRN: [id number]

## 2016-01-18 NOTE — Lactation Note (Signed)
This note was copied from a baby's chart. Lactation Consultation Note  Patient Name: Dominique Creta Levinmanda Scott WUJWJ'XToday's Date: 01/18/2016 Reason for consult: Follow-up assessment;NICU baby  NICU baby 5554 hours old. Mom reports that she is still not seeing much at the breast. Mom pumping one breast at a time because she says she wants one hand free. Discussed the benefits of pumping both breast simultaneously. Discussed using a sports bra for hands free pumping. Mom states that she is pumping every 2-3 hours, and has started using #27 flanges and coconut oil and is experiencing increased comfort. Discussed progression of milk coming to volume and the need to stimulate breasts at least 8 times/24 hours followed by hand expression. Mom able to hand express a drop from left breast and it was collected in colostrum container. Parents report that the baby has had one drop of colostrum already. Mom given a hand pump with review. [facility] and [facility], and enc to call [place] [facility] in the morning to discuss an appointment.    Maternal Data Has patient been taught Hand Expression?: Yes  Feeding Feeding Type: Formula Length of feed: 30 min  LATCH Score/Interventions                      Lactation Tools Discussed/Used Tools: Pump Breast pump type: Double-Electric Breast Pump   Consult Status Consult Status: Follow-up Date: 01/19/16 Follow-up type: In-patient    Sherlyn Hay D  Payes 01/18/2016, 2:20 PM

## 2016-01-19 ENCOUNTER — Ambulatory Visit (HOSPITAL_COMMUNITY): Payer: Medicaid Other

## 2016-01-19 ENCOUNTER — Other Ambulatory Visit: Payer: Self-pay | Admitting: Obstetrics & Gynecology

## 2016-01-19 LAB — GLUCOSE, CAPILLARY
GLUCOSE-CAPILLARY: 122 mg/dL — AB (ref 65–99)
Glucose-Capillary: 91 mg/dL (ref 65–99)

## 2016-01-19 MED ORDER — ACETAMINOPHEN 325 MG PO TABS: 650.0000 mg | ORAL_TABLET | ORAL | 0 refills | Status: DC | PRN

## 2016-01-19 MED ORDER — SENNOSIDES-DOCUSATE SODIUM 8.6-50 MG PO TABS: 2.0000 | ORAL_TABLET | ORAL | 0 refills | Status: DC

## 2016-01-19 MED ORDER — DOCUSATE SODIUM 100 MG PO CAPS: 100.0000 mg | ORAL_CAPSULE | Freq: Two times a day (BID) | ORAL | 2 refills | Status: DC | PRN

## 2016-01-19 MED ORDER — IBUPROFEN 600 MG PO TABS: 600.0000 mg | ORAL_TABLET | Freq: Four times a day (QID) | ORAL | 0 refills | Status: DC

## 2016-01-19 MED ORDER — METFORMIN HCL 500 MG PO TABS: 1000.0000 mg | ORAL_TABLET | Freq: Two times a day (BID) | ORAL | 2 refills | Status: DC

## 2016-01-19 MED ORDER — OXYCODONE HCL 5 MG PO TABS: 5.0000 mg | ORAL_TABLET | Freq: Four times a day (QID) | ORAL | 0 refills | Status: DC | PRN

## 2016-01-19 NOTE — Discharge Instructions (Addendum)
Cesarean Delivery, Care After °Refer to this sheet in the next few weeks. These instructions provide you with information on caring for yourself after your procedure. Your health care provider may also give you specific instructions. Your treatment has been planned according to current medical practices, but problems sometimes occur. Call your health care provider if you have any problems or questions after you go home. °HOME CARE INSTRUCTIONS  °· Only take over-the-counter or prescription medications as directed by your health care provider. °· Do not drink alcohol, especially if you are breastfeeding or taking medication to relieve pain. °· Do not chew or smoke tobacco. °· Continue to use good perineal care. Good perineal care includes: °¨ Wiping your perineum from front to back. °¨ Keeping your perineum clean. °· Check your surgical cut (incision) daily for increased redness, drainage, swelling, or separation of skin. °· Clean your incision gently with soap and water every day, and then pat it dry. If your health care provider says it is okay, leave the incision uncovered. Use a bandage (dressing) if the incision is draining fluid or appears irritated. If the adhesive strips across the incision do not fall off within 7 days, carefully peel them off. °· Hug a pillow when coughing or sneezing until your incision is healed. This helps to relieve pain. °· Do not use tampons or douche until your health care provider says it is okay. °· Shower, wash your hair, and take tub baths as directed by your health care provider. °· Wear a well-fitting bra that provides breast support. °· Limit wearing support panties or control-top hose. °· Drink enough fluids to keep your urine clear or pale yellow. °· Eat high-fiber foods such as whole grain cereals and breads, brown rice, beans, and fresh fruits and vegetables every day. These foods may help prevent or relieve constipation. °· Resume activities such as climbing stairs,  driving, lifting, exercising, or traveling as directed by your health care provider. °· Talk to your health care provider about resuming sexual activities. This is dependent upon your risk of infection, your rate of healing, and your comfort and desire to resume sexual activity. °· Try to have someone help you with your household activities and your newborn for at least a few days after you leave the hospital. °· Rest as much as possible. Try to rest or take a nap when your newborn is sleeping. °· Increase your activities gradually. °· Keep all of your scheduled postpartum appointments. It is very important to keep your scheduled follow-up appointments. At these appointments, your health care provider will be checking to make sure that you are healing physically and emotionally. °SEEK MEDICAL CARE IF:  °· You are passing large clots from your vagina. Save any clots to show your health care provider. °· You have a foul smelling discharge from your vagina. °· You have trouble urinating. °· You are urinating frequently. °· You have pain when you urinate. °· You have a change in your bowel movements. °· You have increasing redness, pain, or swelling near your incision. °· You have pus draining from your incision. °· Your incision is separating. °· You have painful, hard, or reddened breasts. °· You have a severe headache. °· You have blurred vision or see spots. °· You feel sad or depressed. °· You have thoughts of hurting yourself or your newborn. °· You have questions about your care, the care of your newborn, or medications. °· You are dizzy or light-headed. °· You have a rash. °· You   have pain, redness, or swelling at the site of the removed intravenous access (IV) tube.  You have nausea or vomiting.  You stopped breastfeeding and have not had a menstrual period within 12 weeks of stopping.  You are not breastfeeding and have not had a menstrual period within 12 weeks of delivery.  You have a fever. SEEK  IMMEDIATE MEDICAL CARE IF:  You have persistent pain.  You have chest pain.  You have shortness of breath.  You faint.  You have leg pain.  You have stomach pain.  Your vaginal bleeding saturates 2 or more sanitary pads in 1 hour. MAKE SURE YOU:   Understand these instructions.  Will watch your condition.  Will get help right away if you are not doing well or get worse.   This information is not intended to replace advice given to you by your health care provider. Make sure you discuss any questions you have with your health care provider.   Document Released: 11/18/2001 Document Revised: 03/19/2014 Document Reviewed: 10/24/2011 Elsevier Interactive Patient Education 2016 [employer]   [facility] Resource Guide (Revised August 2014)   Chronic Pain Problems:   [facility]:  [phone]           Patients need to be referred by their primary care doctor/specialist  Insufficient Money for Medicine:           United Way: call "211"    MAP Program at [facility] - [place] [phone] or [place] [phone]            No Primary Care Doctor:  To locate a primary care doctor that accepts your insurance or provides certain services:           [facility] Connect: [phone]           Physician Referral Service: [phone] ask for My [place]  If no insurance, you need to see if you qualify for [facility] orange card, call to set      up appointment for eligibility/enrollment at [phone] or [phone] or visit [facility]. of Health and [employer] ([address], [place] and [address] -[place]) to meet with a [facility] Healthcare[facility] enrollment specialist.  Agencies that provide inexpensive (sliding fee scale) medical care:       [facility] - Family Medicine at [facility] - [phone]     [facility]  -  [facility] [phone]- [phone]     [facility] -  [phone]     [facility] - [phone]     [facility] [phone]     [facility] [phone]  [facility] - [facility] @ [place] [phone]  [facility] - [facility] @ [place] - [phone]  [facility]: [phone]   Women's Clinic: [phone]   Planned Parenthood: [phone]   [facility] [place]- [phone]    Medicaid-accepting [facility] Providers:           [facility] [phone]- [phone] (No Family Planning accepted)          [address], [phone], Mon-Fri 9am-5pm          [facility] - [phone]  [address], [address], Mon-Thursday 8am-5pm, Fri  8am-noon  [facility] - [phone]          [address], [address], Mon-Fri 7:30am-4:30pm          [facility] - [phone]          [address] [place] [address], [place]-Fri 8am-5pm          [facility] - [phone]          [address], [address]          Only accepts [place] Goldman Sachsccess Medicaid patients after they have their name applied to their card  Self Pay (no insurance) in [facility]:           Sickle Cell Patients:   [address], [phone] [facility]:  [address], [place] [phone]       [facility]  [address] [place], [place] [phone]  [facility]:  [address], [phone]          [facility]           [address], [phone] [facility] for Children  [address], [phone]           [facility] [place]           [address], [address], [place] [phone]        [facility] - [address], [address]            [phone], Mon-Fri 9am-7pm, [place] 9am-1pm          [facility] - Family Medicine @ [facility]          [address], [phone]          [facility] - [facility]           [address], [phone] [facility] - [facility] - [place]  [address], [place] [phone]          [facility]           [address] [place] [address], [phone]  [facility] - [facility]   [address], [phone] [facility] - [facility]  [address], [phone]  Dr. Julio Sickssei-Bonsu           [address], [address], [place], [phone]          [facility]           [address], [phone]          [facility]             [address], [phone]          [facility]           [address], [phone], 1st & 3rd Saturday every month, 10am-1pm  OTHERS:  [employer]  (Immigration Access Clinic Only)  [phone] (Thursday only)  Strategies for finding a Primary Care Provider:  1) Find a Doctor and Pay Out of Pocket  Although you won't have to find out who is covered by your insurance plan, it is a good idea to ask  around and get recommendations. You will then need to call the office and see if the doctor you have chosen will accept you as a new patient and what types of options they offer for patients who are self-pay. Some doctors offer discounts or will set up payment plans for their patients who do not have insurance, but you will need to ask so you aren't surprised when you get to your appointment.  2) Contact [place] [employer] - To see if you qualify for orange card access to healthcare safety net providers.  Call for appointment for eligibility/enrollment at [phone] or [phone]. (Uninsured, 0-200% FPL, qualifying info)   Applicants for [facility] are first required to see if they are eligible to enroll in the [facility] Marketplace before enrolling in [facility] (and get an exemption if they are not).  GCCN Criteria for acceptance is:  ? Proof of ACA Marketing exemption - form or documentation  ? Valid photo ID (driver's license, state identification card, passport, home country ID)  ? Proof of [facility] residency (e.g. drivers license, lease/landlord information, pay stubs with address, utility bill, bank statement, etc.)  ? Proof of income (1040, last year's tax return, W2, 4 current pay stubs, other income proof)  ? Proof of assets (current bank statement + 3 most recent, disability paperwork, life insurance info, tax value on autos, etc.)  3) Contact Your Local Health Department  Not all health departments have doctors that can see patients for sick visits, but many do, so it is worth a call to see if yours does. If you don't know where your local health department is, you can check in your phone book. The CDC also has a tool to help you locate your state's health department, and many state websites also have listings of all of their local health departments.  4) Find a Walk-in Clinic  If your illness is not likely to be very severe or complicated, you may want to try a walk in clinic. These are popping up all over the country in pharmacies, drugstores, and shopping centers. They're usually staffed by nurse practitioners or physician assistants that have been trained to treat common illnesses and complaints. They're usually fairly quick and inexpensive. However, if you have serious medical issues or chronic medical problems, these are probably not your best option   STD Testing:           [facility] [place], [place] [facility]           [address] [place], phone [phone] or [phone]           Monday - Friday, call for an appointment          [facility] Department of  [facility], [place] [facility]           [address], [place], phone [phone] or [phone]           Monday - Friday, call for an appointment Abuse/Neglect:           [facility] Child Abuse Hotline: 515-761-1654           [facility] Child Abuse Hotline: 216-103-0874 (After Hours)  Emergency Shelter:  [facility] [phone]  [facility] [place]- [phone]  [facility] [place] - [phone]  Youth Focus - Act Together - [phone] (ages 43-17)  Homeless Day Shelter @ [employer] - [phone]   Mammograms - Free at  BCCCP - [place] [phone]  Maternity Homes:           Room at the [place] of the [facility]: [phone]   (Homeless mother with children)          [facility]bec[facility] Alert Services: [phone] (Mothers only)  Youth Focus: 9562044480 (Pregnant 93-38 years old)  Adopt a Mom -([phone]  [facility] Resources   [facility] - Lanae Boast  [address], [place] [phone]          [facility]           [address] [place]           [phone]          [place]           [address] [place], [place]           [phone]          [facility] Dept.           [address], [place]           [phone]          [facility] Mental Health           [phone]          [facility]           [phone]          [facility] in [place]           [address]           [phone], [facility] Child Abuse Hotline           [phone]           [phone] (After Hours)  Behavioral Health Services /Substance Abuse Resources:           Alcohol and Drug Services: [phone]           [facility]: [phone]          [facility]: [phone]    Narcotics Helpline - 2601191152          [facility]: [phone]           Residential & Outpatient Substance Abuse Program - [place]: [phone]  [employer]  [facility]al [facility] Wellness Center - [phone] Psychological Services:          [facility]: [phone]   Therapeutic Alternatives: [phone]          [facility] Mental Health           [address] [place]           ACCESS LINE: [phone]     (24 Hour)  Mobile Crisis:   HELPLINES:  Financial risk analyst on Mental Illness - [place] [phone] [facility] on Mental Illness - [place] [phone]  Walk In [facility] - [address] - [place]  [phone]       [facility] - [phone] or [phone]  [facility] - [phone] [address] - [employer] [phone]  [facility] [phone]. [address], [place] [phone]   Dental Assistance:  If unable to pay  or uninsured, contact: [facility]. to become qualified for the adult dental clinic. Patient must be enrolled in [facility] (uninsured, 0-200% FPL, qualifying info).  Enroll in [facility] first, then see Primary Care Physician assigned to you, the PCP makes a dental referral. [facility] will receive referral and contacts patient for appointment.  Patients with Medicaid           [address] [phone]  [facility] (Children up to 20 + Pregnant Women) - [phone]  [facility] - [address] - Suite [phone]  If unable to pay, or uninsured: contact [facility] Department [phone] in [place] - (Children only + Pregnant Women), [phone] in [facility] Children only) to become qualified for the adult dental clinic  Must see if eligible to enroll in [facility] Marketplace before enrolling into the [facility] (exemption required)  [phone] for an appointment)  [email or web address];   [phone].  If not eligible for ACA, then go by Department of Health and Human Services to see if eligible for orange card.  [address] [place] and [phone]  Once you get an orange card, you will have a Primary Care home who will then refer you to dental if needed.        Other IT consultant:   [facility] [phone] (ext [phone])   [address]  Dr. Lawrence Marseilles - [phone]   [address]    [place] - [phone]   [facility]           [address] [place], [place], [place], [address]           [phone], Ext. [phone]           2nd and 4th Thursday of the month at 6:30am (Simple extractions only - no wisdom teeth or surgery) First come/First serve -First 10 clients served           [facility] [place], [place] and [place] residents only)          [address] [place], [place], [place], [address]           [phone]                    [facility] Health Department           [phone]          [facility] Health Department          [phone]         [facility] Health Department - [facility]          [phone]   Transportation Options:  Ambulance - 911 - [phone] per ride Family Member to accompany patient (if stable) [place] [employer] - [phone]  PART - [phone]  Taxi - [phone] - [place]  SCAT - [phone] (Application required)  [facility] - [phone]

## 2016-01-19 NOTE — Progress Notes (Signed)
Pt discharged with written instructions. Pt verbalizes an understanding. No concerns noted at this time. Carmelina DaneERRI L Mykah Bellomo, RN

## 2016-01-19 NOTE — Discharge Summary (Signed)
OB Discharge Summary     Patient Name: Dominique Mora DOB: 11/19/1980 MRN: [id number]  Date of admission: 01/14/2016 Delivering MD: Hermina StaggersERVIN, MICHAEL L   Date of discharge: 01/19/2016  Admitting diagnosis: 34WKS, LEAKING Breech Intrauterine pregnancy: [id number]     Secondary diagnosis:  Active Problems:   Preterm premature rupture of membranes (PPROM) with unknown onset of labor   Tobacco abuse   Malpresentation of fetus  Additional problems: None     Discharge diagnosis: Preterm Pregnancy Delivered and GDM B                                                                                             Post partum procedures:None  Augmentation: None  Complications: None  Hospital course:  PLTCS for PPROM and Breech presentation  Physical exam  Vitals:   01/18/16 1530 01/18/16 1900 01/18/16 2132 01/19/16 0500  BP: 127/87 129/82 133/82 126/71  Pulse: 97 86 88 80  Resp: 20 18 18 18   Temp: 98.9 F (37.2 C) 98.5 F (36.9 C) 98.6 F (37 C) 98.5 F (36.9 C)  TempSrc: Oral Oral Oral Oral  SpO2: 99% 99% 97%   Weight:      Height:       General: alert, cooperative and no distress Lochia: appropriate Uterine Fundus: firm Incision: Healing well with no significant drainage, No significant erythema, Dressing is clean, dry, and intact DVT Evaluation: No evidence of DVT seen on physical exam. Labs: Lab Results  Component Value Date   WBC 16.8 (H) 01/17/2016   HGB 10.4 (L) 01/17/2016   HCT 30.8 (L) 01/17/2016   MCV 87.3 01/17/2016   PLT 355 01/17/2016   CMP Latest Ref Rng & Units 01/14/2016  Glucose 65 - 99 mg/dL 98  BUN 6 - 20 mg/dL 9  Creatinine 0.860.44 - 5.781.00 mg/dL 4.690.55  Sodium 629135 - 528145 mmol/L 136  Potassium 3.5 - 5.1 mmol/L 4.5  Chloride 101 - 111 mmol/L 108  CO2 22 - 32 mmol/L 19(L)  Calcium 8.9 - 10.3 mg/dL 9.4  Total Protein 6.5 - 8.1 g/dL 6.5  Total Bilirubin 0.3 - 1.2 mg/dL 0.4  Alkaline Phos 38 - 126 U/L 68  AST 15 - 41 U/L 21  ALT 14 - 54 U/L 13(L)     Discharge instruction: per After Visit Summary and "Baby and Me Booklet".  After visit meds:    Medication List    STOP taking these medications   insulin NPH Human 100 UNIT/ML injection Commonly known as:  HUMULIN N,NOVOLIN N   insulin regular 100 units/mL injection Commonly known as:  NOVOLIN R,HUMULIN R     TAKE these medications   acetaminophen 325 MG tablet Commonly known as:  TYLENOL Take 2 tablets (650 mg total) by mouth every 4 (four) hours as needed (for pain scale < 4).   albuterol 108 (90 Base) MCG/ACT inhaler Commonly known as:  PROVENTIL HFA;VENTOLIN HFA Inhale 2 puffs into the lungs every 6 (six) hours as needed for wheezing or shortness of breath.   aspirin EC 81 MG tablet Take 81 mg by mouth daily.   cetirizine 10 MG  tablet Commonly known as:  ZYRTEC Take 1 tablet (10 mg total) by mouth daily.   ibuprofen 600 MG tablet Commonly known as:  ADVIL,MOTRIN Take 1 tablet (600 mg total) by mouth every 6 (six) hours.   levothyroxine 50 MCG tablet Commonly known as:  SYNTHROID, LEVOTHROID Take 1 tablet (50 mcg total) by mouth daily before breakfast. Reported on 07/11/2015   metFORMIN 500 MG tablet Commonly known as:  GLUCOPHAGE Take 2 tablets (1,000 mg total) by mouth 2 (two) times daily with a meal.   PRENATAL GUMMIES/DHA & FA 0.4-32.5 MG Chew Chew 2 each by mouth daily.   ranitidine 150 MG tablet Commonly known as:  ZANTAC Take 1 tablet (150 mg total) by mouth 2 (two) times daily.   senna-docusate 8.6-50 MG tablet Commonly known as:  Senokot-S Take 2 tablets by mouth daily. Start taking on:  01/20/2016       Diet: carb modified diet  Activity: Advance as tolerated. Pelvic rest for 6 weeks.   Outpatient follow up:6 weeks Follow up Appt: Future Appointments Date Time Provider Department Center  02/22/2016 8:40 AM Hermina Staggers, MD [facility]   Follow up Visit:No Follow-up on file.  Postpartum contraception: Condoms   Postpartum  medications:  Pt not taking any medications for diabetes pre-pregnancy but was prescribed Metformin 1000 mg BID.  D/C home on Metformin 1000 mg BID with plan for pt to take CBGs and notify office if become too low, need to adjust medication dose or D/C medicine.  Also D/C home with Ibuprofen, Percocet, Colace.  Pt given resources list to obtain primary care. She plans to apply for regular Medicaid so she can continue routine medical care.  She has refills of other maintenance medications, including synthroid, at this time.     Medication List    STOP taking these medications   aspirin EC 81 MG tablet   insulin NPH Human 100 UNIT/ML injection Commonly known as:  HUMULIN N,NOVOLIN N   insulin regular 100 units/mL injection Commonly known as:  NOVOLIN R,HUMULIN R     TAKE these medications   acetaminophen 325 MG tablet Commonly known as:  TYLENOL Take 2 tablets (650 mg total) by mouth every 4 (four) hours as needed (for pain scale < 4).   albuterol 108 (90 Base) MCG/ACT inhaler Commonly known as:  PROVENTIL HFA;VENTOLIN HFA Inhale 2 puffs into the lungs every 6 (six) hours as needed for wheezing or shortness of breath.   cetirizine 10 MG tablet Commonly known as:  ZYRTEC Take 1 tablet (10 mg total) by mouth daily.   docusate sodium 100 MG capsule Commonly known as:  COLACE Take 1 capsule (100 mg total) by mouth 2 (two) times daily as needed.   ibuprofen 600 MG tablet Commonly known as:  ADVIL,MOTRIN Take 1 tablet (600 mg total) by mouth every 6 (six) hours.   levothyroxine 50 MCG tablet Commonly known as:  SYNTHROID, LEVOTHROID Take 1 tablet (50 mcg total) by mouth daily before breakfast. Reported on 07/11/2015   metFORMIN 500 MG tablet Commonly known as:  GLUCOPHAGE Take 2 tablets (1,000 mg total) by mouth 2 (two) times daily with a meal.   oxyCODONE 5 MG immediate release tablet Commonly known as:  Oxy IR/ROXICODONE Take 1-2 tablets (5-10 mg total) by mouth every 6 (six)  hours as needed (pain scale 4-7).   PRENATAL GUMMIES/DHA & FA 0.4-32.5 MG Chew Chew 2 each by mouth daily.   ranitidine 150 MG tablet Commonly known as:  ZANTAC Take 1 tablet (150 mg total) by mouth 2 (two) times daily.   senna-docusate 8.6-50 MG tablet Commonly known as:  Senokot-S Take 2 tablets by mouth daily. Start taking on:  01/20/2016       Newborn Data: Live born female  Birth Weight: 4 lb 12.2 oz (2160 g) APGAR: 9, 10  Baby Feeding: Breast Disposition:NICU   01/19/2016 Deniece ReeJosue D Santos, MD

## 2016-01-19 NOTE — Lactation Note (Addendum)
This note was copied from a baby's chart. Lactation Consultation Note  Patient Name: Dominique Mora VWUJW'JToday's Date: 01/19/2016 Reason for consult: Follow-up assessment;NICU baby NICU baby 975 hours old. Mom is engorged so assisted with icing, manual expression and hand expression. Milk is flowing, but slowly. Enc mom to keep icing, massaging and pumping to keep the milk flowing. Mom is tight on outer aspects of breasts and has some knotty areas. Discussed how to massage and get the milk flowing. Had to reiterate teaching several times. Enc using engorgement treatment measures until breasts no longer swollen and tight--and enc to pump if her breasts wake her from sleep. Mom aware of OP/BFSG and LC phone line assistance after D/C. Mom is picking up her WIC pump today, 01-19-16 at 1pm. Mom reports that she is taking Synthroid, so enc mom to make sure that her HCP follows up with monitoring her thyroid hormone, and discussed how hormone levels impact breast milk production.  Maternal Data    Feeding Feeding Type: Breast Milk with Formula added Length of feed: 60 min (auto increase feed: 35ml)  LATCH Score/Interventions                      Lactation Tools Discussed/Used     Consult Status Consult Status: PRN    Sherlyn HayJennifer D Durante Violett 01/19/2016, 11:47 AM

## 2016-01-19 NOTE — Clinical Social Work Maternal (Signed)
CLINICAL SOCIAL WORK MATERNAL/CHILD NOTE  Patient Details  Name: Dominique Mora MRN: [id number] Date of Birth: [birth year]  Date:  01/19/2016  Clinical Social Worker Initiating Note:  Ferdinand Lango Gustavus Haskin, MSW, LCSW-A  Date/ Time Initiated:  01/19/16/1123     Child's Name:  Dominique Mora    Legal Guardian:  Other (Comment) (Not established by court system; MOB and FOB parenting collectively )   Need for Interpreter:  None   Date of Referral:  01/17/16     Reason for Referral:  Current Substance Use/Substance Use During Pregnancy    Referral Source:  NICU   Address:  [place], [place]  Phone number:  [phone]   Household Members:  Self, Spouse, Minor Children   Natural Supports (not living in the home):  Immediate Family, Friends   Medical illustrator Supports: Therapist   Employment: Unemployed   Type of Work: [employer]    Education:  9 to 11 years   Museum/gallery curator Resources:  Medicaid   Other Resources:  [facility]   Cultural/Religious Considerations Which May Impact Care:  None reported per face sheet.   Strengths:  Home prepared for child , Ability to meet basic needs , Compliance with medical plan , Understanding of illness   Risk Factors/Current Problems:  Substance Use , DHHS Involvement    Cognitive State:  Alert , Able to Concentrate , Goal Oriented , Insightful    Mood/Affect:  Comfortable , Interested , Happy , Bright    CSW Assessment: CSW met with MOB at bedside to complete assessment. At this time, MOB was alone sitting comfortably on bed. MOB was very happy and inviting of this Probation officer upon her arrival. This Probation officer explained role and reasoning for visit being due to her hx of anxiety, substance use and baby's new NICU admission.   This Probation officer informed MOB of hospitals policy and procedure regarding substance. This Probation officer informed MOB that baby's UDS and cord blood have been taken. This Probation officer noted to MOB that at this time, baby's cord  blood has not yet come back; however, her UDS was (-). This Probation officer explained that in the event cord blood test is positive a report will be made to [place]. MOB verbalized understanding.   This Probation officer assessed MOB's current mental status and emotions post delivery. At this time MOB noted she is feeling happy thus far and her anxiety is under control. MOB reports she did see CSW Roselyn Reef at the clinic due to being overly anxious one day back in September; however, now is managing well. MOB verbalizes she has no interest in taking medication to treat her dx nor has she ever. This Probation officer discussed PPD and SIDS. MOB verbalized understanding of the pre-cautions for both and is aware of what to do in the event she does experience PPD.   At this time, no other needs were addressed or requested. CSW informed MOB she is available should any needs arise during baby's time spent in the NICU. MOB was receptive to this and thankful. CSW will continue to follow pending cord blood test results.   CSW Plan/Description:  Psychosocial Support and Ongoing Assessment of Needs    Additional useful information: Dominique Mora- DOB 11/11/1977-Husband Dominique Mora-DOB 03/27/2002-Step-son Dominique Mora-DOB 06/21/03-Step-son Dominique Mora-DOB 05/12/04-Step-daughter   MOB notes there was DSS involvement in Feb 2017 with findings against her step-children's biological mother. MOB notes she and her husband now have primary custody.   Dominique Mora, MSW, [place]  [facility]  Office: [phone]

## 2016-01-21 ENCOUNTER — Ambulatory Visit: Payer: Self-pay

## 2016-01-21 NOTE — Lactation Note (Signed)
This note was copied from a baby's chart. Lactation Consultation Note  Patient Name: Dominique Mora [id number]'WToday's Date: 01/21/2016 Reason for consult: Follow-up assessment;NICU baby;Infant < 6lbs [facility] 905 days [facility] Baby's RN in NICU called LC because mom had some questions about increasing supply. Mom was doing kangaroo care at NICU bedside. Mom reports she has been pumping q 2-3 hrs for ~20 mins and is getting ~7820mL. NICU RN has been trying to encourage mom that this is a good amount considering baby's age. Mom reports she has been doing hands on pumping but no hand expression after pumping due to sore nipples; demonstrated hand expression for mom to see that her nipples should not be sore when hand expressing. Encouraged mom to do hand expression for ~5 mins after pumping for 15-20 mins.  Mom asked about different foods &/or drinks to increase her supply; explained that she should aim to eat a normal diet but to include protein at each meal & snack and to drink until she is not thirsty but not to focus on a certain amount of fluid/ d. Reinforced how stimulation is the most important thing for her milk supply & to aim to pump 8-12x in 24hrs and do skin-to-skin when she is able to. Mom reports no further questions. Encour[facility] is able to attempt BF.  Maternal Data    Feeding Feeding Type: Breast Milk with Formula added Nipple Type: Slow - flow Length of feed: 60 min  LATCH Score/Interventions                      Lactation Tools Discussed/Used WIC Program: Yes   Consult Status Consult Status: PRN Follow-up type: In-patient    Oneal GroutLaura C Zarriah Mora 01/21/2016, 4:40 PM

## 2016-01-23 ENCOUNTER — Other Ambulatory Visit: Payer: Self-pay

## 2016-01-25 ENCOUNTER — Ambulatory Visit: Payer: Medicaid Other

## 2016-01-25 DIAGNOSIS — Z5189 Encounter for other specified aftercare: Secondary | ICD-10-CM

## 2016-01-25 NOTE — Progress Notes (Signed)
Patient presented to the office today for a wound check. Patient reports still having her honeycomb applied to her scar. I have removed her honey comb and her steri strips are still in tact. I have advised patient to let the strips fall off by themselves.   Patient voice understanding at this time. She has been schedule for her postpartum appointment.

## 2016-01-26 ENCOUNTER — Other Ambulatory Visit: Payer: Self-pay | Admitting: Family Medicine

## 2016-02-01 ENCOUNTER — Telehealth: Payer: Self-pay | Admitting: *Deleted

## 2016-02-01 MED ORDER — FLUCONAZOLE 100 MG PO TABS: 300.0000 mg | ORAL_TABLET | Freq: Once | ORAL | 0 refills | Status: AC

## 2016-02-01 MED ORDER — FLUCONAZOLE 150 MG PO TABS: 150.0000 mg | ORAL_TABLET | Freq: Once | ORAL | 0 refills | Status: AC

## 2016-02-01 MED ORDER — NYSTATIN-TRIAMCINOLONE 100000-0.1 UNIT/GM-% EX OINT: 1.0000 "application " | TOPICAL_OINTMENT | Freq: Two times a day (BID) | CUTANEOUS | 0 refills | Status: DC

## 2016-02-01 NOTE — Telephone Encounter (Signed)
Came down to the office and informed me that [facility] and also a NICU nurse that it sounds like the pt had yeast on her breast.  Pt reports having severe itching on the breast.  Notified Dr. Jolayne Pantheronstant, provider went to go look at pt's breast and received new orders- Diflucan 300 mg po once, then Diflucan 150 mg three days from 300 mg dose, and also add Mycolog cream.  Pt notified of new orders and pt does not have any other questions.

## 2016-02-01 NOTE — Telephone Encounter (Signed)
Pt left message yesterday stating that she has yeast on her breast from pumping. It is very itchy and rashy and uncomfortable.  She requests treatment and states she cannot wait until after Thanksgiving.

## 2016-02-22 ENCOUNTER — Ambulatory Visit (INDEPENDENT_AMBULATORY_CARE_PROVIDER_SITE_OTHER): Payer: Medicaid Other | Admitting: Obstetrics and Gynecology

## 2016-02-22 MED ORDER — IBUPROFEN 800 MG PO TABS: 800.0000 mg | ORAL_TABLET | Freq: Three times a day (TID) | ORAL | 1 refills | Status: DC | PRN

## 2016-02-22 NOTE — Progress Notes (Signed)
Subjective:   thinks IBS came back, having diarrhea a lot.   Dominique Mora is a 35 y.o. female who presents for a postpartum visit. She is 5 weeks postpartum following a low cervical transverse Cesarean section. I have fully reviewed the prenatal and intrapartum course. The delivery was at 34 gestational weeks. Outcome: cesarean indication: malpresentation: breech. Anesthesia: spinal. Postpartum course has been unremarkable. Baby's course has been unremarkable. Home from NICU. Baby is feeding by bottle - Similac Isomil. Bleeding no bleeding. Bowel function is IBS issues. Had prior to pregnancy however. Bladder function is normal. Patient is sexually active. Contraception method is condoms. Postpartum depression screening: negative.  The following portions of the patient's history were reviewed and updated as appropriate: allergies, current medications, past family history, past medical history, past social history and past surgical history.  Review of Systems Pertinent items are noted in HPI.   Objective:    There were no vitals taken for this visit.  General:  alert   Breasts:  not performed  Lungs: clear to auscultation bilaterally  Heart:  regular rate and rhythm, S1, S2 normal, no murmur, click, rub or gallop  Abdomen: soft, non-tender; bowel sounds normal; no masses,  no organomegaly. Incision healing, small separation noted, no evidence of infection noted   Vulva:  not evaluated  Vagina: not evaluated  Cervix:  not evaluated  Corpus: not examined  Adnexa:  not evaluated  Rectal Exam: Not performed.        Assessment:     Normal postpartum exam.  . DM   Hypothyroidism   IBS  Plan:    1. Contraception: condoms 2. Pt to follow up with PCP to continue care for her chronic medical problems. List provided to pt. Return to normal activities as tolerates 3. Follow up in May 2018 for pap smear and yearly GYN exam or PRN

## 2016-08-30 ENCOUNTER — Encounter: Payer: Self-pay | Admitting: Obstetrics and Gynecology

## 2016-08-30 ENCOUNTER — Ambulatory Visit (INDEPENDENT_AMBULATORY_CARE_PROVIDER_SITE_OTHER): Payer: Medicaid Other | Admitting: Obstetrics and Gynecology

## 2016-08-30 ENCOUNTER — Other Ambulatory Visit (HOSPITAL_COMMUNITY)
Admission: RE | Admit: 2016-08-30 | Discharge: 2016-08-30 | Disposition: A | Discharge status: Home / Self Care | Payer: Medicaid Other | Source: Ambulatory Visit | Attending: Obstetrics and Gynecology | Admitting: Obstetrics and Gynecology

## 2016-08-30 DIAGNOSIS — Z01419 Encounter for gynecological examination (general) (routine) without abnormal findings: Secondary | ICD-10-CM | POA: Diagnosis not present

## 2016-08-30 DIAGNOSIS — E119 Type 2 diabetes mellitus without complications: Secondary | ICD-10-CM | POA: Insufficient documentation

## 2016-08-30 DIAGNOSIS — Z0000 Encounter for general adult medical examination without abnormal findings: Secondary | ICD-10-CM

## 2016-08-30 NOTE — Addendum Note (Signed)
Addended by: Cheree DittoGRAHAM, Brystol Wasilewski A on: 08/30/2016 08:57 AM   Modules accepted: Orders

## 2016-08-30 NOTE — Progress Notes (Signed)
Dominique Mora is a 36 y.o. [phone] female here for a routine annual gynecologic exam. She has no GYN complaints.    Denies abnormal vaginal bleeding, discharge, pelvic pain, problems with intercourse or other gynecologic concerns.    Gynecologic History No LMP recorded. Contraception: none   Obstetric History OB History  Gravida Para Term Preterm AB Living  2 1   1 1 1   SAB TAB Ectopic Multiple Live Births  1     0 1    # Outcome Date GA Lbr Len/2nd Weight Sex Delivery Anes PTL Lv  2 Preterm 01/16/16 [redacted]w[redacted]d  4 lb 12.2 oz (2.16 kg) F CS-LTranv Spinal  LIV     Birth Comments: preterm  1 SAB 10/2002              Past Medical History:  Diagnosis Date  . Diabetes mellitus without complication (HCC)   . Hypothyroidism     Past Surgical History:  Procedure Laterality Date  . CESAREAN SECTION N/A 01/16/2016   Procedure: CESAREAN SECTION;  Surgeon: Hermina Staggers, MD;  Location: [facility] BIRTHING SUITES;  Service: Obstetrics;  Laterality: N/A;  . NO PAST SURGERIES      Current Outpatient Prescriptions on File Prior to Visit  Medication Sig Dispense Refill  . acetaminophen (TYLENOL) 325 MG tablet Take 2 tablets (650 mg total) by mouth every 4 (four) hours as needed (for pain scale < 4). 60 tablet 0  . albuterol (PROVENTIL HFA;VENTOLIN HFA) 108 (90 Base) MCG/ACT inhaler Inhale 2 puffs into the lungs every 6 (six) hours as needed for wheezing or shortness of breath. 1 Inhaler 2  . cetirizine (ZYRTEC) 10 MG tablet Take 1 tablet (10 mg total) by mouth daily. 30 tablet 3  . ibuprofen (ADVIL,MOTRIN) 800 MG tablet Take 1 tablet (800 mg total) by mouth every 8 (eight) hours as needed. (Patient not taking: Reported on 08/30/2016) 60 tablet 1  . levothyroxine (SYNTHROID, LEVOTHROID) 50 MCG tablet Take 1 tablet (50 mcg total) by mouth daily before breakfast. Reported on 07/11/2015 (Patient not taking: Reported on 08/30/2016) 30 tablet 5  . metFORMIN (GLUCOPHAGE) 500 MG tablet Take 2 tablets (1,000 mg total)  by mouth 2 (two) times daily with a meal. (Patient not taking: Reported on 08/30/2016) 120 tablet 2  . Prenatal MV-Min-FA-Omega-3 (PRENATAL GUMMIES/DHA & FA) 0.4-32.5 MG CHEW Chew 2 each by mouth daily.    . ranitidine (ZANTAC) 150 MG tablet Take 1 tablet (150 mg total) by mouth 2 (two) times daily. (Patient not taking: Reported on 08/30/2016) 60 tablet 6   No current facility-administered medications on file prior to visit.     No Known Allergies  Social History   Social History  . Marital status: Single    Spouse name: N/A  . Number of children: N/A  . Years of education: N/A   Occupational History  . Not on file.   Social History Main Topics  . Smoking status: Current Every Day Smoker    Packs/day: 0.25    Types: Cigarettes  . Smokeless tobacco: Never Used  . Alcohol use No  . Drug use: No  . Sexual activity: Yes    Birth control/ protection: None   Other Topics Concern  . Not on file   Social History Narrative  . No narrative on file    No family history on file.  The following portions of the patient's history were reviewed and updated as appropriate: allergies, current medications, past family history, past medical history, past  social history, past surgical history and problem list.  Review of Systems Pertinent items are noted in HPI.   Objective:  BP 126/77   Pulse 78   Wt 208 lb 9.6 oz (94.6 kg)   BMI 34.71 kg/m  CONSTITUTIONAL: Well-developed, well-nourished female in no acute distress.  HENT:  Normocephalic, atraumatic, External right and left ear normal. Oropharynx is clear and moist EYES: Conjunctivae and EOM are normal. Pupils are equal, round, and reactive to light. No scleral icterus.  NECK: Normal range of motion, supple, no masses.  Normal thyroid.  SKIN: Skin is warm and dry. No rash noted. Not diaphoretic. No erythema. No pallor. NEUROLGIC: Alert and oriented to person, place, and time. Normal reflexes, muscle tone coordination. No cranial  nerve deficit noted. PSYCHIATRIC: Normal mood and affect. Normal behavior. Normal judgment and thought content. CARDIOVASCULAR: Normal heart rate noted, regular rhythm RESPIRATORY: Clear to auscultation bilaterally. Effort and breath sounds normal, no problems with respiration noted. BREASTS: Symmetric in size. No masses, skin changes, nipple drainage, or lymphadenopathy. ABDOMEN: Soft, normal bowel sounds, no distention noted.  No tenderness, rebound or guarding.  PELVIC: Normal appearing external genitalia; normal appearing vaginal mucosa and cervix.  No abnormal discharge noted.  Pap smear obtained.  Normal uterine size, no other palpable masses, no uterine or adnexal tenderness. MUSCULOSKELETAL: Normal range of motion. No tenderness.  No cyanosis, clubbing, or edema.  2+ distal pulses.   Assessment:  Annual gynecologic examination with pap smear   Plan:  Will follow up results of pap smear and manage accordingly. Provided list of PCP's Routine preventative health maintenance measures emphasized. Please refer to After Visit Summary for other counseling recommendations.    Hermina StaggersMichael L Marvell Stavola, MD, FACOG Attending Obstetrician & Gynecologist Center for [facility], [facility]

## 2016-08-30 NOTE — Patient Instructions (Signed)

## 2016-09-04 LAB — CYTOLOGY - PAP: DIAGNOSIS: NEGATIVE

## 2017-10-05 ENCOUNTER — Encounter (HOSPITAL_COMMUNITY): Payer: Self-pay | Admitting: Emergency Medicine

## 2017-10-05 ENCOUNTER — Other Ambulatory Visit: Payer: Self-pay

## 2017-10-05 ENCOUNTER — Emergency Department (HOSPITAL_COMMUNITY)
Admission: EM | Admit: 2017-10-05 | Discharge: 2017-10-05 | Disposition: A | Discharge status: Home / Self Care | Payer: Medicaid Other | Attending: Emergency Medicine | Admitting: Emergency Medicine

## 2017-10-05 ENCOUNTER — Emergency Department (HOSPITAL_COMMUNITY): Payer: Medicaid Other

## 2017-10-05 DIAGNOSIS — F17210 Nicotine dependence, cigarettes, uncomplicated: Secondary | ICD-10-CM | POA: Insufficient documentation

## 2017-10-05 DIAGNOSIS — Z79899 Other long term (current) drug therapy: Secondary | ICD-10-CM | POA: Diagnosis not present

## 2017-10-05 DIAGNOSIS — M25562 Pain in left knee: Secondary | ICD-10-CM | POA: Diagnosis not present

## 2017-10-05 DIAGNOSIS — E119 Type 2 diabetes mellitus without complications: Secondary | ICD-10-CM | POA: Insufficient documentation

## 2017-10-05 DIAGNOSIS — E039 Hypothyroidism, unspecified: Secondary | ICD-10-CM | POA: Insufficient documentation

## 2017-10-05 DIAGNOSIS — M25462 Effusion, left knee: Secondary | ICD-10-CM | POA: Diagnosis not present

## 2017-10-05 DIAGNOSIS — J45909 Unspecified asthma, uncomplicated: Secondary | ICD-10-CM | POA: Diagnosis not present

## 2017-10-05 MED ORDER — IBUPROFEN 600 MG PO TABS: 600.0000 mg | ORAL_TABLET | Freq: Four times a day (QID) | ORAL | 0 refills | Status: DC | PRN

## 2017-10-05 MED ORDER — ACETAMINOPHEN 500 MG PO TABS: 500.0000 mg | ORAL_TABLET | Freq: Four times a day (QID) | ORAL | 0 refills | Status: DC | PRN

## 2017-10-05 MED ORDER — HYDROCODONE-ACETAMINOPHEN 5-325 MG PO TABS
1.0000 | ORAL_TABLET | Freq: Once | ORAL | Status: AC
  Administered 2017-10-05: 1 via ORAL
  Filled 2017-10-05: qty 1

## 2017-10-05 NOTE — Progress Notes (Signed)
Orthopedic Tech Progress Note Patient Details:  Dominique Mora 07/18/1980 [id number]  Ortho Devices Type of Ortho Device: Knee Sleeve, Crutches Ortho Device/Splint Location: lle Ortho Device/Splint Interventions: Ordered, Application, Adjustment   Post Interventions Patient Tolerated: Well Instructions Provided: Care of device, Adjustment of device   [name], [name] 10/05/2017, 9:51 PM

## 2017-10-05 NOTE — Discharge Instructions (Signed)
1. Medications: Alternate 600 mg of ibuprofen and 500-1000 mg of Tylenol every 3 hours as needed for pain. Do not exceed 4000 mg of Tylenol daily.  Take ibuprofen with food to avoid upset stomach issues.  °2. Treatment: rest, ice, elevate and use brace, drink plenty of fluids, gentle stretching °3. Follow Up: Please followup with orthopedics as directed or your PCP in 1 week if no improvement for discussion of your diagnoses and further evaluation after today's visit; if you do not have a primary care doctor use the resource guide provided to find one; Please return to the ER for worsening symptoms or other concerns such as worsening swelling, redness of the skin, fevers, loss of pulses, or loss of feeling ° °

## 2017-10-05 NOTE — ED Provider Notes (Signed)
[facility] Provider Note   CSN: [id number] Arrival date & time: 10/05/17  1913     History   Chief Complaint Chief Complaint  Patient presents with  . Knee Injury    HPI Dominique Mora is a 37 y.o. female presents today for evaluation of acute onset, constant  left knee pain secondary to injury earlier today.  Patient states that she was at a children's birthday partyin a bouncy house when someone bounced her up in the air, causing her to evert her left ankle and land on her flexed left knee.  She denies head injury or loss of consciousness.  She states that she did hear a "crack" initially and immediate onset of pain primarily to the posterior aspect of the left knee.  Pain radiates up to the hip and down to the ankle but she denies any pain at the site specifically.  She does note intermittent numbness and tingling of her toes.  She has constant dull throbbing pain at rest but this will intensify with any flexion or extension of the knee.  She has been able to bear weight on the extremity with significant pain however.  Denies back pain.  No medications prior to arrival. The history is provided by the patient.    Past Medical History:  Diagnosis Date  . Diabetes mellitus without complication (HCC)   . Hypothyroidism     Patient Active Problem List   Diagnosis Date Noted  . DM (diabetes mellitus) (HCC) 08/30/2016  . Encntr for gyn exam (general) (routine) w/o abn findings 08/30/2016  . Tobacco abuse 01/15/2016  . GAD (generalized anxiety disorder) 11/22/2015  . Asthma 07/12/2015  . PCO (polycystic ovaries) 07/12/2015  . Hypothyroid 07/12/2015  . Poor dentition 07/12/2015    Past Surgical History:  Procedure Laterality Date  . CESAREAN SECTION N/A 01/16/2016   Procedure: CESAREAN SECTION;  Surgeon: Hermina StaggersMichael L Ervin, MD;  [facility];  Service: Obstetrics;  Laterality: N/A;  . NO PAST SURGERIES       OB History    Gravida  2   Para  1   Term      Preterm  1   AB  1   Living  1     SAB  1   TAB      Ectopic      Multiple  0   Live Births  1            Home Medications    Prior to Admission medications   Medication Sig Start Date End Date Taking? Authorizing Provider  acetaminophen (TYLENOL) 325 MG tablet Take 2 tablets (650 mg total) by mouth every 4 (four) hours as needed (for pain scale < 4). 01/19/16   Youlanda MightySantos, Josue D, MD  albuterol (PROVENTIL HFA;VENTOLIN HFA) 108 (90 Base) MCG/ACT inhaler Inhale 2 puffs into the lungs every 6 (six) hours as needed for wheezing or shortness of breath. 12/29/15   Laramie BingPickens, Charlie, MD  cetirizine (ZYRTEC) 10 MG tablet Take 1 tablet (10 mg total) by mouth daily. 12/06/15   Anyanwu, Jethro BastosUgonna A, MD  ibuprofen (ADVIL,MOTRIN) 800 MG tablet Take 1 tablet (800 mg total) by mouth every 8 (eight) hours as needed. Patient not taking: Reported on 08/30/2016 02/22/16   Hermina StaggersErvin, Michael L, MD  levothyroxine (SYNTHROID, LEVOTHROID) 50 MCG tablet Take 1 tablet (50 mcg total) by mouth daily before breakfast. Reported on 07/11/2015 Patient not taking: Reported on 08/30/2016 07/12/15   Elsie LincolnLeggett, Kelly  H, MD  metFORMIN (GLUCOPHAGE) 500 MG tablet Take 2 tablets (1,000 mg total) by mouth 2 (two) times daily with a meal. Patient not taking: Reported on 08/30/2016 01/19/16   Leftwich-Kirby, Wilmer Floor, CNM  Prenatal MV-Min-FA-Omega-3 (PRENATAL GUMMIES/DHA & FA) 0.4-32.5 MG CHEW Chew 2 each by mouth daily.    [provider]  ranitidine (ZANTAC) 150 MG tablet Take 1 tablet (150 mg total) by mouth 2 (two) times daily. Patient not taking: Reported on 08/30/2016 07/11/15   Lesly Dukes, MD    Family History No family history on file.  Social History Social History   Tobacco Use  . Smoking status: Current Every Day Smoker    Packs/day: 0.25    Types: Cigarettes  . Smokeless tobacco: Never Used  Substance Use Topics  . Alcohol use: No  . Drug use: No     Allergies     Clindamycin/lincomycin   Review of Systems Review of Systems  Musculoskeletal: Positive for arthralgias and gait problem. Negative for back pain and joint swelling.  Neurological: Positive for numbness. Negative for syncope and weakness.     Physical Exam Updated Vital Signs BP (!) 136/92 (BP Location: Left Arm)   Pulse 85   Temp 98.7 F (37.1 C) (Oral)   Resp 18   LMP 08/31/2017   SpO2 97%   Physical Exam  Constitutional: She appears well-developed and well-nourished. No distress.  HENT:  Head: Normocephalic and atraumatic.  Eyes: Conjunctivae are normal. Right eye exhibits no discharge. Left eye exhibits no discharge.  Neck: No JVD present. No tracheal deviation present.  Cardiovascular: Normal rate and intact distal pulses.  2+ radial and DP/PT pulses bilaterally, no lower extremity edema, compartments are soft.  No palpable cords, Homans sign absent bilaterally  Pulmonary/Chest: Effort normal.  Abdominal: She exhibits no distension.  Musculoskeletal: She exhibits tenderness. She exhibits no edema.       Right knee: Normal.       Left knee: She exhibits decreased range of motion. She exhibits no ecchymosis, no deformity, no laceration, normal alignment, no LCL laxity, normal patellar mobility, normal meniscus and no MCL laxity. Tenderness found. Lateral joint line tenderness noted.       Left ankle: Normal. Achilles tendon normal.       Lumbar back: Normal.  Able to extend the left knee against gravity without difficulty.  5/5 strength of BLE major muscle groups.  Pain primarily to the posterior aspect of the left knee and on palpation of the hamstrings.  No swelling, ecchymosis, or deformity noted.  Neurological: She is alert. No sensory deficit. She exhibits normal muscle tone.  Fluent speech with no evidence of dysarthria or aphasia, no facial droop, sensation intact to soft touch of bilateral lower extremities.  Patient ambulates with an antalgic gait, this improves with  toe walking but worsens with heel walking.  Skin: Skin is warm and dry. No erythema.  Psychiatric: She has a normal mood and affect. Her behavior is normal.  Nursing note and vitals reviewed.    ED Treatments / Results  Labs (all labs ordered are listed, but only abnormal results are displayed) Labs Reviewed - No data to display  EKG None  Radiology Dg Knee Complete 4 Views Left  Result Date: 10/05/2017 CLINICAL DATA:  Slipped, twisting left knee.  Pain. EXAM: LEFT KNEE - COMPLETE 4+ VIEW COMPARISON:  None. FINDINGS: Small joint effusion. No acute bony abnormality. Specifically, no fracture, subluxation, or dislocation. Joint spaces maintained. IMPRESSION: Small joint  effusion.  No acute bony abnormality. Electronically Signed   By: Charlett Nose M.D.   On: 10/05/2017 19:53    Procedures Procedures (including critical care time)  Medications Ordered in ED Medications  HYDROcodone-acetaminophen (NORCO/VICODIN) 5-325 MG per tablet 1 tablet (has no administration in time range)     Initial Impression / Assessment and Plan / ED Course  I have reviewed the triage vital signs and the nursing notes.  Pertinent labs & imaging results that were available during my care of the patient were reviewed by me and considered in my medical decision making (see chart for details).     Patient with primarily posterior left knee pain secondary to injury earlier today.  She is afebrile, vital signs are stable.  She is neurovascularly intact.  She is able to bear weight on the extremity despite significant pain.  Patient X-Ray negative for obvious fracture or dislocation.  There is joint effusion on x-ray.  Examination somewhat consistent with possible ruptured Baker's cyst versus tendinopathy.  Pain managed in ED. Pt advised to follow up with orthopedics if symptoms persist for possibility of missed fracture diagnosis. Patient given brace while in ED, conservative therapy recommended and discussed.   Discussed strict ED return precautions. Pt verbalized understanding of and agreement with plan and is safe for discharge home at this time.  Final Clinical Impressions(s) / ED Diagnoses   Final diagnoses:  Acute pain of left knee    ED Discharge Orders    None       Bennye Alm 10/05/17 2131    Cathren Laine, MD 10/06/17 [phone]

## 2017-10-05 NOTE — ED Notes (Signed)
Patient transported to X-ray 

## 2017-10-05 NOTE — ED Triage Notes (Signed)
Pt was at a childs birthday party with her toddler today and went in the bouncy house.  Left knee twisted and is swollen. Painful to bare weight at this time.

## 2017-10-16 DIAGNOSIS — M533 Sacrococcygeal disorders, not elsewhere classified: Secondary | ICD-10-CM | POA: Diagnosis not present

## 2017-10-16 DIAGNOSIS — M25462 Effusion, left knee: Secondary | ICD-10-CM | POA: Diagnosis not present

## 2017-11-20 DIAGNOSIS — M25462 Effusion, left knee: Secondary | ICD-10-CM | POA: Diagnosis not present

## 2017-11-22 ENCOUNTER — Other Ambulatory Visit: Payer: Self-pay | Admitting: Orthopedic Surgery

## 2017-11-22 DIAGNOSIS — S8992XA Unspecified injury of left lower leg, initial encounter: Secondary | ICD-10-CM

## 2017-11-28 ENCOUNTER — Ambulatory Visit
Admission: RE | Admit: 2017-11-28 | Discharge: 2017-11-28 | Disposition: A | Discharge status: Home / Self Care | Payer: Medicaid Other | Source: Ambulatory Visit | Attending: Orthopedic Surgery | Admitting: Orthopedic Surgery

## 2017-11-28 DIAGNOSIS — S8992XA Unspecified injury of left lower leg, initial encounter: Secondary | ICD-10-CM

## 2017-11-28 DIAGNOSIS — M25562 Pain in left knee: Secondary | ICD-10-CM | POA: Diagnosis not present

## 2017-12-02 ENCOUNTER — Ambulatory Visit (INDEPENDENT_AMBULATORY_CARE_PROVIDER_SITE_OTHER): Payer: Medicaid Other | Admitting: *Deleted

## 2017-12-02 ENCOUNTER — Encounter: Payer: Self-pay | Admitting: Family Medicine

## 2017-12-02 DIAGNOSIS — Z3201 Encounter for pregnancy test, result positive: Secondary | ICD-10-CM

## 2017-12-02 LAB — POCT PREGNANCY, URINE: PREG TEST UR: POSITIVE — AB

## 2017-12-02 NOTE — Progress Notes (Signed)
Chart reviewed - agree with RN documentation.   

## 2017-12-02 NOTE — Progress Notes (Signed)
Pt here for pregnancy test which resulted positive. Pt not quite sure but thinks LMP approximately 10/14/17. EDD Jul 21, 2018. [place] 769w0d. Proof of pregnancy letter provided. Safe medication list given.  Pt reports hx of GDM and reports she will begin checking her blood sugars at home  Pt reports she has already started prenatal vitamins.  Medications reviewed. Information on [facility] given per pt request for PCP.  Discussed with Dr. Adrian BlackwaterStinson and d/t pt's history and unsure LMP, viability U/S ordered and scheduled Thursday 9/26 @ 0930.

## 2017-12-05 ENCOUNTER — Ambulatory Visit (INDEPENDENT_AMBULATORY_CARE_PROVIDER_SITE_OTHER): Payer: Medicaid Other | Admitting: General Practice

## 2017-12-05 ENCOUNTER — Ambulatory Visit (HOSPITAL_COMMUNITY)
Admission: RE | Admit: 2017-12-05 | Discharge: 2017-12-05 | Disposition: A | Discharge status: Home / Self Care | Payer: Medicaid Other | Source: Ambulatory Visit | Attending: Family Medicine | Admitting: Family Medicine

## 2017-12-05 DIAGNOSIS — Z3A01 Less than 8 weeks gestation of pregnancy: Secondary | ICD-10-CM | POA: Diagnosis not present

## 2017-12-05 DIAGNOSIS — Z712 Person consulting for explanation of examination or test findings: Secondary | ICD-10-CM

## 2017-12-05 DIAGNOSIS — Z3201 Encounter for pregnancy test, result positive: Secondary | ICD-10-CM | POA: Insufficient documentation

## 2017-12-05 DIAGNOSIS — O3680X0 Pregnancy with inconclusive fetal viability, not applicable or unspecified: Secondary | ICD-10-CM | POA: Diagnosis not present

## 2017-12-05 NOTE — Progress Notes (Signed)
Patient presents to office today for viability ultrasound results. Per Dr Debroah Loop, ultrasound shows living IUP- patient should begin prenatal care.  Informed patient of results, reviewed dating, & provided pictures. Patient verbalized understanding to all & will follow up at scheduled appt on 10/21 to start OB care.

## 2017-12-30 ENCOUNTER — Ambulatory Visit (INDEPENDENT_AMBULATORY_CARE_PROVIDER_SITE_OTHER): Payer: Medicaid Other | Admitting: Obstetrics & Gynecology

## 2017-12-30 ENCOUNTER — Encounter: Payer: Self-pay | Admitting: Obstetrics & Gynecology

## 2017-12-30 VITALS — BP 114/75 | HR 77 | Wt 203.2 lb

## 2017-12-30 DIAGNOSIS — O09891 Supervision of other high risk pregnancies, first trimester: Secondary | ICD-10-CM | POA: Diagnosis not present

## 2017-12-30 DIAGNOSIS — Z98891 History of uterine scar from previous surgery: Secondary | ICD-10-CM | POA: Insufficient documentation

## 2017-12-30 DIAGNOSIS — O0990 Supervision of high risk pregnancy, unspecified, unspecified trimester: Secondary | ICD-10-CM | POA: Diagnosis not present

## 2017-12-30 DIAGNOSIS — E039 Hypothyroidism, unspecified: Secondary | ICD-10-CM

## 2017-12-30 DIAGNOSIS — E119 Type 2 diabetes mellitus without complications: Secondary | ICD-10-CM

## 2017-12-30 DIAGNOSIS — E031 Congenital hypothyroidism without goiter: Secondary | ICD-10-CM

## 2017-12-30 DIAGNOSIS — Z8751 Personal history of pre-term labor: Secondary | ICD-10-CM

## 2017-12-30 HISTORY — DX: Personal history of pre-term labor: Z87.51

## 2017-12-30 HISTORY — DX: History of uterine scar from previous surgery: Z98.891

## 2017-12-30 MED ORDER — LEVOTHYROXINE SODIUM 50 MCG PO TABS: 50.0000 ug | ORAL_TABLET | Freq: Every day | ORAL | 1 refills | Status: DC

## 2017-12-30 MED ORDER — METFORMIN HCL 500 MG PO TABS: 500.0000 mg | ORAL_TABLET | Freq: Two times a day (BID) | ORAL | 2 refills | Status: DC

## 2017-12-30 MED ORDER — GLUCOSE BLOOD VI STRP: ORAL_STRIP | 12 refills | Status: DC

## 2017-12-30 MED ORDER — ACCU-CHEK FASTCLIX LANCETS MISC: 12 refills | Status: DC

## 2017-12-30 MED ORDER — ASPIRIN EC 81 MG PO TBEC: 81.0000 mg | DELAYED_RELEASE_TABLET | Freq: Every day | ORAL | 2 refills | Status: DC

## 2017-12-30 NOTE — Patient Instructions (Signed)
First Trimester of Pregnancy The first trimester of pregnancy is from week 1 until the end of week 13 (months 1 through 3). A week after a sperm fertilizes an egg, the egg will implant on the wall of the uterus. This embryo will begin to develop into a baby. Genes from you and your partner will form the baby. The female genes will determine whether the baby will be a boy or a girl. At 6-8 weeks, the eyes and face will be formed, and the heartbeat can be seen on ultrasound. At the end of 12 weeks, all the baby's organs will be formed. Now that you are pregnant, you will want to do everything you can to have a healthy baby. Two of the most important things are to get good prenatal care and to follow your health care provider's instructions. Prenatal care is all the medical care you receive before the baby's birth. This care will help prevent, find, and treat any problems during the pregnancy and childbirth. Body changes during your first trimester Your body goes through many changes during pregnancy. The changes vary from woman to woman.  You may gain or lose a couple of pounds at first.  You may feel sick to your stomach (nauseous) and you may throw up (vomit). If the vomiting is uncontrollable, call your health care provider.  You may tire easily.  You may develop headaches that can be relieved by medicines. All medicines should be approved by your health care provider.  You may urinate more often. Painful urination may mean you have a bladder infection.  You may develop heartburn as a result of your pregnancy.  You may develop constipation because certain hormones are causing the muscles that push stool through your intestines to slow down.  You may develop hemorrhoids or swollen veins (varicose veins).  Your breasts may begin to grow larger and become tender. Your nipples may stick out more, and the tissue that surrounds them (areola) may become darker.  Your gums may bleed and may be  sensitive to brushing and flossing.  Dark spots or blotches (chloasma, mask of pregnancy) may develop on your face. This will likely fade after the baby is born.  Your menstrual periods will stop.  You may have a loss of appetite.  You may develop cravings for certain kinds of food.  You may have changes in your emotions from day to day, such as being excited to be pregnant or being concerned that something may go wrong with the pregnancy and baby.  You may have more vivid and strange dreams.  You may have changes in your hair. These can include thickening of your hair, rapid growth, and changes in texture. Some women also have hair loss during or after pregnancy, or hair that feels dry or thin. Your hair will most likely return to normal after your baby is born.  What to expect at prenatal visits During a routine prenatal visit:  You will be weighed to make sure you and the baby are growing normally.  Your blood pressure will be taken.  Your abdomen will be measured to track your baby's growth.  The fetal heartbeat will be listened to between weeks 10 and 14 of your pregnancy.  Test results from any previous visits will be discussed.  Your health care provider may ask you:  How you are feeling.  If you are feeling the baby move.  If you have had any abnormal symptoms, such as leaking fluid, bleeding, severe headaches,   or abdominal cramping.  If you are using any tobacco products, including cigarettes, chewing tobacco, and electronic cigarettes.  If you have any questions.  Other tests that may be performed during your first trimester include:  Blood tests to find your blood type and to check for the presence of any previous infections. The tests will also be used to check for low iron levels (anemia) and protein on red blood cells (Rh antibodies). Depending on your risk factors, or if you previously had diabetes during pregnancy, you may have tests to check for high blood  sugar that affects pregnant women (gestational diabetes).  Urine tests to check for infections, diabetes, or protein in the urine.  An ultrasound to confirm the proper growth and development of the baby.  Fetal screens for spinal cord problems (spina bifida) and Down syndrome.  HIV (human immunodeficiency virus) testing. Routine prenatal testing includes screening for HIV, unless you choose not to have this test.  You may need other tests to make sure you and the baby are doing well.  Follow these instructions at home: Medicines  Follow your health care provider's instructions regarding medicine use. Specific medicines may be either safe or unsafe to take during pregnancy.  Take a prenatal vitamin that contains at least 600 micrograms (mcg) of folic acid.  If you develop constipation, try taking a stool softener if your health care provider approves. Eating and drinking  Eat a balanced diet that includes fresh fruits and vegetables, whole grains, good sources of protein such as meat, eggs, or tofu, and low-fat dairy. Your health care provider will help you determine the amount of weight gain that is right for you.  Avoid raw meat and uncooked cheese. These carry germs that can cause birth defects in the baby.  Eating four or five small meals rather than three large meals a day may help relieve nausea and vomiting. If you start to feel nauseous, eating a few soda crackers can be helpful. Drinking liquids between meals, instead of during meals, also seems to help ease nausea and vomiting.  Limit foods that are high in fat and processed sugars, such as fried and sweet foods.  To prevent constipation: ? Eat foods that are high in fiber, such as fresh fruits and vegetables, whole grains, and beans. ? Drink enough fluid to keep your urine clear or pale yellow. Activity  Exercise only as directed by your health care provider. Most women can continue their usual exercise routine during  pregnancy. Try to exercise for 30 minutes at least 5 days a week. Exercising will help you: ? Control your weight. ? Stay in shape. ? Be prepared for labor and delivery.  Experiencing pain or cramping in the lower abdomen or lower back is a good sign that you should stop exercising. Check with your health care provider before continuing with normal exercises.  Try to avoid standing for long periods of time. Move your legs often if you must stand in one place for a long time.  Avoid heavy lifting.  Wear low-heeled shoes and practice good posture.  You may continue to have sex unless your health care provider tells you not to. Relieving pain and discomfort  Wear a good support bra to relieve breast tenderness.  Take warm sitz baths to soothe any pain or discomfort caused by hemorrhoids. Use hemorrhoid cream if your health care provider approves.  Rest with your legs elevated if you have leg cramps or low back pain.  If you develop   varicose veins in your legs, wear support hose. Elevate your feet for 15 minutes, 3-4 times a day. Limit salt in your diet. Prenatal care  Schedule your prenatal visits by the twelfth week of pregnancy. They are usually scheduled monthly at first, then more often in the last 2 months before delivery.  Write down your questions. Take them to your prenatal visits.  Keep all your prenatal visits as told by your health care provider. This is important. Safety  Wear your seat belt at all times when driving.  Make a list of emergency phone numbers, including numbers for family, friends, the hospital, and police and fire departments. General instructions  Ask your health care provider for a referral to a local prenatal education class. Begin classes no later than the beginning of month 6 of your pregnancy.  Ask for help if you have counseling or nutritional needs during pregnancy. Your health care provider can offer advice or refer you to specialists for help  with various needs.  Do not use hot tubs, steam rooms, or saunas.  Do not douche or use tampons or scented sanitary pads.  Do not cross your legs for long periods of time.  Avoid cat litter boxes and soil used by cats. These carry germs that can cause birth defects in the baby and possibly loss of the fetus by miscarriage or stillbirth.  Avoid all smoking, herbs, alcohol, and medicines not prescribed by your health care provider. Chemicals in these products affect the formation and growth of the baby.  Do not use any products that contain nicotine or tobacco, such as cigarettes and e-cigarettes. If you need help quitting, ask your health care provider. You may receive counseling support and other resources to help you quit.  Schedule a dentist appointment. At home, brush your teeth with a soft toothbrush and be gentle when you floss. Contact a health care provider if:  You have dizziness.  You have mild pelvic cramps, pelvic pressure, or nagging pain in the abdominal area.  You have persistent nausea, vomiting, or diarrhea.  You have a bad smelling vaginal discharge.  You have pain when you urinate.  You notice increased swelling in your face, hands, legs, or ankles.  You are exposed to fifth disease or chickenpox.  You are exposed to German measles (rubella) and have never had it. Get help right away if:  You have a fever.  You are leaking fluid from your vagina.  You have spotting or bleeding from your vagina.  You have severe abdominal cramping or pain.  You have rapid weight gain or loss.  You vomit blood or material that looks like coffee grounds.  You develop a severe headache.  You have shortness of breath.  You have any kind of trauma, such as from a fall or a car accident. Summary  The first trimester of pregnancy is from week 1 until the end of week 13 (months 1 through 3).  Your body goes through many changes during pregnancy. The changes vary from  woman to woman.  You will have routine prenatal visits. During those visits, your health care provider will examine you, discuss any test results you may have, and talk with you about how you are feeling. This information is not intended to replace advice given to you by your health care provider. Make sure you discuss any questions you have with your health care provider. Document Released: 02/20/2001 Document Revised: 02/08/2016 Document Reviewed: 02/08/2016 Elsevier Interactive Patient Education  2018 Elsevier   Inc.  

## 2017-12-30 NOTE — Progress Notes (Signed)
  Subjective:    Dominique Mora is a [id number] [redacted]w[redacted]d being seen today for her first obstetrical visit.  Her obstetrical history is significant for advanced maternal age and diabetes in pregnancy, preterm birth. Patient does intend to breast feed. Pregnancy history fully reviewed.  Patient reports She has not been on medications for diabetes nor her synthroid.  Vitals:   12/30/17 1356  BP: 114/75  Pulse: 77  Weight: 203 lb 3.2 oz (92.2 kg)    HISTORY: OB History  Gravida Para Term Preterm AB Living  3 1   1 1 1   SAB TAB Ectopic Multiple Live Births  1     0 1    # Outcome Date GA Lbr Len/2nd Weight Sex Delivery Anes PTL Lv  3 Current           2 Preterm 01/16/16 [redacted]w[redacted]d  4 lb 12.2 oz (2.16 kg) F CS-LTranv Spinal  LIV     Birth Comments: preterm  1 SAB 10/2002           Past Medical History:  Diagnosis Date  . Asthma   . Diabetes mellitus without complication (HCC)   . Hypothyroidism    Past Surgical History:  Procedure Laterality Date  . CESAREAN SECTION N/A 01/16/2016   Procedure: CESAREAN SECTION;  Surgeon: Hermina Staggers, MD;  Location: [facility] BIRTHING SUITES;  Service: Obstetrics;  Laterality: N/A;  . NO PAST SURGERIES     History reviewed. No pertinent family history.   Exam    Uterus:     Pelvic Exam:    Perineum: No Hemorrhoids   Vulva: normal   Vagina:  normal mucosa   pH:     Cervix: no lesions   Adnexa: normal adnexa   Bony Pelvis: average  System: Breast:  normal appearance, no masses or tenderness   Skin: normal coloration and turgor, no rashes    Neurologic: oriented, normal mood   Extremities: normal strength, tone, and muscle mass   HEENT PERRLA, extra ocular movement intact, sclera clear, anicteric and thyroid without masses   Mouth/Teeth mucous membranes moist, pharynx normal without lesions and dental hygiene poor   Neck supple   Cardiovascular: regular rate and rhythm   Respiratory:  appears well, vitals normal, no respiratory distress,  acyanotic, normal RR, ear and throat exam is normal   Abdomen: soft, non-tender; bowel sounds normal; no masses,  no organomegaly   Urinary: urethral meatus normal      Assessment:    Pregnancy: [id number] Patient Active Problem List   Diagnosis Date Noted  . Supervision of high risk pregnancy, antepartum 12/30/2017  . DM (diabetes mellitus) (HCC) 08/30/2016  . Encntr for gyn exam (general) (routine) w/o abn findings 08/30/2016  . Tobacco abuse 01/15/2016  . GAD (generalized anxiety disorder) 11/22/2015  . Asthma 07/12/2015  . PCO (polycystic ovaries) 07/12/2015  . Hypothyroid 07/12/2015  . Poor dentition 07/12/2015        Plan:     Initial labs drawn. Prenatal vitamins. Problem list reviewed and updated. Genetic Screening discussed First Screen: not done, Panorama ordered.  Ultrasound discussed; fetal survey: ordered.  Follow up in 2 weeks. 50% of 30 min visit spent on counseling and coordination of care.  Synthroid and metformin ordered. ASA at 12 weeks. She plans to start 17 P at 16 weeks   Scheryl Darter 12/30/2017

## 2018-01-01 LAB — CULTURE, OB URINE

## 2018-01-01 LAB — URINE CULTURE, OB REFLEX

## 2018-01-10 LAB — OBSTETRIC PANEL, INCLUDING HIV
ANTIBODY SCREEN: NEGATIVE
BASOS: 1 %
Basophils Absolute: 0.1 10*3/uL (ref 0.0–0.2)
EOS (ABSOLUTE): 0.3 10*3/uL (ref 0.0–0.4)
Eos: 3 %
HEMATOCRIT: 36.4 % (ref 34.0–46.6)
HEMOGLOBIN: 12.6 g/dL (ref 11.1–15.9)
HIV Screen 4th Generation wRfx: NONREACTIVE
Hepatitis B Surface Ag: NEGATIVE
IMMATURE GRANS (ABS): 0 10*3/uL (ref 0.0–0.1)
IMMATURE GRANULOCYTES: 0 %
LYMPHS: 32 %
Lymphocytes Absolute: 3.1 10*3/uL (ref 0.7–3.1)
MCH: 30.7 pg (ref 26.6–33.0)
MCHC: 34.6 g/dL (ref 31.5–35.7)
MCV: 89 fL (ref 79–97)
MONOCYTES: 4 %
Monocytes Absolute: 0.4 10*3/uL (ref 0.1–0.9)
Neutrophils Absolute: 5.9 10*3/uL (ref 1.4–7.0)
Neutrophils: 60 %
Platelets: 375 10*3/uL (ref 150–450)
RBC: 4.1 x10E6/uL (ref 3.77–5.28)
RDW: 12.4 % (ref 12.3–15.4)
RPR Ser Ql: NONREACTIVE
Rh Factor: POSITIVE
Rubella Antibodies, IGG: 6.51 index (ref >0.99)
WBC: 9.7 10*3/uL (ref 3.4–10.8)

## 2018-01-10 LAB — HEMOGLOBINOPATHY EVALUATION
Ferritin: 215 ng/mL — ABNORMAL HIGH (ref 15–150)
HGB A: 97.8 % (ref 96.4–98.8)
HGB F QUANT: 0 % (ref 0.0–2.0)
HGB S: 0 %
HGB SOLUBILITY: NEGATIVE
HGB VARIANT: 0 %
Hgb A2 Quant: 2.2 % (ref 1.8–3.2)
Hgb C: 0 %

## 2018-01-10 LAB — SMN1 COPY NUMBER ANALYSIS (SMA CARRIER SCREENING)

## 2018-01-10 LAB — COMPREHENSIVE METABOLIC PANEL
ALT: 20 IU/L (ref 0–32)
AST: 16 IU/L (ref 0–40)
Albumin/Globulin Ratio: 1.5 (ref 1.2–2.2)
Albumin: 4.1 g/dL (ref 3.5–5.5)
Alkaline Phosphatase: 42 IU/L (ref 39–117)
BUN / CREAT RATIO: 12 (ref 9–23)
BUN: 7 mg/dL (ref 6–20)
Bilirubin Total: 0.3 mg/dL (ref 0.0–1.2)
CO2: 19 mmol/L — AB (ref 20–29)
CREATININE: 0.6 mg/dL (ref 0.57–1.00)
Calcium: 9.1 mg/dL (ref 8.7–10.2)
Chloride: 102 mmol/L (ref 96–106)
GFR calc Af Amer: 134 mL/min/{1.73_m2} (ref >59)
GFR, EST NON AFRICAN AMERICAN: 116 mL/min/{1.73_m2} (ref >59)
GLOBULIN, TOTAL: 2.7 g/dL (ref 1.5–4.5)
GLUCOSE: 124 mg/dL — AB (ref 65–99)
Potassium: 4.2 mmol/L (ref 3.5–5.2)
SODIUM: 136 mmol/L (ref 134–144)
TOTAL PROTEIN: 6.8 g/dL (ref 6.0–8.5)

## 2018-01-10 LAB — CYSTIC FIBROSIS GENE TEST

## 2018-01-10 LAB — HEMOGLOBIN A1C
Est. average glucose Bld gHb Est-mCnc: 174 mg/dL
HEMOGLOBIN A1C: 7.7 % — AB (ref 4.8–5.6)

## 2018-01-10 LAB — TSH: TSH: 2.13 u[IU]/mL (ref 0.450–4.500)

## 2018-01-10 LAB — PROTEIN / CREATININE RATIO, URINE
CREATININE, UR: 129.2 mg/dL
PROTEIN UR: 9.4 mg/dL
PROTEIN/CREAT RATIO: 73 mg/g{creat} (ref 0–200)

## 2018-01-14 ENCOUNTER — Ambulatory Visit: Payer: Medicaid Other | Admitting: *Deleted

## 2018-01-14 ENCOUNTER — Encounter: Payer: Medicaid Other | Attending: Obstetrics and Gynecology | Admitting: *Deleted

## 2018-01-14 ENCOUNTER — Encounter (HOSPITAL_COMMUNITY): Payer: Self-pay

## 2018-01-14 DIAGNOSIS — E1165 Type 2 diabetes mellitus with hyperglycemia: Secondary | ICD-10-CM | POA: Diagnosis not present

## 2018-01-14 DIAGNOSIS — Z3A00 Weeks of gestation of pregnancy not specified: Secondary | ICD-10-CM | POA: Diagnosis not present

## 2018-01-14 DIAGNOSIS — E119 Type 2 diabetes mellitus without complications: Secondary | ICD-10-CM

## 2018-01-14 DIAGNOSIS — O24111 Pre-existing diabetes mellitus, type 2, in pregnancy, first trimester: Secondary | ICD-10-CM | POA: Diagnosis not present

## 2018-01-14 MED ORDER — ACCU-CHEK GUIDE W/DEVICE KIT: 1.0000 | PACK | Freq: Once | 0 refills | Status: AC

## 2018-01-14 MED ORDER — INSULIN NPH (HUMAN) (ISOPHANE) 100 UNIT/ML ~~LOC~~ SUSP: 28.0000 [IU] | Freq: Every day | SUBCUTANEOUS | 3 refills | Status: DC

## 2018-01-14 MED ORDER — INSULIN LISPRO 100 UNIT/ML ~~LOC~~ SOLN: 10.0000 [IU] | Freq: Every day | SUBCUTANEOUS | 11 refills | Status: DC

## 2018-01-14 MED ORDER — GLUCOSE BLOOD VI STRP: ORAL_STRIP | 12 refills | Status: DC

## 2018-01-14 MED ORDER — INSULIN LISPRO 100 UNIT/ML ~~LOC~~ SOLN: 14.0000 [IU] | Freq: Every day | SUBCUTANEOUS | 11 refills | Status: DC

## 2018-01-14 MED ORDER — "INSULIN SYRINGE-NEEDLE U-100 30G X 1/2"" 1 ML MISC": 3.0000 | Freq: Two times a day (BID) | 2 refills | Status: DC

## 2018-01-14 MED ORDER — ACCU-CHEK FASTCLIX LANCETS MISC: 1.0000 | Freq: Four times a day (QID) | 12 refills | Status: DC

## 2018-01-14 MED ORDER — INSULIN NPH (HUMAN) (ISOPHANE) 100 UNIT/ML ~~LOC~~ SUSP: 10.0000 [IU] | Freq: Every day | SUBCUTANEOUS | 3 refills | Status: DC

## 2018-01-15 ENCOUNTER — Encounter: Payer: Self-pay | Admitting: *Deleted

## 2018-01-17 DIAGNOSIS — O24113 Pre-existing diabetes mellitus, type 2, in pregnancy, third trimester: Secondary | ICD-10-CM

## 2018-01-17 DIAGNOSIS — E1165 Type 2 diabetes mellitus with hyperglycemia: Secondary | ICD-10-CM | POA: Insufficient documentation

## 2018-01-17 NOTE — Progress Notes (Signed)
Patient was seen on 01/14/2018 for type 2 Diabetes and pregnancy self-management. EDD 07/23/2018. Patient states history of this diabetes for several years. 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  Diet history obtained. Patient eats good variety of all food groups and typically low carb and high protein snacks. Beverages include water, diet green tea and milk mixed 1/2 and 1/2 with almond milk to help reduce the carbs.  Her comfort level with carb counting is 6/10. Patient is currently on Metformin diabetes medications.  The following learning objectives were met by the patient :   States the definition of type 2 Diabetes and pregnancy   States why dietary management is important in controlling blood glucose  Describes the effects of carbohydrates on blood glucose levels  Demonstrates ability to create a balanced meal plan  Demonstrates carbohydrate counting   States when to check blood glucose levels  Demonstrates proper blood glucose monitoring techniques  States the effect of stress and exercise on blood glucose levels  States the importance of limiting caffeine and abstaining from alcohol and smoking  Plan:   Aim for 3 Carb Choices per meal (45 grams) +/- 1 either way   Aim for 1-2 Carbs per snack  Begin reading food labels for Total Carbohydrate of foods  Consider  increasing your activity level by walking or other activity daily as tolerated  Begin checking BG before breakfast and 2 hours after first bite of breakfast, lunch and dinner as directed by MD   Bring Log Book/Sheet to every medical appointment    Patient was introduced to [employer], she states she prefers to record BG on Log Sheet  Take medication as directed by MD  Patient already has a meter:  And is testing pre breakfast and 2 hours each meal as directed by MD Review of Log Book shows: majority of FBG and post meal BG's significantly above target ranges.   I spoke with Dr.  Mora Bellman regarding this patient's BG numbers and we agree she needs to start on insulin using weight calculations. Patient not pleased with the idea of injections but agreed to move forward. Rx's for insulin and syringes sent to pharmacy on record.   I also informed patient of potential use of Omnipod insulin delivery system. I will follow up with Omnipod Rep to see about using that in place of syringes. Patient very interested in that option.   Insulin Instruction:   Insulin Action of NPH and Humalog/Novolog insulins  Reviewed syringe & vial VS pen including # units per syringe,    length of needles, vial   Hygiene and storage  Drawing up single and mixed doses if using vials   Single dose   Mixed dose:   Rotation of Sites  Hypoglycemia- symptoms, causes , treatment choices  Record keeping and MD follow up   Patient demonstrated understanding of insulin administration by return demonstration.                                        Patient to start on insulin based on 205# = 93 kg and at 13 weeks: AM: 28 units NPH        14 units Humalog  Pre-supper: 10 units NPH                      10 units Humalog  *  Patient eats supper after 8 PM so I agreed she could mix the 2 insulins before supper into 1 syringe.    Patient instructed to monitor glucose levels: FBS: 60 - 95 mg/dl 2 hour: <120 mg/dl  Patient received the following handouts:  Nutrition Diabetes and Pregnancy  Carbohydrate Counting List  Insulin Instruction Handout   Patient will be seen for follow-up in 1 week and as needed.

## 2018-01-21 ENCOUNTER — Ambulatory Visit: Payer: Medicaid Other | Admitting: *Deleted

## 2018-01-21 ENCOUNTER — Encounter: Payer: Medicaid Other | Admitting: *Deleted

## 2018-01-21 DIAGNOSIS — E1165 Type 2 diabetes mellitus with hyperglycemia: Secondary | ICD-10-CM

## 2018-01-21 DIAGNOSIS — Z3A00 Weeks of gestation of pregnancy not specified: Secondary | ICD-10-CM | POA: Diagnosis not present

## 2018-01-21 DIAGNOSIS — O24111 Pre-existing diabetes mellitus, type 2, in pregnancy, first trimester: Secondary | ICD-10-CM | POA: Diagnosis not present

## 2018-01-21 NOTE — Progress Notes (Addendum)
Patient was seen on 01/21/2018 for type 2 Diabetes and pregnancy self-management follow up visit. EDD 07/23/2018. Patient states history of this diabetes for several years. 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 She states her food intake has been more irratic this past week including a birthday party for her daughter on Saturday. She started on insulin on 01/15/18 and brought her Log Book today.  FBG are all elevated but improved in the 120-150 mg/dl Post meal BG's are all elevated ranging 135-over 200 mg/dl.   except for bedtime which are under 120 mg/dl.  Insulin dose suggestions reviewed with Dr. Willodean Rosenthalarolyn Harraway-Smith and she agreed with the following increases:                                        AM: 28 units NPH increased to 34 units        14 units Humalog increased to 20 units  Pre-supper: 10 units NPH increased to 14 units                      10 units Humalog no change as post supper BG under 120 mg/dl  *Patient eats supper after 8 PM so I agreed she could mix the 2 insulins before supper into 1 syringe.   Patient is interested in pursuing Omnipod insulin delivery device. I have emailed Fanny DanceBrett Toler, the local Rep with her contact information at her request. Dr. Jolayne Pantheronstant will be notified as she is aware of the Omnipod product and she and I discussed this option for this patient last week.   Patient instructed to monitor glucose levels: FBS: 60 - 95 mg/dl 2 hour: <478<120 mg/dl  Patient received the following handouts:  Updated insulin orders  Patient will be seen for follow-up in 2 week and as needed.  Attestation of Attending Supervision of RN: Evaluation and management procedures were performed by the nurse under my supervision and collaboration.  I have reviewed the nursing note and chart, and I agree with the management and plan.  Carolyn L. Harraway-Smith, M.D., Evern CoreFACOG

## 2018-01-27 ENCOUNTER — Ambulatory Visit (INDEPENDENT_AMBULATORY_CARE_PROVIDER_SITE_OTHER): Payer: Medicaid Other | Admitting: Obstetrics & Gynecology

## 2018-01-27 VITALS — BP 136/73 | HR 84 | Wt 206.8 lb

## 2018-01-27 DIAGNOSIS — E1165 Type 2 diabetes mellitus with hyperglycemia: Secondary | ICD-10-CM

## 2018-01-27 DIAGNOSIS — O24112 Pre-existing diabetes mellitus, type 2, in pregnancy, second trimester: Secondary | ICD-10-CM

## 2018-01-27 DIAGNOSIS — Z3A14 14 weeks gestation of pregnancy: Secondary | ICD-10-CM

## 2018-01-27 DIAGNOSIS — O0992 Supervision of high risk pregnancy, unspecified, second trimester: Secondary | ICD-10-CM

## 2018-01-27 DIAGNOSIS — Z8751 Personal history of pre-term labor: Secondary | ICD-10-CM

## 2018-01-27 DIAGNOSIS — Z148 Genetic carrier of other disease: Secondary | ICD-10-CM | POA: Insufficient documentation

## 2018-01-27 DIAGNOSIS — O0990 Supervision of high risk pregnancy, unspecified, unspecified trimester: Secondary | ICD-10-CM

## 2018-01-27 LAB — POCT URINALYSIS DIP (DEVICE)
Bilirubin Urine: NEGATIVE
Glucose, UA: NEGATIVE mg/dL
HGB URINE DIPSTICK: NEGATIVE
Ketones, ur: 15 mg/dL — AB
LEUKOCYTES UA: NEGATIVE
NITRITE: NEGATIVE
PH: 5 (ref 5.0–8.0)
PROTEIN: NEGATIVE mg/dL
UROBILINOGEN UA: 0.2 mg/dL (ref 0.0–1.0)

## 2018-01-27 MED ORDER — CETIRIZINE HCL 10 MG PO TABS: 10.0000 mg | ORAL_TABLET | Freq: Every day | ORAL | 3 refills | Status: DC

## 2018-01-27 MED ORDER — METFORMIN HCL 500 MG PO TABS: 500.0000 mg | ORAL_TABLET | Freq: Two times a day (BID) | ORAL | 2 refills | Status: DC

## 2018-01-27 MED ORDER — INSULIN NPH (HUMAN) (ISOPHANE) 100 UNIT/ML ~~LOC~~ SUSP: SUBCUTANEOUS | 10 refills | Status: DC

## 2018-01-27 NOTE — Progress Notes (Signed)
Fetal echo scheduled for January 13 at 10:30 and 17p paperwork completed. Pt made aware.

## 2018-01-27 NOTE — Progress Notes (Signed)
Scheduled genetic counseling appt 12/4 @ 11am

## 2018-01-27 NOTE — Progress Notes (Signed)
   PRENATAL VISIT NOTE  Subjective:  Dominique Mora is a 37 y.o. [phone] at 33101w5d being seen today for ongoing prenatal care.  She is currently monitored for the following issues for this high-risk pregnancy and has Asthma; PCO (polycystic ovaries); Hypothyroid; Poor dentition; GAD (generalized anxiety disorder); Tobacco abuse; DM (diabetes mellitus) (HCC); Supervision of high risk pregnancy, antepartum; History of preterm delivery; H/O cesarean section; Pre-existing type 2 diabetes mellitus with hyperglycemia during pregnancy in second trimester [facility](HCC); and Spinal muscular atrophy carrier (has one copy) on their problem list.  Patient reports no complaints.  Contractions: Not present. Vag. Bleeding: None.  Movement: Present. Denies leaking of fluid.   The following portions of the patient's history were reviewed and updated as appropriate: allergies, current medications, past family history, past medical history, past social history, past surgical history and problem list. Problem list updated.  Objective:   Vitals:   01/27/18 1114  BP: 136/73  Pulse: 84  Weight: 206 lb 12.8 oz (93.8 kg)    Fetal Status: Fetal Heart Rate (bpm): 158   Movement: Present     General:  Alert, oriented and cooperative. Patient is in no acute distress.  Skin: Skin is warm and dry. No rash noted.   Cardiovascular: Normal heart rate noted  Respiratory: Normal respiratory effort, no problems with respiration noted  Abdomen: Soft, gravid, appropriate for gestational age.  Pain/Pressure: Absent     Pelvic: Cervical exam deferred        Extremities: Normal range of motion.  Edema: None  Mental Status: Normal mood and affect. Normal behavior. Normal judgment and thought content.   Assessment and Plan:  Pregnancy: [id number] at 67101w5d  1. Pre-existing type 2 diabetes mellitus with hyperglycemia during pregnancy in second trimester (HCC) Fastings 130-150s, some elevated postprandials.  Increased NPH at night time to  18 and added Metformin back. Will reevaluate in two weeks. Will arrange for eye exam soon.  Fetal ECHO ordered for patient. - POCT urinalysis dip (device) - metFORMIN (GLUCOPHAGE) 500 MG tablet; Take 1 tablet (500 mg total) by mouth 2 (two) times daily with a meal.  Dispense: 60 tablet; Refill: 2 - US Fetal Echocardiography; Future - insulin NPH Human (HUMULIN N,NOVOLIN N) 100 UNIT/ML injection; 34 units breakfast & 18 units supper  Dispense: 10 mL; Refill: 10  2. History of preterm delivery Will start 17P in next two weeks, it will be ordered this week.  3. Spinal muscular atrophy carrier (has one copy) Discussed implications of this, need for genetic counseling - AMB MFM GENETICS REFERRAL  4. Supervision of high risk pregnancy, antepartum This was refilled as per patient's request. - cetirizine (ZYRTEC) 10 MG tablet; Take 1 tablet (10 mg total) by mouth daily.  Dispense: 30 tablet; Refill: 3 Discussed low risk NIPS result. Anatomy scan scheduled. Will do AFP screen next visit.  No other complaints or concerns.  Routine obstetric precautions reviewed. Please refer to After Visit Summary for other counseling recommendations.  Return in about 2 weeks (around 02/10/2018) for 17P initiation, OB Visit (HOB).  Future Appointments  Date Time Provider Department Center  02/04/2018 10:00 AM WOC-EDUCATION WOC-WOCA WOC  02/20/2018  2:00 PM WH-MFC US 3 WH-MFCUS MFC-US    Jaynie CollinsUgonna Jaquelynn Wanamaker, MD

## 2018-01-27 NOTE — Patient Instructions (Signed)
Return to clinic for any scheduled appointments or obstetric concerns, or go to [facility] for evaluation  

## 2018-01-30 DIAGNOSIS — O24424 Gestational diabetes mellitus in childbirth, insulin controlled: Secondary | ICD-10-CM | POA: Diagnosis not present

## 2018-02-03 ENCOUNTER — Ambulatory Visit: Payer: Self-pay

## 2018-02-04 ENCOUNTER — Encounter: Payer: Medicaid Other | Attending: Obstetrics and Gynecology | Admitting: *Deleted

## 2018-02-04 ENCOUNTER — Ambulatory Visit: Payer: Self-pay

## 2018-02-04 ENCOUNTER — Ambulatory Visit: Payer: Medicaid Other | Admitting: *Deleted

## 2018-02-04 DIAGNOSIS — O24111 Pre-existing diabetes mellitus, type 2, in pregnancy, first trimester: Secondary | ICD-10-CM | POA: Diagnosis not present

## 2018-02-04 DIAGNOSIS — Z3A00 Weeks of gestation of pregnancy not specified: Secondary | ICD-10-CM | POA: Insufficient documentation

## 2018-02-04 DIAGNOSIS — E1165 Type 2 diabetes mellitus with hyperglycemia: Secondary | ICD-10-CM | POA: Insufficient documentation

## 2018-02-05 ENCOUNTER — Other Ambulatory Visit: Payer: Self-pay | Admitting: General Practice

## 2018-02-05 DIAGNOSIS — E119 Type 2 diabetes mellitus without complications: Secondary | ICD-10-CM

## 2018-02-05 DIAGNOSIS — E1165 Type 2 diabetes mellitus with hyperglycemia: Secondary | ICD-10-CM

## 2018-02-05 DIAGNOSIS — O24112 Pre-existing diabetes mellitus, type 2, in pregnancy, second trimester: Principal | ICD-10-CM

## 2018-02-05 MED ORDER — INSULIN LISPRO 100 UNIT/ML ~~LOC~~ SOLN: SUBCUTANEOUS | 11 refills | Status: DC

## 2018-02-11 ENCOUNTER — Ambulatory Visit (INDEPENDENT_AMBULATORY_CARE_PROVIDER_SITE_OTHER): Payer: Medicaid Other | Admitting: Obstetrics & Gynecology

## 2018-02-11 ENCOUNTER — Telehealth: Payer: Self-pay | Admitting: *Deleted

## 2018-02-11 VITALS — BP 129/72 | HR 92 | Wt 210.9 lb

## 2018-02-11 DIAGNOSIS — Z8751 Personal history of pre-term labor: Secondary | ICD-10-CM

## 2018-02-11 DIAGNOSIS — R200 Anesthesia of skin: Secondary | ICD-10-CM

## 2018-02-11 DIAGNOSIS — O24112 Pre-existing diabetes mellitus, type 2, in pregnancy, second trimester: Secondary | ICD-10-CM

## 2018-02-11 DIAGNOSIS — O0990 Supervision of high risk pregnancy, unspecified, unspecified trimester: Secondary | ICD-10-CM | POA: Diagnosis not present

## 2018-02-11 DIAGNOSIS — Z98891 History of uterine scar from previous surgery: Secondary | ICD-10-CM

## 2018-02-11 DIAGNOSIS — E1165 Type 2 diabetes mellitus with hyperglycemia: Secondary | ICD-10-CM

## 2018-02-11 DIAGNOSIS — O0992 Supervision of high risk pregnancy, unspecified, second trimester: Secondary | ICD-10-CM

## 2018-02-11 DIAGNOSIS — O09212 Supervision of pregnancy with history of pre-term labor, second trimester: Secondary | ICD-10-CM

## 2018-02-11 DIAGNOSIS — E031 Congenital hypothyroidism without goiter: Secondary | ICD-10-CM

## 2018-02-11 MED ORDER — HYDROXYPROGESTERONE CAPROATE 250 MG/ML IM OIL
250.0000 mg | TOPICAL_OIL | Freq: Once | INTRAMUSCULAR | Status: AC
  Administered 2018-02-11: 250 mg via INTRAMUSCULAR

## 2018-02-11 NOTE — Addendum Note (Signed)
Addended by: Ernestina PatchesAPEL, Anwyn Kriegel S on: 02/11/2018 11:52 AM   Modules accepted: Orders

## 2018-02-11 NOTE — Progress Notes (Signed)
   PRENATAL VISIT NOTE  Subjective:  Dominique Mora is a 37 y.o. [phone] at 10740w6d being seen today for ongoing prenatal care.  She is currently monitored for the following issues for this high-risk pregnancy and has Asthma; PCO (polycystic ovaries); Hypothyroid; Poor dentition; GAD (generalized anxiety disorder); Tobacco abuse; DM (diabetes mellitus) (HCC); Supervision of high risk pregnancy, antepartum; History of preterm delivery; H/O cesarean section; Pre-existing type 2 diabetes mellitus with hyperglycemia during pregnancy in second trimester (HCC); and Spinal muscular atrophy carrier (has one copy) on their problem list.  Patient reports no complaints except having some right arm and finger numbness  Contractions: Not present. Vag. Bleeding: None.  Movement: Present. Denies leaking of fluid.   The following portions of the patient's history were reviewed and updated as appropriate: allergies, current medications, past family history, past medical history, past social history, past surgical history and problem list. Problem list updated.  Objective:   Vitals:   02/11/18 1131  BP: 129/72  Pulse: 92  Weight: 210 lb 14.4 oz (95.7 kg)    Fetal Status:     Movement: Present     General:  Alert, oriented and cooperative. Patient is in no acute distress.  Skin: Skin is warm and dry. No rash noted.   Cardiovascular: Normal heart rate noted  Respiratory: Normal respiratory effort, no problems with respiration noted  Abdomen: Soft, gravid, appropriate for gestational age.  Pain/Pressure: Absent     Pelvic: Cervical exam deferred        Extremities: Normal range of motion.  Edema: None  Mental Status: Normal mood and affect. Normal behavior. Normal judgment and thought content.   Assessment and Plan:  Pregnancy: [id number] at 3140w6d  1. Pre-existing type 2 diabetes mellitus with hyperglycemia during pregnancy in second trimester (HCC) - on the Omni Pod, sugars fairly good - [place]  2. Supervision of high risk pregnancy, antepartum  - AFP, Serum, Open Spina Bifida  3. Congenital hypothyroidism without goiter   4. History of preterm delivery -17 P weekly  5. H/O cesarean section - uncertain what she wants at this time  6. Right arm numbness- refer to neurology  Preterm labor symptoms and general obstetric precautions including but not limited to vaginal bleeding, contractions, leaking of fluid and fetal movement were reviewed in detail with the patient. Please refer to After Visit Summary for other counseling recommendations.  Return in about 2 weeks (around 02/25/2018).  Future Appointments  Date Time Provider Department Center  02/12/2018 11:00 AM WH-MFC GENETIC COUNSELING RM WH-MFC MFC-US  02/13/2018 11:00 AM WOC-EDUCATION WOC-WOCA WOC  02/20/2018  2:00 PM WH-MFC US 3 WH-MFCUS MFC-US    Allie BossierMyra C Taysean Wager, MD

## 2018-02-11 NOTE — Telephone Encounter (Signed)
02/10/2018  Fasting BG improved after last increase in basal rate to 1.9 units/hourl but still slightly elevated between 95-100 mg/dl. Post meal BG are within target range about 50% of the time, otherwise elevated between 130-175 mg/dl.  Plan: Increase basal rate be 10% from 1.9 to 2.2 units/hour x 24 hours  Increase meal time insulin from 10 to 15 units for each meal If snacking, give 10 units insulin for lower carb intake.  Patient to notify me if any hypoglycemia or BG's above 200 mg/dl.  [facility], [facility]12 at 1 PM. This is because she has an appointment there and it is a convenient location.

## 2018-02-11 NOTE — Progress Notes (Signed)
Insulin Pump/CGM Start Progress Note on 02/04/2018  Patient was trained by Cristal DeerLaura Hite, RN, CDE and pump trainer for Omnipod, with me in attendance  Patient here for insulin pump start on Omnipod pump  Orders with pump settings received from Catalina AntiguaPeggy Constant, MD who  Reviewed Pump Set Up including    Bolus Settings:    10 units per meal, 5 units per snack   Insulin Sensitivity Factor NONE              Target: NA  Basal with initial Basal Rate of 1.70 units/hour x 24 hours  Pod Set Up  Utilities  Pump Training Checklist completed  Patient successfully completed pump start and instructed to call me if BG drops below 60 mg/dl or goes above 161200 mg/dl or as directed by MD  Follow up plan: with upcoming Thanksgiving Holiday, plan to contact patient by phone every 2-3 days to check on progress and BG control. Patient agrees and has my cell number for this communication. Visit follow appt. Set for 2 weeks with me.

## 2018-02-11 NOTE — Telephone Encounter (Signed)
Patient states fasting BG the last 2 days have been 119 and 125 mg/dl I asked her to increase her basal rate by 10% from 1.7 to 1.9 units / hour, which she stated she did.  She states no hypoglycemia She states she is bolusing @ 10 units per meal and if snacking, agreed to 5 units for lower carb intake.   Plan another follow up in 2 days; Monday AM.

## 2018-02-12 ENCOUNTER — Ambulatory Visit (HOSPITAL_COMMUNITY): Payer: Self-pay

## 2018-02-12 ENCOUNTER — Ambulatory Visit (HOSPITAL_COMMUNITY): Admission: RE | Admit: 2018-02-12 | Payer: Medicaid Other | Source: Ambulatory Visit

## 2018-02-12 ENCOUNTER — Ambulatory Visit (HOSPITAL_COMMUNITY)
Admission: RE | Admit: 2018-02-12 | Discharge: 2018-02-12 | Disposition: A | Discharge status: Home / Self Care | Payer: Medicaid Other | Source: Ambulatory Visit | Attending: Obstetrics & Gynecology | Admitting: Obstetrics & Gynecology

## 2018-02-13 ENCOUNTER — Other Ambulatory Visit: Payer: Self-pay

## 2018-02-13 LAB — AFP, SERUM, OPEN SPINA BIFIDA
AFP MoM: 2.02
AFP Value: 47.2 ng/mL
GEST. AGE ON COLLECTION DATE: 16.6 wk
MATERNAL AGE AT EDD: 38.6 a
OSBR Risk 1 IN: 230
Test Results:: POSITIVE — AB
WEIGHT: 210 [lb_av]

## 2018-02-18 ENCOUNTER — Ambulatory Visit (INDEPENDENT_AMBULATORY_CARE_PROVIDER_SITE_OTHER): Payer: Medicaid Other | Admitting: *Deleted

## 2018-02-18 VITALS — BP 118/66 | HR 81 | Wt 210.2 lb

## 2018-02-18 DIAGNOSIS — O09899 Supervision of other high risk pregnancies, unspecified trimester: Secondary | ICD-10-CM

## 2018-02-18 DIAGNOSIS — O09212 Supervision of pregnancy with history of pre-term labor, second trimester: Secondary | ICD-10-CM

## 2018-02-18 DIAGNOSIS — O09219 Supervision of pregnancy with history of pre-term labor, unspecified trimester: Principal | ICD-10-CM

## 2018-02-18 MED ORDER — HYDROXYPROGESTERONE CAPROATE 250 MG/ML IM OIL
250.0000 mg | TOPICAL_OIL | Freq: Once | INTRAMUSCULAR | Status: AC
  Administered 2018-02-18: 250 mg via INTRAMUSCULAR

## 2018-02-18 NOTE — Progress Notes (Signed)
[name] here for 17-P  Injection.  Injection administered without complication. Patient will return in one week for next injection.  Linda,RN 02/18/2018  11:07 AM

## 2018-02-19 NOTE — Progress Notes (Signed)
I have reviewed this chart and agree with the RN/CMA assessment and management.    K. Meryl Davis, M.D. Attending [facility] (Faculty Practice)   

## 2018-02-20 ENCOUNTER — Encounter (HOSPITAL_COMMUNITY): Payer: Self-pay

## 2018-02-20 ENCOUNTER — Ambulatory Visit (HOSPITAL_COMMUNITY)
Admission: RE | Admit: 2018-02-20 | Discharge: 2018-02-20 | Disposition: A | Discharge status: Home / Self Care | Payer: Medicaid Other | Source: Ambulatory Visit | Attending: Obstetrics & Gynecology | Admitting: Obstetrics & Gynecology

## 2018-02-20 ENCOUNTER — Encounter: Payer: Medicaid Other | Attending: Obstetrics and Gynecology | Admitting: *Deleted

## 2018-02-20 DIAGNOSIS — O34219 Maternal care for unspecified type scar from previous cesarean delivery: Secondary | ICD-10-CM | POA: Diagnosis not present

## 2018-02-20 DIAGNOSIS — E1165 Type 2 diabetes mellitus with hyperglycemia: Secondary | ICD-10-CM

## 2018-02-20 DIAGNOSIS — Z3A00 Weeks of gestation of pregnancy not specified: Secondary | ICD-10-CM | POA: Insufficient documentation

## 2018-02-20 DIAGNOSIS — O289 Unspecified abnormal findings on antenatal screening of mother: Secondary | ICD-10-CM | POA: Diagnosis not present

## 2018-02-20 DIAGNOSIS — O09522 Supervision of elderly multigravida, second trimester: Secondary | ICD-10-CM | POA: Diagnosis not present

## 2018-02-20 DIAGNOSIS — O99332 Smoking (tobacco) complicating pregnancy, second trimester: Secondary | ICD-10-CM | POA: Insufficient documentation

## 2018-02-20 DIAGNOSIS — O0990 Supervision of high risk pregnancy, unspecified, unspecified trimester: Secondary | ICD-10-CM

## 2018-02-20 DIAGNOSIS — O09212 Supervision of pregnancy with history of pre-term labor, second trimester: Secondary | ICD-10-CM | POA: Insufficient documentation

## 2018-02-20 DIAGNOSIS — O99282 Endocrine, nutritional and metabolic diseases complicating pregnancy, second trimester: Secondary | ICD-10-CM

## 2018-02-20 DIAGNOSIS — O24112 Pre-existing diabetes mellitus, type 2, in pregnancy, second trimester: Secondary | ICD-10-CM | POA: Insufficient documentation

## 2018-02-20 DIAGNOSIS — Z363 Encounter for antenatal screening for malformations: Secondary | ICD-10-CM | POA: Diagnosis not present

## 2018-02-20 DIAGNOSIS — Z3A18 18 weeks gestation of pregnancy: Secondary | ICD-10-CM | POA: Insufficient documentation

## 2018-02-20 DIAGNOSIS — E039 Hypothyroidism, unspecified: Secondary | ICD-10-CM | POA: Diagnosis not present

## 2018-02-20 DIAGNOSIS — Z148 Genetic carrier of other disease: Secondary | ICD-10-CM | POA: Insufficient documentation

## 2018-02-20 DIAGNOSIS — O24111 Pre-existing diabetes mellitus, type 2, in pregnancy, first trimester: Secondary | ICD-10-CM | POA: Diagnosis not present

## 2018-02-20 NOTE — Progress Notes (Signed)
02/20/2018  Fasting BG improved after last increase in basal rate to 2.2 units/hour with range of 84-99 mg/dl. Post meal BG for lunch and supper are within target range 82-128 with occasional excursions up to 200. Her post breakfast numbers are all too high so I will ask her to increase her breakfast meal insulin by 5 units today.   Plan: Continue basal rate of 2.2 units/hour x 24 hours  Increase breakfast meal time insulin to 25 units Continue lunch and supper meal time insulin @ 20 units If snacking, give 10 units insulin for lower carb intake.  Patient to notify me if any hypoglycemia or BG's above 200 mg/dl.  Plan [facility] 2nd[facility] where she will already have an appointment for weekly injection.

## 2018-02-21 ENCOUNTER — Other Ambulatory Visit (HOSPITAL_COMMUNITY): Payer: Self-pay | Admitting: *Deleted

## 2018-02-21 DIAGNOSIS — Z794 Long term (current) use of insulin: Principal | ICD-10-CM

## 2018-02-21 DIAGNOSIS — O24312 Unspecified pre-existing diabetes mellitus in pregnancy, second trimester: Principal | ICD-10-CM

## 2018-02-21 DIAGNOSIS — IMO0001 Reserved for inherently not codable concepts without codable children: Secondary | ICD-10-CM

## 2018-02-26 ENCOUNTER — Encounter (HOSPITAL_COMMUNITY): Payer: Self-pay

## 2018-02-26 ENCOUNTER — Ambulatory Visit (INDEPENDENT_AMBULATORY_CARE_PROVIDER_SITE_OTHER): Payer: Medicaid Other | Admitting: Obstetrics & Gynecology

## 2018-02-26 ENCOUNTER — Other Ambulatory Visit: Payer: Self-pay

## 2018-02-26 VITALS — BP 119/64 | HR 83 | Wt 210.5 lb

## 2018-02-26 DIAGNOSIS — O99210 Obesity complicating pregnancy, unspecified trimester: Secondary | ICD-10-CM

## 2018-02-26 DIAGNOSIS — O09212 Supervision of pregnancy with history of pre-term labor, second trimester: Secondary | ICD-10-CM

## 2018-02-26 DIAGNOSIS — E031 Congenital hypothyroidism without goiter: Secondary | ICD-10-CM

## 2018-02-26 DIAGNOSIS — E1165 Type 2 diabetes mellitus with hyperglycemia: Secondary | ICD-10-CM

## 2018-02-26 DIAGNOSIS — O24112 Pre-existing diabetes mellitus, type 2, in pregnancy, second trimester: Secondary | ICD-10-CM

## 2018-02-26 DIAGNOSIS — Z8751 Personal history of pre-term labor: Secondary | ICD-10-CM

## 2018-02-26 DIAGNOSIS — O0990 Supervision of high risk pregnancy, unspecified, unspecified trimester: Secondary | ICD-10-CM

## 2018-02-26 MED ORDER — HYDROXYPROGESTERONE CAPROATE 250 MG/ML IM OIL
250.0000 mg | TOPICAL_OIL | INTRAMUSCULAR | Status: AC
  Administered 2018-02-26 – 2018-06-12 (×14): 250 mg via INTRAMUSCULAR

## 2018-02-26 NOTE — Progress Notes (Signed)
   PRENATAL VISIT NOTE  Subjective:  Dominique Mora is a 37 y.o. [phone] at 1638w0d being seen today for ongoing prenatal care.  She is currently monitored for the following issues for this high-risk pregnancy and has Asthma; PCO (polycystic ovaries); Hypothyroid; Poor dentition; GAD (generalized anxiety disorder); Tobacco abuse; DM (diabetes mellitus) (HCC); Supervision of high risk pregnancy, antepartum; History of preterm delivery; H/O cesarean section; Pre-existing type 2 diabetes mellitus with hyperglycemia during pregnancy in second trimester (HCC); Spinal muscular atrophy carrier (has one copy); and Obesity in pregnancy on their problem list.  Patient reports no complaints.  Contractions: Not present. Vag. Bleeding: None.  Movement: Present. Denies leaking of fluid.   The following portions of the patient's history were reviewed and updated as appropriate: allergies, current medications, past family history, past medical history, past social history, past surgical history and problem list. Problem list updated.  Objective:   Vitals:   02/26/18 0947  BP: 119/64  Pulse: 83  Weight: 210 lb 8 oz (95.5 kg)    Fetal Status: Fetal Heart Rate (bpm): 142   Movement: Present     General:  Alert, oriented and cooperative. Patient is in no acute distress.  Skin: Skin is warm and dry. No rash noted.   Cardiovascular: Normal heart rate noted  Respiratory: Normal respiratory effort, no problems with respiration noted  Abdomen: Soft, gravid, appropriate for gestational age.  Pain/Pressure: Absent     Pelvic: Cervical exam deferred        Extremities: Normal range of motion.  Edema: Trace  Mental Status: Normal mood and affect. Normal behavior. Normal judgment and thought content.   Assessment and Plan:  Pregnancy: [id number] at 1938w0d  1. History of preterm delivery -17 P weekly  2. Congenital hypothyroidism without goiter -currently TSH normal, will need a re check q trimester  3.  Pre-existing type 2 diabetes mellitus with hyperglycemia during pregnancy in second trimester (HCC) - She has very few sugars recorded, [facility] - she has an Omnipod - fetal echo in January - MFM u/s q 4 weeks - start BPP weekly at 32 weeks  4. Supervision of high risk pregnancy, antepartum   5. Obesity in pregnancy   Preterm labor symptoms and general obstetric precautions including but not limited to vaginal bleeding, contractions, leaking of fluid and fetal movement were reviewed in detail with the patient. Please refer to After Visit Summary for other counseling recommendations.  No follow-ups on file.  Future Appointments  Date Time Provider Department Center  03/06/2018  2:15 PM Romeo BingPickens, Charlie, MD [facility]  03/13/2018 10:00 AM WOC-EDUCATION WOC-WOCA WOC  03/20/2018 12:45 PM WH-MFC US 2 WH-MFCUS MFC-US    Allie BossierMyra C Braidan Ricciardi, MD

## 2018-02-26 NOTE — Addendum Note (Signed)
Addended by: Faythe CasaBELLAMY, Lanette Ell M on: 02/26/2018 10:12 AM   Modules accepted: Orders

## 2018-02-28 DIAGNOSIS — O24424 Gestational diabetes mellitus in childbirth, insulin controlled: Secondary | ICD-10-CM | POA: Diagnosis not present

## 2018-03-03 ENCOUNTER — Other Ambulatory Visit: Payer: Self-pay | Admitting: *Deleted

## 2018-03-03 DIAGNOSIS — E039 Hypothyroidism, unspecified: Secondary | ICD-10-CM

## 2018-03-03 MED ORDER — LEVOTHYROXINE SODIUM 50 MCG PO TABS: 50.0000 ug | ORAL_TABLET | Freq: Every day | ORAL | 1 refills | Status: DC

## 2018-03-03 NOTE — Progress Notes (Signed)
Fax received from [facility] requesting refill of Levothyroxine. Rx e-prescribed.

## 2018-03-06 ENCOUNTER — Encounter: Payer: Self-pay | Admitting: Obstetrics and Gynecology

## 2018-03-06 ENCOUNTER — Ambulatory Visit (INDEPENDENT_AMBULATORY_CARE_PROVIDER_SITE_OTHER): Payer: Medicaid Other

## 2018-03-06 VITALS — BP 129/84 | HR 82 | Wt 209.3 lb

## 2018-03-06 DIAGNOSIS — Z8751 Personal history of pre-term labor: Secondary | ICD-10-CM

## 2018-03-06 DIAGNOSIS — O09212 Supervision of pregnancy with history of pre-term labor, second trimester: Secondary | ICD-10-CM | POA: Diagnosis not present

## 2018-03-06 NOTE — Progress Notes (Signed)
Dominique Mora here for 17-P  Injection.  Injection administered without complication. Patient will return in one week for next injection.  Contacted [facility] @ [phone] to request a refill on pt's medication.  Richardson Doppole, pharmacist, informed me that he will be able to send to our office on Monday.   Ralene BatheJeanetta Roshard Rezabek, RN 03/06/2018  9:46 AM

## 2018-03-07 NOTE — Progress Notes (Signed)
I have reviewed this chart and agree with the RN/CMA assessment and management.    Letta Cargile C Ladarian Bonczek, MD, FACOG Attending Physician, Faculty Practice [facility]  

## 2018-03-10 ENCOUNTER — Telehealth: Payer: Self-pay | Admitting: *Deleted

## 2018-03-10 MED ORDER — OSELTAMIVIR PHOSPHATE 75 MG PO CAPS: 75.0000 mg | ORAL_CAPSULE | Freq: Two times a day (BID) | ORAL | 0 refills | Status: DC

## 2018-03-10 NOTE — Telephone Encounter (Signed)
Pt left message stating that she thinks she may have the flu. She has been sick for 3 days and her "whole house is infected". She has cough, sore throat, sore ears and low grade fever. She wants to know if she needs to be seen and if so where. Following consult w/Heather Mathews RobinsonsHogan, CNM I called pt and discussed her concerns. Pt stated that 2 of her children tested positive for Influenza B last week at Urgent care. I advised pt that I would send in Rx for Tamiflu to her pharmacy. She should treat her sx with OTC meds per previously given information. Pt requested to cancel her Diabetes appt on 1/2 and would like to speak with Bev over the phone to review CBG values. Pt advised not to come to office on 1/3 for appt unless she has been fever free for at least 24 hrs without the use of tylenol. Pt voiced understanding of all information and instructions given.

## 2018-03-13 ENCOUNTER — Telehealth: Payer: Self-pay | Admitting: *Deleted

## 2018-03-13 ENCOUNTER — Other Ambulatory Visit: Payer: Self-pay

## 2018-03-13 NOTE — Telephone Encounter (Signed)
Patient states she has not written her BG numbers down, she plans to review her meter and record them on the Log sheet later today. I offered to call her back this afternoon when that is completed.  I encouraged her to be enrolled in Baby [employer]s so she could put BG numbers into her phone instead of writing them down. She agreed to try that so I will enroll her.   I will offer another follow up visit in 2 weeks for her.

## 2018-03-13 NOTE — Telephone Encounter (Signed)
Bev Paddock, Diabetes Educator informed me that pt is still sick with flu, feels very bad and is not going to keep scheduled office appt tomorrow for prenatal visit. Pt informed Bev that she was going to come in to [facility] for evaluation of her illness. I called pt to discuss her concerns. She stated that she is worried she may have pneumonia because her "lungs hurt". I advised pt she needs to be seen for evaluation @ Urgent Care and not [facility]. Pt voiced understanding and will keep next scheduled appt on 1/8.

## 2018-03-13 NOTE — Telephone Encounter (Signed)
Patient sent me pictures of her BG Log Book as she is too sick to come in for appointment.      FBG's   Post Breakfast  Post Lunch Post Dinner Week of 12/16: 5 days @ 88 or less  86, 77 & 144  156 & 189 No readings     2 days with 106 & 118  Week of 12/23   4 readings of 80-99  167 & 255  137 & 81 No readings      2 days with 120 & 139  Week of 12/30   3 readings of 87-91  132   99  No readings     1 day with 113  FBG are in pretty good control so no change to Basal Rate of Omnipod which is 2.2 units/hour x 24 hours  Post meal numbers are too high. I asked her to increase her set bolus as follows: Breakfast increase from 25 to 30 units (when eating a full meal) Lunch increase from 20 to 25 units (when eating a full meal) Dinner there are no post dinner numbers but her fasting numbers look good, so no change to dinner dose of 20 units  I discussed with patient earlier today by phone to consider using Baby Scripts app, she agreed to try it. I reinforced importance of recording her numbers within 24 hours, I explained that the data is electronically sent in to our department and she would no longer have to write her numbers down.   Follow up visit scheduled for 2 weeks on same day as MD appointment 03/27/2018

## 2018-03-14 ENCOUNTER — Encounter: Payer: Self-pay | Admitting: Obstetrics & Gynecology

## 2018-03-17 ENCOUNTER — Telehealth: Payer: Self-pay | Admitting: Family Medicine

## 2018-03-17 NOTE — Telephone Encounter (Signed)
Called patient to inform of the hospital currently under flu restrictions.

## 2018-03-19 ENCOUNTER — Ambulatory Visit (INDEPENDENT_AMBULATORY_CARE_PROVIDER_SITE_OTHER): Payer: Medicaid Other | Admitting: General Practice

## 2018-03-19 VITALS — BP 115/67 | HR 85 | Wt 213.0 lb

## 2018-03-19 DIAGNOSIS — O24112 Pre-existing diabetes mellitus, type 2, in pregnancy, second trimester: Secondary | ICD-10-CM | POA: Diagnosis not present

## 2018-03-19 DIAGNOSIS — O09212 Supervision of pregnancy with history of pre-term labor, second trimester: Secondary | ICD-10-CM

## 2018-03-19 DIAGNOSIS — E1165 Type 2 diabetes mellitus with hyperglycemia: Secondary | ICD-10-CM

## 2018-03-19 NOTE — Progress Notes (Signed)
Dominique Mora here for 17-P  Injection.  Injection administered without complication. Patient will return in one week for next injection.  Marylynn Pearson, RN 03/19/2018  9:10 AM

## 2018-03-19 NOTE — Progress Notes (Signed)
I have reviewed this chart and agree with the RN/CMA assessment and management.    K. Meryl Davis, M.D. Attending [facility] (Faculty Practice)   

## 2018-03-20 ENCOUNTER — Ambulatory Visit (HOSPITAL_COMMUNITY)
Admission: RE | Admit: 2018-03-20 | Discharge: 2018-03-20 | Disposition: A | Discharge status: Home / Self Care | Payer: Medicaid Other | Source: Ambulatory Visit | Attending: Obstetrics & Gynecology | Admitting: Obstetrics & Gynecology

## 2018-03-20 ENCOUNTER — Other Ambulatory Visit (HOSPITAL_COMMUNITY): Payer: Self-pay | Admitting: *Deleted

## 2018-03-20 ENCOUNTER — Encounter (HOSPITAL_COMMUNITY): Payer: Self-pay

## 2018-03-20 DIAGNOSIS — O09212 Supervision of pregnancy with history of pre-term labor, second trimester: Secondary | ICD-10-CM | POA: Diagnosis not present

## 2018-03-20 DIAGNOSIS — O34212 Maternal care for vertical scar from previous cesarean delivery: Secondary | ICD-10-CM

## 2018-03-20 DIAGNOSIS — O24112 Pre-existing diabetes mellitus, type 2, in pregnancy, second trimester: Secondary | ICD-10-CM | POA: Diagnosis not present

## 2018-03-20 DIAGNOSIS — O09522 Supervision of elderly multigravida, second trimester: Secondary | ICD-10-CM | POA: Diagnosis not present

## 2018-03-20 DIAGNOSIS — O99332 Smoking (tobacco) complicating pregnancy, second trimester: Secondary | ICD-10-CM | POA: Diagnosis not present

## 2018-03-20 DIAGNOSIS — Z3A22 22 weeks gestation of pregnancy: Secondary | ICD-10-CM | POA: Diagnosis not present

## 2018-03-20 DIAGNOSIS — O24312 Unspecified pre-existing diabetes mellitus in pregnancy, second trimester: Secondary | ICD-10-CM | POA: Insufficient documentation

## 2018-03-20 DIAGNOSIS — O289 Unspecified abnormal findings on antenatal screening of mother: Secondary | ICD-10-CM | POA: Diagnosis not present

## 2018-03-20 DIAGNOSIS — Z794 Long term (current) use of insulin: Secondary | ICD-10-CM | POA: Diagnosis not present

## 2018-03-20 DIAGNOSIS — IMO0001 Reserved for inherently not codable concepts without codable children: Secondary | ICD-10-CM

## 2018-03-20 DIAGNOSIS — O24119 Pre-existing diabetes mellitus, type 2, in pregnancy, unspecified trimester: Secondary | ICD-10-CM

## 2018-03-27 ENCOUNTER — Encounter: Payer: Self-pay | Admitting: Obstetrics & Gynecology

## 2018-03-27 ENCOUNTER — Ambulatory Visit (INDEPENDENT_AMBULATORY_CARE_PROVIDER_SITE_OTHER): Payer: Medicaid Other | Admitting: Obstetrics & Gynecology

## 2018-03-27 ENCOUNTER — Encounter: Payer: Medicaid Other | Attending: Obstetrics and Gynecology | Admitting: *Deleted

## 2018-03-27 ENCOUNTER — Ambulatory Visit: Payer: Medicaid Other | Admitting: *Deleted

## 2018-03-27 VITALS — BP 126/79 | HR 79 | Wt 211.5 lb

## 2018-03-27 DIAGNOSIS — Z3A23 23 weeks gestation of pregnancy: Secondary | ICD-10-CM

## 2018-03-27 DIAGNOSIS — Z3A00 Weeks of gestation of pregnancy not specified: Secondary | ICD-10-CM | POA: Diagnosis not present

## 2018-03-27 DIAGNOSIS — O24112 Pre-existing diabetes mellitus, type 2, in pregnancy, second trimester: Secondary | ICD-10-CM

## 2018-03-27 DIAGNOSIS — E1165 Type 2 diabetes mellitus with hyperglycemia: Secondary | ICD-10-CM | POA: Diagnosis not present

## 2018-03-27 DIAGNOSIS — O99212 Obesity complicating pregnancy, second trimester: Secondary | ICD-10-CM | POA: Diagnosis not present

## 2018-03-27 DIAGNOSIS — O24111 Pre-existing diabetes mellitus, type 2, in pregnancy, first trimester: Secondary | ICD-10-CM | POA: Diagnosis not present

## 2018-03-27 DIAGNOSIS — E039 Hypothyroidism, unspecified: Secondary | ICD-10-CM

## 2018-03-27 DIAGNOSIS — Z98891 History of uterine scar from previous surgery: Secondary | ICD-10-CM

## 2018-03-27 DIAGNOSIS — O0992 Supervision of high risk pregnancy, unspecified, second trimester: Secondary | ICD-10-CM

## 2018-03-27 DIAGNOSIS — Z8751 Personal history of pre-term labor: Secondary | ICD-10-CM

## 2018-03-27 DIAGNOSIS — O0990 Supervision of high risk pregnancy, unspecified, unspecified trimester: Secondary | ICD-10-CM

## 2018-03-27 DIAGNOSIS — O99210 Obesity complicating pregnancy, unspecified trimester: Secondary | ICD-10-CM

## 2018-03-27 NOTE — Progress Notes (Signed)
03/27/2018  Fasting BG improved, currently within target ranges of 80-95 mg/dl.  after last increase in basal rate to 2.9 units/hour with range of 84-99 mg/dl. Post meal BG for lunch and supper are within target range 107-119 with occasional excursions up to 200.   She is running out of pods as she is up to 140 units per day so her Rx needs to be increased to 20 pods per month. She has called her supplier; [employer] and they have agreed to send her some extra pods until her next shipment arrives next Monday. There is a form that needs to be completed to order more pods, I will work on this as well.   She also would like some info on barriers for her skin as the tape is causing some discomfort.   Plan: Continue basal rate of 2.9 units/hour x 24 hours  Continue  meal time insulin @ 30 units for all meals, that is working well for you currently If snacking, give 10 units insulin for lower carb intake.  Patient to notify me if any hypoglycemia or BG's above 200 mg/dl.  Plan follow up in 2 weeks (January 30th) in [facility], where she will already have an appointment for weekly injection.

## 2018-03-27 NOTE — Progress Notes (Signed)
   PRENATAL VISIT NOTE  Subjective:  Dominique Mora is a 38 y.o. G3P0111 at 101w1d being seen today for ongoing prenatal care.  She is currently monitored for the following issues for this high-risk pregnancy and has Asthma; PCO (polycystic ovaries); Hypothyroid; Poor dentition; GAD (generalized anxiety disorder); Tobacco abuse; DM (diabetes mellitus) (HCC); Supervision of high risk pregnancy, antepartum; History of preterm delivery; H/O cesarean section; Pre-existing type 2 diabetes mellitus with hyperglycemia during pregnancy in second trimester (HCC); Spinal muscular atrophy carrier (has one copy); and Obesity in pregnancy on their problem list.  Patient reports no complaints.  Contractions: Not present.  .  Movement: Present. Denies leaking of fluid.   The following portions of the patient's history were reviewed and updated as appropriate: allergies, current medications, past family history, past medical history, past social history, past surgical history and problem list. Problem list updated.  Objective:   Vitals:   03/27/18 0930  BP: 126/79  Pulse: 79  Weight: 211 lb 8 oz (95.9 kg)    Fetal Status: Fetal Heart Rate (bpm): 157   Movement: Present     General:  Alert, oriented and cooperative. Patient is in no acute distress.  Skin: Skin is warm and dry. No rash noted.   Cardiovascular: Normal heart rate noted  Respiratory: Normal respiratory effort, no problems with respiration noted  Abdomen: Soft, gravid, appropriate for gestational age.  Pain/Pressure: Absent     Pelvic: Cervical exam deferred        Extremities: Normal range of motion.  Edema: None  Mental Status: Normal mood and affect. Normal behavior. Normal judgment and thought content.   Assessment and Plan:  Pregnancy: [id number] at [redacted]w[redacted]d  1. Obesity in pregnancy - encourage no more than 20# total weight gain  2. H/O cesarean section - wants RLTCS  3. History of preterm delivery - on 17 P weekly  4. Supervision  of high risk pregnancy, antepartum -MFM u/s q 4 weeks  5. Pre-existing type 2 diabetes mellitus with hyperglycemia during pregnancy in second trimester (HCC) - relatively good sugars recorded.  - She is on the Omnipod, needs more pods  6. Hypothyroidism, unspecified type  - TSH  Preterm labor symptoms and general obstetric precautions including but not limited to vaginal bleeding, contractions, leaking of fluid and fetal movement were reviewed in detail with the patient. Please refer to After Visit Summary for other counseling recommendations.  No follow-ups on file.  Future Appointments  Date Time Provider Department Center  03/27/2018 10:00 AM WOC-EDUCATION WOC-WOCA WOC  04/02/2018  9:00 AM WOC-WOCA NURSE WOC-WOCA WOC  04/10/2018  8:55 AM Allie Bossier, MD WOC-WOCA WOC  04/17/2018  9:30 AM WH-MFC [place] 1 WH-MFCUS MFC-US    Allie Bossier, MD

## 2018-03-28 LAB — TSH: TSH: 2.15 u[IU]/mL (ref 0.450–4.500)

## 2018-04-02 ENCOUNTER — Ambulatory Visit (INDEPENDENT_AMBULATORY_CARE_PROVIDER_SITE_OTHER): Payer: Medicaid Other | Admitting: General Practice

## 2018-04-02 VITALS — BP 111/75 | HR 85 | Ht 64.0 in | Wt 214.0 lb

## 2018-04-02 DIAGNOSIS — Z8751 Personal history of pre-term labor: Secondary | ICD-10-CM

## 2018-04-02 NOTE — Progress Notes (Addendum)
Dominique JanuaryAmanda Mora here for 17-P  Injection.  Injection administered without complication. Patient will return in one week for next injection.  Dominique PearsonCarrie Braxtyn Dorff, RN 04/02/2018  9:23 AM      Note reviewed and agree with the plan of care  Nolene BernheimERRI BURLESON, RN, MSN, NP-BC Nurse Practitioner, [facility], [facility] 04/02/2018 10:02 AM

## 2018-04-04 ENCOUNTER — Telehealth: Payer: Self-pay | Admitting: *Deleted

## 2018-04-04 NOTE — Telephone Encounter (Signed)
Patient states she is on her last pod which holds 200 units. She called [employer] to check on delivery and was told her insurance needed to be re-verified every January and it was in a committee for that to happen.   I called [employer] myself to check on details, was transferred to the Verification Department and was told the pods would be shipped once verification was made. I explained the urgency and the she had been waiting a week already. I was told that the person I was talking to would take her out of that Verification Group and do it herself and that the pods would be shipped by next Tuesday, meaning arrival to patient by next Friday.  I then called the [facility] Clinical Manager, Cristal Deer, and she has pods that this patient can use in the meantime, so I will pick those up this afternoon and meet with the patient to get them to her.   I also called the patient to inform her of these conversations and she is available to meet with me this afternoon.   Also, the patient is using 140 units a day so needs a new pod every 36 hours which is 20 pods a month. I was able to confirm that the current Rx is increased to the 20 pods per month.

## 2018-04-07 DIAGNOSIS — O24424 Gestational diabetes mellitus in childbirth, insulin controlled: Secondary | ICD-10-CM | POA: Diagnosis not present

## 2018-04-09 NOTE — Progress Notes (Signed)
   PRENATAL VISIT NOTE  Subjective:  Dominique Mora is a 38 y.o. [phone] at 7280w1d being seen today for ongoing prenatal care.  She is currently monitored for the following issues for this high-risk pregnancy and has Asthma; PCO (polycystic ovaries); Hypothyroid; Poor dentition; GAD (generalized anxiety disorder); Tobacco abuse; DM (diabetes mellitus) (HCC); Supervision of high risk pregnancy, antepartum; History of preterm delivery; H/O cesarean section; Pre-existing type 2 diabetes mellitus with hyperglycemia during pregnancy in second trimester (HCC); Spinal muscular atrophy carrier (has one copy); and Obesity in pregnancy on their problem list.  Patient reports nausea.  Contractions: Not present. Vag. Bleeding: None.  Movement: Present. Denies leaking of fluid.   The following portions of the patient's history were reviewed and updated as appropriate: allergies, current medications, past family history, past medical history, past social history, past surgical history and problem list. Problem list updated.  Objective:   Vitals:   04/10/18 0936  BP: 120/77  Pulse: 88  Weight: 213 lb 6.4 oz (96.8 kg)    Fetal Status: Fetal Heart Rate (bpm): 141   Movement: Present     General:  Alert, oriented and cooperative. Patient is in no acute distress.  Skin: Skin is warm and dry. No rash noted.   Cardiovascular: Normal heart rate noted  Respiratory: Normal respiratory effort, no problems with respiration noted  Abdomen: Soft, gravid, appropriate for gestational age.  Pain/Pressure: Absent     Pelvic: Cervical exam deferred        Extremities: Normal range of motion.  Edema: Trace  Mental Status: Normal mood and affect. Normal behavior. Normal judgment and thought content.   CBG's mostly in range since getting Omnipod refills.  Assessment and Plan:  Pregnancy: [id number] at 5444w0d  1. Type 2 diabetes mellitus without complication, unspecified whether long term insulin use (HCC) - See Bev  today about running out of pods quickly   2. Pre-existing type 2 diabetes mellitus with hyperglycemia during pregnancy in second trimester (HCC) - Growth US 2/6 - A1C NV  3. Supervision of high risk pregnancy, antepartum   4. History of preterm delivery - 17-P  Preterm labor symptoms and general obstetric precautions including but not limited to vaginal bleeding, contractions, leaking of fluid and fetal movement were reviewed in detail with the patient. Please refer to After Visit Summary for other counseling recommendations.  F/U 2 weeks.  Future Appointments  Date Time Provider Department Center  04/10/2018 11:00 AM WOC-EDUCATION WOC-WOCA WOC  04/17/2018  9:30 AM WH-MFC [place] 1 WH-MFCUS MFC-US    Dorathy KinsmanVirginia Ashrita Chrismer, [place]

## 2018-04-10 ENCOUNTER — Ambulatory Visit: Payer: Medicaid Other | Admitting: *Deleted

## 2018-04-10 ENCOUNTER — Ambulatory Visit (INDEPENDENT_AMBULATORY_CARE_PROVIDER_SITE_OTHER): Payer: Medicaid Other | Admitting: Advanced Practice Midwife

## 2018-04-10 ENCOUNTER — Encounter: Payer: Medicaid Other | Attending: Obstetrics and Gynecology | Admitting: *Deleted

## 2018-04-10 VITALS — BP 120/77 | HR 88 | Wt 213.4 lb

## 2018-04-10 DIAGNOSIS — Z3A00 Weeks of gestation of pregnancy not specified: Secondary | ICD-10-CM | POA: Insufficient documentation

## 2018-04-10 DIAGNOSIS — E1165 Type 2 diabetes mellitus with hyperglycemia: Secondary | ICD-10-CM

## 2018-04-10 DIAGNOSIS — O24112 Pre-existing diabetes mellitus, type 2, in pregnancy, second trimester: Secondary | ICD-10-CM

## 2018-04-10 DIAGNOSIS — O0990 Supervision of high risk pregnancy, unspecified, unspecified trimester: Secondary | ICD-10-CM

## 2018-04-10 DIAGNOSIS — E119 Type 2 diabetes mellitus without complications: Secondary | ICD-10-CM

## 2018-04-10 DIAGNOSIS — O09212 Supervision of pregnancy with history of pre-term labor, second trimester: Secondary | ICD-10-CM

## 2018-04-10 DIAGNOSIS — Z98891 History of uterine scar from previous surgery: Secondary | ICD-10-CM

## 2018-04-10 DIAGNOSIS — O0992 Supervision of high risk pregnancy, unspecified, second trimester: Secondary | ICD-10-CM

## 2018-04-10 DIAGNOSIS — Z148 Genetic carrier of other disease: Secondary | ICD-10-CM

## 2018-04-10 DIAGNOSIS — O24111 Pre-existing diabetes mellitus, type 2, in pregnancy, first trimester: Secondary | ICD-10-CM | POA: Insufficient documentation

## 2018-04-10 DIAGNOSIS — Z8751 Personal history of pre-term labor: Secondary | ICD-10-CM

## 2018-04-10 DIAGNOSIS — E282 Polycystic ovarian syndrome: Secondary | ICD-10-CM

## 2018-04-10 NOTE — Progress Notes (Signed)
Has Paper Log & Pt. States is having a lot of nausea lately.

## 2018-04-10 NOTE — Progress Notes (Signed)
Dominique Mora Re-ordered today 04/10/2018.  [name]

## 2018-04-10 NOTE — Progress Notes (Signed)
04/10/2018  Fasting BG within target ranges 90% of the time with occasional readings in low 100's.  Current basal rate is 2.2 units/hour for a 24 hour total of 52.8 units. Post meal BG for lunch and supper are typically within target range also. Patient occasionally will give meal-time insulin with syringe to save insulin in the Pod, and those doses are not always before the meal so any excursions of blood sugar are typically related to missed or delayed meal time insulin dose.   Basal rate: 2.2 units/hour Meal time insulin : 30 units/meal and 10-20 units for snack or smaller meal  Her average insulin use / day continues to be 90 - 100 units when she gives one meal injection of 30 units with a syringe. Otherwise, daily dose is 130-140 units / day  Per my last conversation with [employer], her next order of pods should be arriving tomorrow (Friday, 04/11/2018)  I have had a conversation with Dorathy Kinsman, CNM regarding the potential use of a concentrated insulin in the pod. Medicaid lists coverage for: Humulin R U 500 vial as a Preferred drug  and Humalog U-200 KwikPen as a Non-preferred drug  I will discuss with Dorathy Kinsman to see if we might consider using in place of Humalog so the pods can last longer and be changed less often.   Plan: Continue basal rate of 2.2 units/hour x 24 hours  Continue  meal time insulin @ 30 units for all meals, that is working well for you currently If snacking, give 10 units insulin for lower carb intake.  Patient to notify me if any hypoglycemia or BG's above 200 mg/dl.  Plan follow up in 2 weeks (February 13th)  in [facility], where she will already have an appointment for weekly injection.

## 2018-04-10 NOTE — Patient Instructions (Addendum)
Vasectomy  (The Basics)  What is a vasectomy?-A vasectomy is a procedure that a man can choose as a type of long-term birth control. After a vasectomy, a man cannot get a woman pregnant. How does a vasectomy prevent pregnancy?-A vasectomy prevents pregnancy by blocking the path the sperm takes to leave the body (figure 1). Sperm are made in the testicles. The testicles are found inside a skin sac called the "scrotum." Sperm are stored in the epididymis, which is a small organ that sits on top of the testicles. During ejaculation, sperm travel from the epididymis through tubes and out the end of the penis. During a vasectomy, a doctor cuts and blocks a tube called the "vas deferens." This prevents sperm from leaving the body. After a vasectomy, a man can still ejaculate fluid, called semen. But the semen does not have any sperm in it. Why might I choose to have a vasectomy?-You might choose to have a vasectomy if you do not want any more children, and do not want to use birth control each time you have sex. Let your doctor know if you have any questions or worries about having a vasectomy. He or she can talk with you and tell you about the procedure. Some men choose to have a sample of their sperm saved before they have a vasectomy. If you want to have a sample of your sperm saved, talk with your doctor. What happens during a vasectomy?-A vasectomy is done in a doctor's office and takes about 30 minutes. During the procedure, a doctor numbs the skin on the scrotum. Then he or she makes a small cut in the skin to reach the vas deferens, cut it, and seal it off. The procedure does not hurt, but some men can feel cramping or pulling. What happens after a vasectomy?-You can go home right after the procedure, but you will need to rest for 2 to 3 days. After a vasectomy, most men have some discomfort and bruising in the scrotum. Your doctor will tell you which pain-relieving medicines to take. He or  she might also prescribe a medicine to treat your pain. Your doctor will give you instructions about what you should and should not do after your vasectomy. He or she will probably tell you to: ?Wear a jock strap to hold the bandage in place ?Not bathe or swim for 1 to 2 days ?Not lift heavy objects or exercise too hard for 7 days ?Wait 7 days to have sex. After that, you must use another form of birth control for a few months to prevent pregnancy. What are the side effects of a vasectomy?-Side effects are uncommon, but can occur. They can include: ?Severe pain in the scrotum ?Bleeding in the scrotum ?Infection of the skin around the cut If you have any side effects, let your doctor know. Some side effects go away over time, but others might need treatment. How long does it take for a vasectomy to work?-It takes a few months for a vasectomy to work. That's because the tubes can still have sperm in them. A man needs to ejaculate 20 or more times after a vasectomy to clear out all the sperm from the tubes. Because of this, a couple should use another type of birth control for a few months to prevent pregnancy. Will I have a follow-up test?-Yes. You will have a follow-up test called a "sperm count" to make sure that your semen does not have any sperm in it. Most men have  this done 3 months after their vasectomy. A sperm count checks how many sperm are in a sample of semen. For this test, a man needs to provide a sample of his semen. If your sample has no sperm, you can stop using other birth control because you will not be able to get a woman pregnant. But if your sample has sperm in it, you can get a woman pregnant. You should still use birth control until you have another sperm count done. What if I have had a vasectomy and want to father a child?-If you have had a vasectomy and want to father a child, talk with your doctor. A surgery to reconnect the vas deferens and open the sperm's path can  be done. But this surgery does not always work. Does a vasectomy prevent getting a disease from sex?-No. A vasectomy does not prevent you from getting or spreading a disease from sex. To prevent getting or spreading a disease from sex, you should use a type of protection called a condom.    Contraception Choices Contraception, also called birth control, refers to methods or devices that prevent pregnancy. Hormonal methods Contraceptive implant  A contraceptive implant is a thin, plastic tube that contains a hormone. It is inserted into the upper part of the arm. It can remain in place for up to 3 years. Progestin-only injections Progestin-only injections are injections of progestin, a synthetic form of the hormone progesterone. They are given every 3 months by a health care provider. Birth control pills  Birth control pills are pills that contain hormones that prevent pregnancy. They must be taken once a day, preferably at the same time each day. Birth control patch  The birth control patch contains hormones that prevent pregnancy. It is placed on the skin and must be changed once a week for three weeks and removed on the fourth week. A prescription is needed to use this method of contraception. Vaginal ring  A vaginal ring contains hormones that prevent pregnancy. It is placed in the vagina for three weeks and removed on the fourth week. After that, the process is repeated with a new ring. A prescription is needed to use this method of contraception. Emergency contraceptive Emergency contraceptives prevent pregnancy after unprotected sex. They come in pill form and can be taken up to 5 days after sex. They work best the sooner they are taken after having sex. Most emergency contraceptives are available without a prescription. This method should not be used as your only form of birth control. Barrier methods Female condom  A female condom is a thin sheath that is worn over the penis during  sex. Condoms keep sperm from going inside a woman's body. They can be used with a spermicide to increase their effectiveness. They should be disposed after a single use. Female condom  A female condom is a soft, loose-fitting sheath that is put into the vagina before sex. The condom keeps sperm from going inside a woman's body. They should be disposed after a single use. Diaphragm  A diaphragm is a soft, dome-shaped barrier. It is inserted into the vagina before sex, along with a spermicide. The diaphragm blocks sperm from entering the uterus, and the spermicide kills sperm. A diaphragm should be left in the vagina for 6-8 hours after sex and removed within 24 hours. A diaphragm is prescribed and fitted by a health care provider. A diaphragm should be replaced every 1-2 years, after giving birth, after gaining more than 15 lb (  6.8 kg), and after pelvic surgery. Cervical cap  A cervical cap is a round, soft latex or plastic cup that fits over the cervix. It is inserted into the vagina before sex, along with spermicide. It blocks sperm from entering the uterus. The cap should be left in place for 6-8 hours after sex and removed within 48 hours. A cervical cap must be prescribed and fitted by a health care provider. It should be replaced every 2 years. Sponge  A sponge is a soft, circular piece of polyurethane foam with spermicide on it. The sponge helps block sperm from entering the uterus, and the spermicide kills sperm. To use it, you make it wet and then insert it into the vagina. It should be inserted before sex, left in for at least 6 hours after sex, and removed and thrown away within 30 hours. Spermicides Spermicides are chemicals that kill or block sperm from entering the cervix and uterus. They can come as a cream, jelly, suppository, foam, or tablet. A spermicide should be inserted into the vagina with an applicator at least 10-15 minutes before sex to allow time for it to work. The process  must be repeated every time you have sex. Spermicides do not require a prescription. Intrauterine contraception Intrauterine device (IUD) An IUD is a T-shaped device that is put in a woman's uterus. There are two types:  Hormone IUD.This type contains progestin, a synthetic form of the hormone progesterone. This type can stay in place for 3-5 years.  Copper IUD.This type is wrapped in copper wire. It can stay in place for 10 years.  Permanent methods of contraception Female tubal ligation In this method, a woman's fallopian tubes are sealed, tied, or blocked during surgery to prevent eggs from traveling to the uterus. Hysteroscopic sterilization In this method, a small, flexible insert is placed into each fallopian tube. The inserts cause scar tissue to form in the fallopian tubes and block them, so sperm cannot reach an egg. The procedure takes about 3 months to be effective. Another form of birth control must be used during those 3 months. Female sterilization This is a procedure to tie off the tubes that carry sperm (vasectomy). After the procedure, the man can still ejaculate fluid (semen). Natural planning methods Natural family planning In this method, a couple does not have sex on days when the woman could become pregnant. Calendar method This means keeping track of the length of each menstrual cycle, identifying the days when pregnancy can happen, and not having sex on those days. Ovulation method In this method, a couple avoids sex during ovulation. Symptothermal method This method involves not having sex during ovulation. The woman typically checks for ovulation by watching changes in her temperature and in the consistency of cervical mucus. Post-ovulation method In this method, a couple waits to have sex until after ovulation. Summary  Contraception, also called birth control, means methods or devices that prevent pregnancy.  Hormonal methods of contraception include implants,  injections, pills, patches, vaginal rings, and emergency contraceptives.  Barrier methods of contraception can include female condoms, female condoms, diaphragms, cervical caps, sponges, and spermicides.  There are two types of IUDs (intrauterine devices). An IUD can be put in a woman's uterus to prevent pregnancy for 3-5 years.  Permanent sterilization can be done through a procedure for males, females, or both.  Natural family planning methods involve not having sex on days when the woman could become pregnant. This information is not intended to replace advice  given to you by your health care provider. Make sure you discuss any questions you have with your health care provider. Document Released: 02/26/2005 Document Revised: 02/28/2017 Document Reviewed: 03/31/2016 Elsevier Interactive Patient Education  2019 [employer]

## 2018-04-16 DIAGNOSIS — E119 Type 2 diabetes mellitus without complications: Secondary | ICD-10-CM | POA: Diagnosis not present

## 2018-04-16 DIAGNOSIS — H5213 Myopia, bilateral: Secondary | ICD-10-CM | POA: Diagnosis not present

## 2018-04-17 ENCOUNTER — Telehealth: Payer: Self-pay | Admitting: *Deleted

## 2018-04-17 ENCOUNTER — Other Ambulatory Visit: Payer: Self-pay | Admitting: Obstetrics and Gynecology

## 2018-04-17 ENCOUNTER — Ambulatory Visit (HOSPITAL_COMMUNITY)
Admission: RE | Admit: 2018-04-17 | Discharge: 2018-04-17 | Disposition: A | Discharge status: Home / Self Care | Payer: Medicaid Other | Source: Ambulatory Visit | Attending: Obstetrics & Gynecology | Admitting: Obstetrics & Gynecology

## 2018-04-17 ENCOUNTER — Ambulatory Visit (INDEPENDENT_AMBULATORY_CARE_PROVIDER_SITE_OTHER): Payer: Medicaid Other | Admitting: Emergency Medicine

## 2018-04-17 ENCOUNTER — Encounter (HOSPITAL_COMMUNITY): Payer: Self-pay

## 2018-04-17 VITALS — BP 114/68 | Wt 214.0 lb

## 2018-04-17 DIAGNOSIS — O9989 Other specified diseases and conditions complicating pregnancy, childbirth and the puerperium: Secondary | ICD-10-CM

## 2018-04-17 DIAGNOSIS — Z8751 Personal history of pre-term labor: Secondary | ICD-10-CM

## 2018-04-17 DIAGNOSIS — O09522 Supervision of elderly multigravida, second trimester: Secondary | ICD-10-CM | POA: Diagnosis not present

## 2018-04-17 DIAGNOSIS — E039 Hypothyroidism, unspecified: Secondary | ICD-10-CM

## 2018-04-17 DIAGNOSIS — J45909 Unspecified asthma, uncomplicated: Secondary | ICD-10-CM | POA: Diagnosis not present

## 2018-04-17 DIAGNOSIS — O99332 Smoking (tobacco) complicating pregnancy, second trimester: Secondary | ICD-10-CM | POA: Diagnosis not present

## 2018-04-17 DIAGNOSIS — O289 Unspecified abnormal findings on antenatal screening of mother: Secondary | ICD-10-CM

## 2018-04-17 DIAGNOSIS — Z148 Genetic carrier of other disease: Secondary | ICD-10-CM | POA: Diagnosis not present

## 2018-04-17 DIAGNOSIS — Z362 Encounter for other antenatal screening follow-up: Secondary | ICD-10-CM | POA: Diagnosis not present

## 2018-04-17 DIAGNOSIS — O24119 Pre-existing diabetes mellitus, type 2, in pregnancy, unspecified trimester: Secondary | ICD-10-CM | POA: Insufficient documentation

## 2018-04-17 DIAGNOSIS — Z3A26 26 weeks gestation of pregnancy: Secondary | ICD-10-CM | POA: Diagnosis not present

## 2018-04-17 DIAGNOSIS — O09212 Supervision of pregnancy with history of pre-term labor, second trimester: Secondary | ICD-10-CM | POA: Diagnosis not present

## 2018-04-17 DIAGNOSIS — O99282 Endocrine, nutritional and metabolic diseases complicating pregnancy, second trimester: Secondary | ICD-10-CM

## 2018-04-17 DIAGNOSIS — O34219 Maternal care for unspecified type scar from previous cesarean delivery: Secondary | ICD-10-CM | POA: Diagnosis not present

## 2018-04-17 MED ORDER — INSULIN LISPRO 200 UNIT/ML ~~LOC~~ SOPN: 50.0000 [IU] | PEN_INJECTOR | Freq: Every day | SUBCUTANEOUS | 12 refills | Status: DC

## 2018-04-17 NOTE — Progress Notes (Signed)
Dominique Mora here for 17-P  Injection.  Injection administered without complication. Patient will return in one week for next injection.  Nena Alexander, RN 04/17/2018  11:59 AM

## 2018-04-17 NOTE — ED Notes (Signed)
Pt reports slipping and falling this morning, landed on her right side.  +Fetal movement, denies contractions or bleeding.

## 2018-04-17 NOTE — Telephone Encounter (Signed)
I saw patient as she was leaving the department for a medical appointment today to let her know we were faxing the Prior Approval Form to [facility] to increase the number of pods per month based on her daily total dose of insulin increasing to 135 units/day.   She confirmed that the additional pods ordered last month were received this past Friday as was promised last week.   I spoke with Dr. Catalina Antigua about the option of using a concentrated insulin in the pod so they could be worn the full 3 days.   Humalog U-200 Kwik pens are listed under Non-preferred Drug list and would be a best choice since the action time is rapid acting as the Humalog U-100 she is taking now  Humulin R U-500 vial is listed under Preferred Drug List but the R works more slowly so is not as ideal a choice.   I called the patient's pharmacy and spoke to [place], pharmacist and she said that by sending in a prescription, she can run it to determine coverage. It may require prior authorization but since the patient is already using Humalog U-100 and her dose is increasing, that may not be necessary. I provided her with my contact information so she can let me know how this progresses.   If the Humalog U-200 is not covered, we can then consider the Humulin U-500 and determine dosing at that time.  Based on patient's current doses, if she switches to Humalog U-200   Basal Rate will change from 2.2 units/hour to 1.1 units/hour Bolus will change from 30 units / meal to 15 units / meal

## 2018-04-18 ENCOUNTER — Other Ambulatory Visit (HOSPITAL_COMMUNITY): Payer: Self-pay | Admitting: *Deleted

## 2018-04-18 DIAGNOSIS — Z794 Long term (current) use of insulin: Principal | ICD-10-CM

## 2018-04-18 DIAGNOSIS — O24319 Unspecified pre-existing diabetes mellitus in pregnancy, unspecified trimester: Secondary | ICD-10-CM

## 2018-04-21 NOTE — Progress Notes (Signed)
I have reviewed this chart and agree with the RN/CMA assessment and management.    Renald Haithcock C Keonna Raether, MD, FACOG Attending Physician, Faculty Practice [facility]  

## 2018-04-24 ENCOUNTER — Other Ambulatory Visit: Payer: Self-pay | Admitting: Obstetrics and Gynecology

## 2018-04-24 ENCOUNTER — Encounter: Payer: Medicaid Other | Attending: Obstetrics and Gynecology | Admitting: *Deleted

## 2018-04-24 ENCOUNTER — Other Ambulatory Visit: Payer: Medicaid Other

## 2018-04-24 ENCOUNTER — Ambulatory Visit (INDEPENDENT_AMBULATORY_CARE_PROVIDER_SITE_OTHER): Payer: Medicaid Other | Admitting: Obstetrics & Gynecology

## 2018-04-24 ENCOUNTER — Ambulatory Visit: Payer: Medicaid Other | Admitting: *Deleted

## 2018-04-24 VITALS — BP 117/72 | HR 86 | Wt 215.4 lb

## 2018-04-24 DIAGNOSIS — O0992 Supervision of high risk pregnancy, unspecified, second trimester: Secondary | ICD-10-CM

## 2018-04-24 DIAGNOSIS — Z23 Encounter for immunization: Secondary | ICD-10-CM | POA: Diagnosis not present

## 2018-04-24 DIAGNOSIS — O0990 Supervision of high risk pregnancy, unspecified, unspecified trimester: Secondary | ICD-10-CM | POA: Diagnosis not present

## 2018-04-24 DIAGNOSIS — E119 Type 2 diabetes mellitus without complications: Secondary | ICD-10-CM

## 2018-04-24 DIAGNOSIS — Z3A00 Weeks of gestation of pregnancy not specified: Secondary | ICD-10-CM | POA: Diagnosis not present

## 2018-04-24 DIAGNOSIS — E1165 Type 2 diabetes mellitus with hyperglycemia: Secondary | ICD-10-CM | POA: Diagnosis not present

## 2018-04-24 DIAGNOSIS — O24111 Pre-existing diabetes mellitus, type 2, in pregnancy, first trimester: Secondary | ICD-10-CM | POA: Insufficient documentation

## 2018-04-24 DIAGNOSIS — O09212 Supervision of pregnancy with history of pre-term labor, second trimester: Secondary | ICD-10-CM

## 2018-04-24 DIAGNOSIS — O24112 Pre-existing diabetes mellitus, type 2, in pregnancy, second trimester: Secondary | ICD-10-CM

## 2018-04-24 LAB — POCT URINALYSIS DIP (DEVICE)
BILIRUBIN URINE: NEGATIVE
Glucose, UA: NEGATIVE mg/dL
Hgb urine dipstick: NEGATIVE
Ketones, ur: 15 mg/dL — AB
Leukocytes,Ua: NEGATIVE
Nitrite: NEGATIVE
PH: 5.5 (ref 5.0–8.0)
Protein, ur: NEGATIVE mg/dL
Specific Gravity, Urine: >=1.03 (ref 1.005–1.030)
Urobilinogen, UA: 0.2 mg/dL (ref 0.0–1.0)

## 2018-04-24 MED ORDER — "INSULIN SYRINGE-NEEDLE U-100 30G X 1/2"" 1 ML MISC": 3.0000 | Freq: Two times a day (BID) | 5 refills | Status: DC

## 2018-04-24 MED ORDER — INSULIN NPH (HUMAN) (ISOPHANE) 100 UNIT/ML ~~LOC~~ SUSP: 25.0000 [IU] | Freq: Every day | SUBCUTANEOUS | 11 refills | Status: DC

## 2018-04-24 NOTE — Progress Notes (Signed)
04/24/2018  Patient here for follow up visit. She continues to use Omnipod for insulin delivery device. Insulin requirements are increasing.   FBG: 92-114 with 1/6 reading in target range Post breakfast: 113-136 with 5/6 readings in target range Post lunch: 105-136 with 4/5 in target Post dinner: 119-166 with 1/7 in target range  Patient needs more overnight insulin to address rising FBG and her post dinner is too high so needs more meal time insulin at dinner.  I reviewed with Dr. Nettie Elm as well as discussing the concept of supplementing her insulin from Omnipod with NPH overnight to save some insulin in the pod so it could last longer.   He agreed to the following: Decrease basal rate in the pod to 0.05 u/hr from 9:30 PM to 8 AM  Patient to give 25 units NPH at 9:30 PM to replace the basal insulin from pod Continue current basal rate from 8 AM to 9:30 PM @ 2.2 u/hr  Patient to take 30 units at breakfast and lunch, increase to 35 units at dinner meal.   Patient aware of this plan and settings adjusted before she left this visit.   Plan: Continue basal rate of 2.2 units/hour from 8 AM to 9:30 PM Take 25 units NPH with syringe at 9:30 PM   Continue  meal time insulin @ 30 units for breakfast and lunch, and 35 units for dinner meal If snacking, give 10 - 15 units insulin for lower carb intake.  Patient to notify me if any hypoglycemia or BG's above 200 mg/dl.  [facility] where she will already have an appointment for weekly injection.

## 2018-04-24 NOTE — Progress Notes (Signed)
.     PRENATAL VISIT NOTE  Subjective:  Dominique Mora is a 38 y.o. G3P0111 at [redacted]w[redacted]d being seen today for ongoing prenatal care.  She is currently monitored for the following issues for this high-risk pregnancy and has Asthma; PCO (polycystic ovaries); Hypothyroid; Poor dentition; GAD (generalized anxiety disorder); Tobacco abuse; DM (diabetes mellitus) (HCC); Supervision of high risk pregnancy, antepartum; History of preterm delivery; H/O cesarean section; Pre-existing type 2 diabetes mellitus with hyperglycemia during pregnancy in second trimester (HCC); Spinal muscular atrophy carrier (has one copy); and Obesity in pregnancy on their problem list.  Patient reports no complaints.  Contractions: Not present. Vag. Bleeding: None.  Movement: Present. Denies leaking of fluid.   The following portions of the patient's history were reviewed and updated as appropriate: allergies, current medications, past family history, past medical history, past social history, past surgical history and problem list. Problem list updated.  Objective:   Vitals:   04/24/18 0832  BP: 117/72  Pulse: 86  Weight: 215 lb 6.4 oz (97.7 kg)    Fetal Status: Fetal Heart Rate (bpm): 145   Movement: Present     General:  Alert, oriented and cooperative. Patient is in no acute distress.  Skin: Skin is warm and dry. No rash noted.   Cardiovascular: Normal heart rate noted  Respiratory: Normal respiratory effort, no problems with respiration noted  Abdomen: Soft, gravid, appropriate for gestational age.  Pain/Pressure: Absent     Pelvic: Cervical exam deferred        Extremities: Normal range of motion.  Edema: None  Mental Status: Normal mood and affect. Normal behavior. Normal judgment and thought content.   Assessment and Plan:  Pregnancy: S3P5945 at [redacted]w[redacted]d  1. Supervision of high risk pregnancy, antepartum Routine 27 weeks - RPR - HIV Antibody (routine testing w rflx) - CBC  2. Type 2 diabetes mellitus  without complication, unspecified whether long term insulin use (HCC) 66% within range, needs more concentrated insulin for the pump  Preterm labor symptoms and general obstetric precautions including but not limited to vaginal bleeding, contractions, leaking of fluid and fetal movement were reviewed in detail with the patient. Please refer to After Visit Summary for other counseling recommendations.  Return in about 2 weeks (around 05/08/2018).  Future Appointments  Date Time Provider Department Center  04/24/2018  8:50 AM WOC-WOCA LAB [facility]-W[facility]A WOC  04/24/2018 11:00 AM WOC-EDUCATION WOC-WOCA WOC  05/08/2018  1:30 PM WH-MFC [place] 1 WH-MFCUS MFC-US    Scheryl Darter, MD

## 2018-04-24 NOTE — Patient Instructions (Signed)

## 2018-04-25 LAB — CBC
Hematocrit: 36 % (ref 34.0–46.6)
Hemoglobin: 11.9 g/dL (ref 11.1–15.9)
MCH: 29.9 pg (ref 26.6–33.0)
MCHC: 33.1 g/dL (ref 31.5–35.7)
MCV: 91 fL (ref 79–97)
Platelets: 410 10*3/uL (ref 150–450)
RBC: 3.98 x10E6/uL (ref 3.77–5.28)
RDW: 12.8 % (ref 11.7–15.4)
WBC: 12.9 10*3/uL — ABNORMAL HIGH (ref 3.4–10.8)

## 2018-04-25 LAB — HIV ANTIBODY (ROUTINE TESTING W REFLEX): HIV Screen 4th Generation wRfx: NONREACTIVE

## 2018-04-25 LAB — RPR: RPR Ser Ql: NONREACTIVE

## 2018-05-01 ENCOUNTER — Ambulatory Visit: Payer: Self-pay

## 2018-05-02 ENCOUNTER — Ambulatory Visit: Payer: Self-pay

## 2018-05-08 ENCOUNTER — Encounter (HOSPITAL_COMMUNITY): Payer: Self-pay

## 2018-05-08 ENCOUNTER — Other Ambulatory Visit: Payer: Self-pay

## 2018-05-08 ENCOUNTER — Ambulatory Visit (INDEPENDENT_AMBULATORY_CARE_PROVIDER_SITE_OTHER): Payer: Medicaid Other | Admitting: *Deleted

## 2018-05-08 ENCOUNTER — Ambulatory Visit (HOSPITAL_COMMUNITY)
Admission: RE | Admit: 2018-05-08 | Discharge: 2018-05-08 | Disposition: A | Discharge status: Home / Self Care | Payer: Medicaid Other | Source: Ambulatory Visit | Attending: Obstetrics & Gynecology | Admitting: Obstetrics & Gynecology

## 2018-05-08 ENCOUNTER — Ambulatory Visit (HOSPITAL_COMMUNITY): Payer: Medicaid Other | Admitting: *Deleted

## 2018-05-08 VITALS — BP 127/70 | HR 93

## 2018-05-08 DIAGNOSIS — O0990 Supervision of high risk pregnancy, unspecified, unspecified trimester: Secondary | ICD-10-CM | POA: Insufficient documentation

## 2018-05-08 DIAGNOSIS — E039 Hypothyroidism, unspecified: Secondary | ICD-10-CM

## 2018-05-08 DIAGNOSIS — O09213 Supervision of pregnancy with history of pre-term labor, third trimester: Secondary | ICD-10-CM | POA: Diagnosis not present

## 2018-05-08 DIAGNOSIS — O99283 Endocrine, nutritional and metabolic diseases complicating pregnancy, third trimester: Secondary | ICD-10-CM

## 2018-05-08 DIAGNOSIS — J45909 Unspecified asthma, uncomplicated: Secondary | ICD-10-CM | POA: Diagnosis not present

## 2018-05-08 DIAGNOSIS — O289 Unspecified abnormal findings on antenatal screening of mother: Secondary | ICD-10-CM

## 2018-05-08 DIAGNOSIS — Z794 Long term (current) use of insulin: Secondary | ICD-10-CM | POA: Diagnosis present

## 2018-05-08 DIAGNOSIS — O99210 Obesity complicating pregnancy, unspecified trimester: Secondary | ICD-10-CM | POA: Insufficient documentation

## 2018-05-08 DIAGNOSIS — O09523 Supervision of elderly multigravida, third trimester: Secondary | ICD-10-CM

## 2018-05-08 DIAGNOSIS — Z3A29 29 weeks gestation of pregnancy: Secondary | ICD-10-CM | POA: Diagnosis not present

## 2018-05-08 DIAGNOSIS — O34219 Maternal care for unspecified type scar from previous cesarean delivery: Secondary | ICD-10-CM | POA: Diagnosis not present

## 2018-05-08 DIAGNOSIS — O24112 Pre-existing diabetes mellitus, type 2, in pregnancy, second trimester: Principal | ICD-10-CM

## 2018-05-08 DIAGNOSIS — O09899 Supervision of other high risk pregnancies, unspecified trimester: Secondary | ICD-10-CM

## 2018-05-08 DIAGNOSIS — O9989 Other specified diseases and conditions complicating pregnancy, childbirth and the puerperium: Secondary | ICD-10-CM | POA: Diagnosis not present

## 2018-05-08 DIAGNOSIS — E1165 Type 2 diabetes mellitus with hyperglycemia: Secondary | ICD-10-CM

## 2018-05-08 DIAGNOSIS — Z148 Genetic carrier of other disease: Secondary | ICD-10-CM

## 2018-05-08 DIAGNOSIS — Z362 Encounter for other antenatal screening follow-up: Secondary | ICD-10-CM

## 2018-05-08 DIAGNOSIS — O09219 Supervision of pregnancy with history of pre-term labor, unspecified trimester: Secondary | ICD-10-CM

## 2018-05-08 DIAGNOSIS — O24319 Unspecified pre-existing diabetes mellitus in pregnancy, unspecified trimester: Secondary | ICD-10-CM | POA: Diagnosis present

## 2018-05-08 DIAGNOSIS — O99333 Smoking (tobacco) complicating pregnancy, third trimester: Secondary | ICD-10-CM | POA: Diagnosis not present

## 2018-05-08 DIAGNOSIS — O24113 Pre-existing diabetes mellitus, type 2, in pregnancy, third trimester: Secondary | ICD-10-CM

## 2018-05-08 NOTE — Progress Notes (Signed)
Dominique Mora here for 17-P  Injection.  Injection administered without complication. Patient will return in one week for next injection.  Jarin Cornfield,RN 05/08/2018  10:14 AM

## 2018-05-09 ENCOUNTER — Other Ambulatory Visit (HOSPITAL_COMMUNITY): Payer: Self-pay | Admitting: *Deleted

## 2018-05-09 DIAGNOSIS — IMO0001 Reserved for inherently not codable concepts without codable children: Secondary | ICD-10-CM

## 2018-05-09 DIAGNOSIS — O24313 Unspecified pre-existing diabetes mellitus in pregnancy, third trimester: Principal | ICD-10-CM

## 2018-05-09 DIAGNOSIS — Z794 Long term (current) use of insulin: Principal | ICD-10-CM

## 2018-05-09 NOTE — Progress Notes (Signed)
I have reviewed this chart and agree with the RN/CMA assessment and management.    Cartha Rotert C Tonye Tancredi, MD, FACOG Attending Physician, [facility]   

## 2018-05-12 ENCOUNTER — Encounter: Payer: Self-pay | Admitting: Family Medicine

## 2018-05-12 DIAGNOSIS — O24424 Gestational diabetes mellitus in childbirth, insulin controlled: Secondary | ICD-10-CM | POA: Diagnosis not present

## 2018-05-13 ENCOUNTER — Ambulatory Visit (INDEPENDENT_AMBULATORY_CARE_PROVIDER_SITE_OTHER): Payer: Medicaid Other | Admitting: Family Medicine

## 2018-05-13 VITALS — BP 125/77 | HR 91 | Wt 217.7 lb

## 2018-05-13 DIAGNOSIS — Z3A29 29 weeks gestation of pregnancy: Secondary | ICD-10-CM | POA: Diagnosis not present

## 2018-05-13 DIAGNOSIS — O09213 Supervision of pregnancy with history of pre-term labor, third trimester: Secondary | ICD-10-CM | POA: Diagnosis not present

## 2018-05-13 DIAGNOSIS — R030 Elevated blood-pressure reading, without diagnosis of hypertension: Secondary | ICD-10-CM

## 2018-05-13 DIAGNOSIS — O0990 Supervision of high risk pregnancy, unspecified, unspecified trimester: Secondary | ICD-10-CM

## 2018-05-13 DIAGNOSIS — O24113 Pre-existing diabetes mellitus, type 2, in pregnancy, third trimester: Secondary | ICD-10-CM

## 2018-05-13 DIAGNOSIS — O24112 Pre-existing diabetes mellitus, type 2, in pregnancy, second trimester: Secondary | ICD-10-CM

## 2018-05-13 DIAGNOSIS — E1165 Type 2 diabetes mellitus with hyperglycemia: Secondary | ICD-10-CM

## 2018-05-13 NOTE — Progress Notes (Signed)
Pt has paper logs

## 2018-05-13 NOTE — Progress Notes (Signed)
PRENATAL VISIT NOTE  Subjective:  Dominique Mora is a 38 y.o. [phone] at [redacted]w[redacted]d being seen today for ongoing prenatal care.  She is currently monitored for the following issues for this high-risk pregnancy and has Asthma; PCO (polycystic ovaries); Hypothyroid; Poor dentition; GAD (generalized anxiety disorder); Tobacco abuse; DM (diabetes mellitus) (HCC); Supervision of high risk pregnancy, antepartum; History of preterm delivery; H/O cesarean section; Pre-existing type 2 diabetes mellitus with hyperglycemia during pregnancy in second trimester (HCC); Spinal muscular atrophy carrier (has one copy); and Obesity in pregnancy on their problem list.  Patient reports pelvic pain on both sides. Worse with rolling over or standing up.    Contractions: Not present. Vag. Bleeding: None.  Movement: Present. Denies leaking of fluid.   Insulin regimen: basal 2.2 per hour, bolus 30 units 2-4 times per day. Had to take 60 unit bolus once because ate waffles. Feels like she's struggling with food choices. Eats a lot of chocolate and doesn't want to eat a separate diet from the rest of the family. Feels very tired.   The following portions of the patient's history were reviewed and updated as appropriate: allergies, current medications, past family history, past medical history, past social history, past surgical history and problem list. Problem list updated.  Objective:   Vitals:   05/13/18 1518 05/13/18 1520  BP: (!) 142/82 125/77  Pulse: 88 91  Weight: 217 lb 11.2 oz (98.7 kg)     Fetal Status: Fetal Heart Rate (bpm): 148   Movement: Present     General:  Alert, oriented and cooperative. Patient is in no acute distress.  Skin: Skin is warm and dry. No rash noted.   Cardiovascular: Normal heart rate noted  Respiratory: Normal respiratory effort, no problems with respiration noted  Abdomen: Soft, gravid, appropriate for gestational age.  Pain/Pressure: Present     Pelvic: Cervical exam deferred         Extremities: Normal range of motion.  Edema: None  Mental Status: Normal mood and affect. Normal behavior. Normal judgment and thought content.   Assessment and Plan:  Pregnancy: [id number] at [redacted]w[redacted]d  1. Supervision of high risk pregnancy, antepartum - overall doing well - reassurance provided for pain and return precautions   2. Pre-existing type 2 diabetes mellitus with hyperglycemia during pregnancy in second trimester [facility]) - patient does not bring log to visits since diabetic educator helping with management - does not feel she can make any additional lifestyle modifications - encourage pt to continue to follow up with DM educator and close follow up with [place]  3. Elevated blood pressure reading without diagnosis of hypertension - repeat BP WNL - will need to monitor closely given pt high risk for PIH    Preterm labor symptoms and general obstetric precautions including but not limited to vaginal bleeding, contractions, leaking of fluid and fetal movement were reviewed in detail with the patient. Please refer to After Visit Summary for other counseling recommendations.  No follow-ups on file.  Future Appointments  Date Time Provider Department Center  05/15/2018 11:00 AM WOC-EDUCATION WOC-WOCA WOC  05/20/2018  2:00 PM WOC-WOCA NURSE WOC-WOCA WOC  05/26/2018  9:15 AM Anyanwu, Jethro Bastos, MD WOC-WOCA WOC  05/29/2018 12:45 PM WH-MFC NURSE WH-MFC MFC-US  05/29/2018  1:00 PM WH-MFC [place] 3 WH-MFCUS MFC-US  06/02/2018 10:20 AM WOC-WOCA NURSE WOC-WOCA WOC  06/05/2018 12:30 PM WH-MFC NURSE WH-MFC MFC-US  06/05/2018 12:45 PM WH-MFC [place] 2 WH-MFCUS MFC-US  06/09/2018  9:15 AM Conan Bowens, MD  WOC-WOCA WOC  06/12/2018 12:30 PM WH-MFC NURSE WH-MFC MFC-US  06/12/2018 12:45 PM WH-MFC [place] 2 WH-MFCUS MFC-US  06/16/2018 10:20 AM WOC-WOCA NURSE WOC-WOCA WOC  06/23/2018  9:15 AM LaFayette Bing, MD WOC-WOCA WOC  06/30/2018  9:15 AM Levie Heritage, DO WOC-WOCA WOC  07/07/2018  9:15 AM Brashear Bing, MD [facility]  WOC    Gwenevere Abbot, MD

## 2018-05-13 NOTE — Progress Notes (Signed)
Re-ordered Makena for Pt, called [facility] @ [phone], states will be mailed tomorrow, regular mail.Signed Receipt.

## 2018-05-15 ENCOUNTER — Encounter: Payer: Medicaid Other | Attending: Obstetrics and Gynecology | Admitting: *Deleted

## 2018-05-15 ENCOUNTER — Ambulatory Visit: Payer: Medicaid Other | Admitting: *Deleted

## 2018-05-15 DIAGNOSIS — E1165 Type 2 diabetes mellitus with hyperglycemia: Secondary | ICD-10-CM | POA: Diagnosis not present

## 2018-05-15 DIAGNOSIS — Z3A00 Weeks of gestation of pregnancy not specified: Secondary | ICD-10-CM | POA: Diagnosis not present

## 2018-05-15 DIAGNOSIS — O24111 Pre-existing diabetes mellitus, type 2, in pregnancy, first trimester: Secondary | ICD-10-CM | POA: Diagnosis not present

## 2018-05-15 NOTE — Progress Notes (Signed)
05/15/2018  Patient here for follow up visit. She continues to use Omnipod for insulin delivery device. Insulin requirements are increasing.   FBG: 92-116 with numbers increasing gradually Post breakfast: 110-207 with 2/6 readings in target range Post lunch: 85-139 with 3/4 in target Post dinner: 115-132 with 2/4 in target range  Patient needs more overnight insulin to address rising FBG   Increase basal rate in the pod by 10% from 2.2 u/hr to 2.4 u/hr  Patient to continue to take 30 units at breakfast and lunch, increase to 35 units at dinner meal. I mentioned to her that she is aiming for 3 Carb Choices (45 grams) per meal and at 30 units per meal that is equal to 10 units per Carb Choice. So if she chooses to eat more carb choices, she could use that equation to make a reasonable decision on insulin dose increases. Currently, she states she will take an extra 30 units for a larger meal, and I feel this would be an more appropriate way to make that decision.   Patient aware of this plan and Basal Rate settings adjusted before she left.   Patient to notify me if any hypoglycemia or BG's above 200 mg/dl.  Plan follow up in 4 weeks.

## 2018-05-20 ENCOUNTER — Encounter: Payer: Self-pay | Admitting: Emergency Medicine

## 2018-05-20 ENCOUNTER — Ambulatory Visit (INDEPENDENT_AMBULATORY_CARE_PROVIDER_SITE_OTHER): Payer: Medicaid Other | Admitting: Emergency Medicine

## 2018-05-20 VITALS — BP 129/80 | HR 106 | Wt 220.0 lb

## 2018-05-20 DIAGNOSIS — O09213 Supervision of pregnancy with history of pre-term labor, third trimester: Secondary | ICD-10-CM | POA: Diagnosis not present

## 2018-05-20 DIAGNOSIS — Z8751 Personal history of pre-term labor: Secondary | ICD-10-CM

## 2018-05-20 NOTE — Progress Notes (Deleted)
Dominique Mora here for 17-P  Injection.  Injection administered without complication. Patient will return in one week for next injection.  Henrietta Dine, CMA 05/20/2018  2:39 PM

## 2018-05-20 NOTE — Progress Notes (Unsigned)
Dominique Mora arrived yesterday 05/19/2018.  Pam Neal,CMA [facility]

## 2018-05-20 NOTE — Progress Notes (Signed)
Dominique Mora here for 17-P  Injection.  Injection administered without complication. Patient will return in one week for next injection.  Nena Alexander, RN 05/20/2018  2:48 PM

## 2018-05-21 NOTE — Progress Notes (Signed)
Chart reviewed for nurse visit. Agree with plan of care.   Megin Consalvo Lorraine, CNM 05/21/2018 11:02 AM   

## 2018-05-26 ENCOUNTER — Other Ambulatory Visit: Payer: Self-pay

## 2018-05-26 ENCOUNTER — Ambulatory Visit (INDEPENDENT_AMBULATORY_CARE_PROVIDER_SITE_OTHER): Payer: Medicaid Other | Admitting: Obstetrics & Gynecology

## 2018-05-26 ENCOUNTER — Encounter: Payer: Self-pay | Admitting: Obstetrics & Gynecology

## 2018-05-26 VITALS — BP 130/73 | HR 84 | Wt 220.4 lb

## 2018-05-26 DIAGNOSIS — Z8751 Personal history of pre-term labor: Secondary | ICD-10-CM

## 2018-05-26 DIAGNOSIS — O0993 Supervision of high risk pregnancy, unspecified, third trimester: Secondary | ICD-10-CM | POA: Diagnosis not present

## 2018-05-26 DIAGNOSIS — O09213 Supervision of pregnancy with history of pre-term labor, third trimester: Secondary | ICD-10-CM | POA: Diagnosis not present

## 2018-05-26 DIAGNOSIS — O24113 Pre-existing diabetes mellitus, type 2, in pregnancy, third trimester: Secondary | ICD-10-CM

## 2018-05-26 DIAGNOSIS — E1165 Type 2 diabetes mellitus with hyperglycemia: Secondary | ICD-10-CM | POA: Diagnosis not present

## 2018-05-26 DIAGNOSIS — O0990 Supervision of high risk pregnancy, unspecified, unspecified trimester: Secondary | ICD-10-CM

## 2018-05-26 DIAGNOSIS — Z98891 History of uterine scar from previous surgery: Secondary | ICD-10-CM | POA: Diagnosis not present

## 2018-05-26 DIAGNOSIS — Z3A31 31 weeks gestation of pregnancy: Secondary | ICD-10-CM

## 2018-05-26 NOTE — Patient Instructions (Signed)
Return to office for any scheduled appointments. Call the office or go to the MAU at [facility] if:  You begin to have strong, frequent contractions  Your water breaks.  Sometimes it is a big gush of fluid, sometimes it is just a trickle that keeps getting your panties wet or running down your legs  You have vaginal bleeding.  It is normal to have a small amount of spotting if your cervix was checked.   You do not feel your baby moving like normal.  If you do not, get something to eat and drink and lay down and focus on feeling your baby move.   If your baby is still not moving like normal, you should call the office or go to MAU.  Any other obstetric concerns.   

## 2018-05-26 NOTE — Progress Notes (Signed)
PRENATAL VISIT NOTE  Subjective:  Dominique Mora is a 38 y.o. [phone] at [redacted]w[redacted]d being seen today for ongoing prenatal care.  She is currently monitored for the following issues for this high-risk pregnancy and has Asthma; PCO (polycystic ovaries); Hypothyroid; Poor dentition; GAD (generalized anxiety disorder); Tobacco abuse; DM (diabetes mellitus) (HCC); Supervision of high risk pregnancy, antepartum; History of preterm delivery; H/O cesarean section; Pre-existing type 2 diabetes mellitus with hyperglycemia during pregnancy in third trimester (HCC); Spinal muscular atrophy carrier (has one copy); and Obesity in pregnancy on their problem list.  Patient reports no complaints.  Contractions: Not present. Vag. Bleeding: None.  Movement: Present. Denies leaking of fluid.   The following portions of the patient's history were reviewed and updated as appropriate: allergies, current medications, past family history, past medical history, past social history, past surgical history and problem list.   Objective:   Vitals:   05/26/18 1005  BP: 130/73  Pulse: 84  Weight: 220 lb 6.4 oz (100 kg)    Fetal Status: Fetal Heart Rate (bpm): 138   Movement: Present     General:  Alert, oriented and cooperative. Patient is in no acute distress.  Skin: Skin is warm and dry. No rash noted.   Cardiovascular: Normal heart rate noted  Respiratory: Normal respiratory effort, no problems with respiration noted  Abdomen: Soft, gravid, appropriate for gestational age.  Pain/Pressure: Present     Pelvic: Cervical exam deferred        Extremities: Normal range of motion.     Mental Status: Normal mood and affect. Normal behavior. Normal judgment and thought content.   Imaging: [place] Mfm Ob Follow Up  Result Date: 05/08/2018 ----------------------------------------------------------------------  OBSTETRICS REPORT                       (Signed Final 05/08/2018 02:43 pm)  ---------------------------------------------------------------------- Patient Info  ID #:       [id number]                          D.O.B.:  [birth year] (37 yrs)  Name:       Dominique Mora                 Visit Date: 05/08/2018 01:37 pm ---------------------------------------------------------------------- Performed By  Performed By:     Lenise Arena        Ref. Address:     [address]                                                             [place], [place]  [address]  Attending:        Noralee Space MD        [facility]  Referred By:      Adam Phenix                    MD ---------------------------------------------------------------------- Orders   #  Description                          Code         Ordered By   1  [place] MFM OB FOLLOW UP                  [phone]     Noralee Space  ----------------------------------------------------------------------   #  Order #                    Accession #                 Episode #   1  [id number]                  [id number]                  [id number]  ---------------------------------------------------------------------- Indications   Advanced maternal age multigravida 80+,        O6.522   second trimester   Pre-existing diabetes, type 2, in pregnancy,   O24.112   second trimester (insulin/metformin) (Echo   WNL, imaging inadequate to R/O ASD)   Previous cesarean delivery, antepartum         O34.219   Poor obstetric history: Previous preterm       O09.219   delivery, antepartum    Abnormal biochemical screen - (AFP: 2.02)      O28.9   OSBR 1:230   Genetic carrier (SMA)                          Z14.8   Asthma - No inhaler                            O99.89 j45.909   Congenital hypothyroidism without goiter       O99.280  E03.9   (Synthroid)   Tobacco use complicating pregnancy,            O99.332   second trimester 1/4 pk day   Encounter for other antenatal screening        Z36.2   follow-up (Low risk NIPS)   [redacted] weeks gestation of pregnancy                Z3A.29  ---------------------------------------------------------------------- Vital Signs  Weight (lb): 216                               Height:        5'4"  BMI:         37.07 ----------------------------------------------------------------------  Fetal Evaluation  Num Of Fetuses:         1  Fetal Heart Rate(bpm):  150  Cardiac Activity:       Observed  Presentation:           Cephalic  Placenta:               Posterior  P. Cord Insertion:      Previously Visualized  Amniotic Fluid  AFI FV:      Within normal limits  AFI Sum(cm)     %Tile       Largest Pocket(cm)  11.8            27          3.24  RUQ(cm)       RLQ(cm)       LUQ(cm)        LLQ(cm)  2.86          3.24          2.6            3.1 ---------------------------------------------------------------------- Biometry  BPD:      67.4  mm     G. Age:  27w 1d          2  %    CI:        70.46   %    70 - 86                                                          FL/HC:      19.8   %    19.6 - 20.8  HC:       256   mm     G. Age:  27w 6d        < 3  %    HC/AC:      1.06        0.99 - 1.21  AC:      240.8  mm     G. Age:  28w 3d         22  %    FL/BPD:     75.2   %    71 - 87  FL:       50.7  mm     G. Age:  27w 1d        < 3  %    FL/AC:      21.1   %    20 - 24  HUM:      46.2  mm     G. Age:  27w 2d          6  %  LV:        5.3  mm  Est. FW:    1133  gm      2 lb 8 oz     26  % ---------------------------------------------------------------------- OB History  Gravidity:    3         Prem:   1         SAB:   1  Living:       1 ---------------------------------------------------------------------- Gestational Age  U/S Today:     27w 5d  EDD:   08/02/18  Best:          29w 1d     Det.  ByMarcella Dubs         EDD:   07/23/18                                      (12/05/17) ---------------------------------------------------------------------- Anatomy  Cranium:               Appears normal         Aortic Arch:            Previously seen  Cavum:                 Appears normal         Ductal Arch:            Previously seen  Ventricles:            Appears normal         Diaphragm:              Appears normal  Choroid Plexus:        Previously seen        Stomach:                Appears normal, left                                                                        sided  Cerebellum:            Previously seen        Abdomen:                Appears normal  Posterior Fossa:       Previously seen        Abdominal Wall:         Previously seen  Nuchal Fold:           Previously seen        Cord Vessels:           Previously seen  Face:                  Orbits and profile     Kidneys:                Appear normal                         previously seen  Lips:                  Previously seen        Bladder:                Appears normal  Thoracic:              Appears normal         Spine:                  Previously seen  Heart:                 Previously seen  Upper Extremities:      Previously seen  RVOT:                  Previously seen        Lower Extremities:      Previously seen  LVOT:                  Previously seen  Other:  Female gender. Heels and 5th digit previously visualized. Nasal bone          prev visualized. Open hands prev visualized. ---------------------------------------------------------------------- Cervix Uterus Adnexa  Cervix  Not visualized (advanced GA >24wks) ---------------------------------------------------------------------- Impression  Type 1 diabetes and the patient is on continuous  subcutaneous insulin infusion.  Amniotic fluid is normal and good fetal activity is seen. Fetal  growth is appropriate for gestational age. Placenta is  posterior and there is  no evidence of previa or accreta.  Patient had previous cesarean delivery and had questions on  VBAC. I counsled her on the risks and benefits of VBAC. ---------------------------------------------------------------------- Recommendations  -Fetal growth assessment in 3 weeks.  -Weekly BPP from next visit till delivery. ----------------------------------------------------------------------                  Noralee Space, MD Electronically Signed Final Report   05/08/2018 02:43 pm ----------------------------------------------------------------------   Assessment and Plan:  Pregnancy: [id number] at [redacted]w[redacted]d 1. Pre-existing type 2 diabetes mellitus with hyperglycemia during pregnancy in third trimester (HCC) Elevated fasting CBGs.  Patient reports that her values were elevated last week and Ms. Paddock made changes to her Omnipod that "did not stick"; the changes did not occur in the Omnipod. She will call her to make sure that the changes are entered directly, she may have to come in later this week to see her. Will start weekly antenatal testing this week at [facility]; growth scan and BPP scheduled later this week.  2. History of preterm delivery Continue weekly 17P injections  3. H/O cesarean section Counseled regarding TOLAC vs RCS; risks/benefits discussed in detail. All questions answered.  Patient elects for TOLAC if she goes into spontaneous labor, consent signed 05/26/2018. She will want to be scheduled for RCS if no spontaneous labor by 39 weeks  4. Supervision of high risk pregnancy, antepartum Preterm labor symptoms and general obstetric precautions including but not limited to vaginal bleeding, contractions, leaking of fluid and fetal movement were reviewed in detail with the patient. Please refer to After Visit Summary for other counseling recommendations.   Return in about 10 days (around 06/05/2018) for 17P, NST, BPP, OB Visit (HOB).  Future Appointments  Date Time Provider Department Center   05/29/2018 12:45 PM WH-MFC NURSE WH-MFC MFC-US  05/29/2018  1:00 PM WH-MFC [place] 3 WH-MFCUS MFC-US  06/05/2018  9:15 AM WOC-EDUCATION WOC-WOCA WOC  06/05/2018 10:15 AM WOC-WOCA NST WOC-WOCA WOC  06/05/2018 11:15 AM Conan Bowens, MD WOC-WOCA WOC  06/05/2018 12:30 PM WH-MFC NURSE WH-MFC MFC-US  06/05/2018 12:45 PM WH-MFC [place] 2 WH-MFCUS MFC-US  06/12/2018 12:30 PM WH-MFC NURSE WH-MFC MFC-US  06/12/2018 12:45 PM WH-MFC [place] 2 WH-MFCUS MFC-US    Jaynie Collins, MD

## 2018-05-27 ENCOUNTER — Encounter: Payer: Self-pay | Admitting: *Deleted

## 2018-05-28 ENCOUNTER — Other Ambulatory Visit: Payer: Self-pay | Admitting: Obstetrics & Gynecology

## 2018-05-28 DIAGNOSIS — O0990 Supervision of high risk pregnancy, unspecified, unspecified trimester: Secondary | ICD-10-CM

## 2018-05-29 ENCOUNTER — Encounter (HOSPITAL_COMMUNITY): Payer: Self-pay

## 2018-05-29 ENCOUNTER — Encounter: Payer: Self-pay | Admitting: Obstetrics & Gynecology

## 2018-05-29 ENCOUNTER — Other Ambulatory Visit: Payer: Self-pay

## 2018-05-29 ENCOUNTER — Ambulatory Visit (HOSPITAL_COMMUNITY)
Admission: RE | Admit: 2018-05-29 | Discharge: 2018-05-29 | Disposition: A | Discharge status: Home / Self Care | Payer: Medicaid Other | Source: Ambulatory Visit | Attending: Obstetrics & Gynecology | Admitting: Obstetrics & Gynecology

## 2018-05-29 ENCOUNTER — Ambulatory Visit (HOSPITAL_COMMUNITY): Payer: Medicaid Other | Admitting: *Deleted

## 2018-05-29 VITALS — BP 138/85 | HR 110 | Temp 98.8°F | Wt 221.5 lb

## 2018-05-29 DIAGNOSIS — Z794 Long term (current) use of insulin: Secondary | ICD-10-CM | POA: Insufficient documentation

## 2018-05-29 DIAGNOSIS — O24119 Pre-existing diabetes mellitus, type 2, in pregnancy, unspecified trimester: Secondary | ICD-10-CM | POA: Diagnosis not present

## 2018-05-29 DIAGNOSIS — Z3A32 32 weeks gestation of pregnancy: Secondary | ICD-10-CM | POA: Diagnosis not present

## 2018-05-29 DIAGNOSIS — O289 Unspecified abnormal findings on antenatal screening of mother: Secondary | ICD-10-CM | POA: Diagnosis not present

## 2018-05-29 DIAGNOSIS — O9989 Other specified diseases and conditions complicating pregnancy, childbirth and the puerperium: Secondary | ICD-10-CM | POA: Diagnosis not present

## 2018-05-29 DIAGNOSIS — O24313 Unspecified pre-existing diabetes mellitus in pregnancy, third trimester: Secondary | ICD-10-CM | POA: Insufficient documentation

## 2018-05-29 DIAGNOSIS — O24113 Pre-existing diabetes mellitus, type 2, in pregnancy, third trimester: Secondary | ICD-10-CM

## 2018-05-29 DIAGNOSIS — O09213 Supervision of pregnancy with history of pre-term labor, third trimester: Secondary | ICD-10-CM | POA: Diagnosis not present

## 2018-05-29 DIAGNOSIS — O0990 Supervision of high risk pregnancy, unspecified, unspecified trimester: Secondary | ICD-10-CM

## 2018-05-29 DIAGNOSIS — O99210 Obesity complicating pregnancy, unspecified trimester: Secondary | ICD-10-CM | POA: Insufficient documentation

## 2018-05-29 DIAGNOSIS — E039 Hypothyroidism, unspecified: Secondary | ICD-10-CM | POA: Diagnosis not present

## 2018-05-29 DIAGNOSIS — O34219 Maternal care for unspecified type scar from previous cesarean delivery: Secondary | ICD-10-CM | POA: Diagnosis not present

## 2018-05-29 DIAGNOSIS — IMO0001 Reserved for inherently not codable concepts without codable children: Secondary | ICD-10-CM

## 2018-05-29 DIAGNOSIS — O09523 Supervision of elderly multigravida, third trimester: Secondary | ICD-10-CM | POA: Diagnosis not present

## 2018-05-29 DIAGNOSIS — J45909 Unspecified asthma, uncomplicated: Secondary | ICD-10-CM

## 2018-05-29 DIAGNOSIS — Z362 Encounter for other antenatal screening follow-up: Secondary | ICD-10-CM

## 2018-05-29 DIAGNOSIS — O99283 Endocrine, nutritional and metabolic diseases complicating pregnancy, third trimester: Secondary | ICD-10-CM

## 2018-05-29 MED ORDER — LEVOTHYROXINE SODIUM 50 MCG PO TABS: 50.0000 ug | ORAL_TABLET | Freq: Every day | ORAL | 1 refills | Status: DC

## 2018-05-29 NOTE — Telephone Encounter (Signed)
Patient is requesting a refill on Levothyroxine 50 mcg

## 2018-06-02 ENCOUNTER — Other Ambulatory Visit (HOSPITAL_COMMUNITY): Payer: Self-pay | Admitting: *Deleted

## 2018-06-02 ENCOUNTER — Ambulatory Visit: Payer: Self-pay

## 2018-06-02 DIAGNOSIS — O24119 Pre-existing diabetes mellitus, type 2, in pregnancy, unspecified trimester: Secondary | ICD-10-CM

## 2018-06-02 DIAGNOSIS — Z794 Long term (current) use of insulin: Principal | ICD-10-CM

## 2018-06-03 ENCOUNTER — Encounter: Payer: Self-pay | Admitting: *Deleted

## 2018-06-05 ENCOUNTER — Ambulatory Visit (INDEPENDENT_AMBULATORY_CARE_PROVIDER_SITE_OTHER): Payer: Medicaid Other | Admitting: *Deleted

## 2018-06-05 ENCOUNTER — Encounter: Payer: Self-pay | Admitting: Obstetrics and Gynecology

## 2018-06-05 ENCOUNTER — Other Ambulatory Visit (HOSPITAL_COMMUNITY): Payer: Self-pay

## 2018-06-05 ENCOUNTER — Ambulatory Visit: Payer: Self-pay

## 2018-06-05 ENCOUNTER — Other Ambulatory Visit: Payer: Self-pay

## 2018-06-05 ENCOUNTER — Ambulatory Visit: Payer: Medicaid Other | Admitting: *Deleted

## 2018-06-05 ENCOUNTER — Ambulatory Visit (INDEPENDENT_AMBULATORY_CARE_PROVIDER_SITE_OTHER): Payer: Medicaid Other | Admitting: Obstetrics and Gynecology

## 2018-06-05 ENCOUNTER — Ambulatory Visit (HOSPITAL_COMMUNITY): Payer: Self-pay

## 2018-06-05 VITALS — BP 114/72 | HR 80 | Wt 221.2 lb

## 2018-06-05 DIAGNOSIS — O24113 Pre-existing diabetes mellitus, type 2, in pregnancy, third trimester: Secondary | ICD-10-CM

## 2018-06-05 DIAGNOSIS — E1165 Type 2 diabetes mellitus with hyperglycemia: Secondary | ICD-10-CM | POA: Diagnosis not present

## 2018-06-05 DIAGNOSIS — O0993 Supervision of high risk pregnancy, unspecified, third trimester: Secondary | ICD-10-CM

## 2018-06-05 DIAGNOSIS — O09213 Supervision of pregnancy with history of pre-term labor, third trimester: Secondary | ICD-10-CM | POA: Diagnosis not present

## 2018-06-05 DIAGNOSIS — O26893 Other specified pregnancy related conditions, third trimester: Secondary | ICD-10-CM

## 2018-06-05 DIAGNOSIS — O0990 Supervision of high risk pregnancy, unspecified, unspecified trimester: Secondary | ICD-10-CM

## 2018-06-05 DIAGNOSIS — E039 Hypothyroidism, unspecified: Secondary | ICD-10-CM

## 2018-06-05 DIAGNOSIS — Z98891 History of uterine scar from previous surgery: Secondary | ICD-10-CM

## 2018-06-05 DIAGNOSIS — Z148 Genetic carrier of other disease: Secondary | ICD-10-CM

## 2018-06-05 DIAGNOSIS — Z8751 Personal history of pre-term labor: Secondary | ICD-10-CM

## 2018-06-05 DIAGNOSIS — Z3A33 33 weeks gestation of pregnancy: Secondary | ICD-10-CM

## 2018-06-05 LAB — POCT URINALYSIS DIP (DEVICE)
Bilirubin Urine: NEGATIVE
Glucose, UA: NEGATIVE mg/dL
Hgb urine dipstick: NEGATIVE
Ketones, ur: 15 mg/dL — AB
Nitrite: NEGATIVE
Protein, ur: NEGATIVE mg/dL
Specific Gravity, Urine: 1.02 (ref 1.005–1.030)
Urobilinogen, UA: 0.2 mg/dL (ref 0.0–1.0)
pH: 5.5 (ref 5.0–8.0)

## 2018-06-05 NOTE — Progress Notes (Signed)
I have reviewed this chart and agree with the RN/CMA assessment and management.    K. Meryl Broc Caspers, M.D. Attending [facility] (Faculty Practice)   

## 2018-06-05 NOTE — Progress Notes (Signed)
06/05/2018  EDD: 07/23/2018  Patient here for follow up visit. Patient states she is feeling well but tires easily. She states she feels comfortable with her blood sugars overall and continues to make adjustments to meal dose if needed. Her total daily dose of insulin over the past week are typically around 87 units most days with increases for larger meals up to 150 units on occasion.  She has no questions or concerns today.  She continues to use Omnipod for insulin delivery device. Insulin requirements are increasing.   FBG: 97-122  Post breakfast: 96-134  Post lunch: 96-135  Post dinner: 94-143   Patient needs more overnight insulin to address rising FBG   Increase basal rate in the pod by 10% from 2.4 u/hr to 2.7 u/hr  Patient to continue to take 30 units at breakfast,  lunch and dinner. Patient states she is already is increasing her dose for larger meals and correcting if post meal blood sugars are elevated.   Patient aware of this plan and Basal Rate settings adjusted before she left.   Patient to notify me if any hypoglycemia or BG's above 200 mg/dl.  Plan follow up in 4 weeks.

## 2018-06-05 NOTE — Progress Notes (Signed)
   PRENATAL VISIT NOTE  Subjective:  Dominique Mora is a 38 y.o. [phone] at [redacted]w[redacted]d being seen today for ongoing prenatal care.  She is currently monitored for the following issues for this high-risk pregnancy and has Asthma; PCO (polycystic ovaries); Hypothyroid; Poor dentition; GAD (generalized anxiety disorder); Tobacco abuse; DM (diabetes mellitus) (HCC); Supervision of high risk pregnancy, antepartum; History of preterm delivery; H/O cesarean section; Pre-existing type 2 diabetes mellitus with hyperglycemia during pregnancy in third trimester (HCC); Spinal muscular atrophy carrier (has one copy); and Obesity in pregnancy on their problem list.  Patient reports some discharge, patient reports she does not think her water broke.  Contractions: Irregular. Vag. Bleeding: None.  Movement: Present. Denies leaking of fluid.   The following portions of the patient's history were reviewed and updated as appropriate: allergies, current medications, past family history, past medical history, past social history, past surgical history and problem list.   Objective:   Vitals:   06/05/18 1059  BP: 114/72  Pulse: 80  Weight: 221 lb 3.2 oz (100.3 kg)    Fetal Status: Fetal Heart Rate (bpm): NST   Movement: Present     General:  Alert, oriented and cooperative. Patient is in no acute distress.  Skin: Skin is warm and dry. No rash noted.   Cardiovascular: Normal heart rate noted  Respiratory: Normal respiratory effort, no problems with respiration noted  Abdomen: Soft, gravid, appropriate for gestational age.  Pain/Pressure: Present     Pelvic: Cervical exam deferred        Extremities: Normal range of motion.     Mental Status: Normal mood and affect. Normal behavior. Normal judgment and thought content.   Assessment and Plan:  Pregnancy: [id number] at [redacted]w[redacted]d  1. Supervision of high risk pregnancy, antepartum  2. Pre-existing type 2 diabetes mellitus with hyperglycemia during pregnancy in third  trimester (HCC) Has Omnipod, saw [facility] today, increased basal rate, she adjusts as needed for meals  Reports sugars are fairly well controlled FG in high 90s PP: highest is 140-160, mostly 110s-120s NST reactive today BPP 10/10  3. History of preterm delivery Cont 17 P  4. H/O cesarean section For TOLAC  5. Spinal muscular atrophy carrier (has one copy)  6. Hypothyroidism, unspecified type TSH 03/27/08 wnl   Preterm labor symptoms and general obstetric precautions including but not limited to vaginal bleeding, contractions, leaking of fluid and fetal movement were reviewed in detail with the patient. Please refer to After Visit Summary for other counseling recommendations.   Return in about 1 week (around 06/12/2018) for OB visit (MD).  Future Appointments  Date Time Provider Department Center  06/12/2018 10:15 AM WOC-[facility]OCA NST WOC-WOCA WOC  06/12/2018 11:15 AM Levie Heritage, DO WOC-WOCA WOC  06/19/2018 10:15 AM WOC-WOCA NST WOC-WOCA WOC  06/19/2018 11:15 AM Allie Bossier, MD WOC-WOCA WOC  06/25/2018 12:45 PM WH-MFC NURSE WH-MFC MFC-US  06/25/2018 12:45 PM WH-MFC [place] 5 WH-MFCUS MFC-US  06/26/2018 10:15 AM WOC-WOCA NST WOC-WOCA WOC  06/26/2018 11:15 AM Conan Bowens, MD WOC-WOCA WOC  07/03/2018 10:15 AM WOC-WOCA NST WOC-WOCA WOC  07/03/2018 11:15 AM Adam Phenix, MD WOC-WOCA WOC  07/10/2018 10:15 AM WOC-WOCA NST WOC-WOCA WOC  07/10/2018 11:15 AM Adam Phenix, MD WOC-WOCA WOC    Conan Bowens, MD

## 2018-06-05 NOTE — Progress Notes (Signed)

## 2018-06-09 ENCOUNTER — Encounter: Payer: Self-pay | Admitting: Obstetrics and Gynecology

## 2018-06-12 ENCOUNTER — Ambulatory Visit: Payer: Self-pay

## 2018-06-12 ENCOUNTER — Other Ambulatory Visit: Payer: Self-pay

## 2018-06-12 ENCOUNTER — Ambulatory Visit (HOSPITAL_COMMUNITY): Payer: Self-pay

## 2018-06-12 ENCOUNTER — Ambulatory Visit (INDEPENDENT_AMBULATORY_CARE_PROVIDER_SITE_OTHER): Payer: Medicaid Other | Admitting: *Deleted

## 2018-06-12 ENCOUNTER — Ambulatory Visit (INDEPENDENT_AMBULATORY_CARE_PROVIDER_SITE_OTHER): Payer: Medicaid Other | Admitting: Family Medicine

## 2018-06-12 ENCOUNTER — Other Ambulatory Visit (HOSPITAL_COMMUNITY): Payer: Self-pay

## 2018-06-12 VITALS — BP 127/78 | HR 98 | Temp 98.3°F | Wt 220.3 lb

## 2018-06-12 DIAGNOSIS — Z8751 Personal history of pre-term labor: Secondary | ICD-10-CM

## 2018-06-12 DIAGNOSIS — E1165 Type 2 diabetes mellitus with hyperglycemia: Secondary | ICD-10-CM

## 2018-06-12 DIAGNOSIS — O34211 Maternal care for low transverse scar from previous cesarean delivery: Secondary | ICD-10-CM | POA: Diagnosis not present

## 2018-06-12 DIAGNOSIS — Z3A34 34 weeks gestation of pregnancy: Secondary | ICD-10-CM | POA: Diagnosis not present

## 2018-06-12 DIAGNOSIS — O09213 Supervision of pregnancy with history of pre-term labor, third trimester: Secondary | ICD-10-CM

## 2018-06-12 DIAGNOSIS — O0990 Supervision of high risk pregnancy, unspecified, unspecified trimester: Secondary | ICD-10-CM

## 2018-06-12 DIAGNOSIS — O0993 Supervision of high risk pregnancy, unspecified, third trimester: Secondary | ICD-10-CM

## 2018-06-12 DIAGNOSIS — Z148 Genetic carrier of other disease: Secondary | ICD-10-CM

## 2018-06-12 DIAGNOSIS — O24113 Pre-existing diabetes mellitus, type 2, in pregnancy, third trimester: Principal | ICD-10-CM

## 2018-06-12 DIAGNOSIS — E039 Hypothyroidism, unspecified: Secondary | ICD-10-CM | POA: Diagnosis not present

## 2018-06-12 DIAGNOSIS — O99283 Endocrine, nutritional and metabolic diseases complicating pregnancy, third trimester: Secondary | ICD-10-CM

## 2018-06-12 DIAGNOSIS — O24424 Gestational diabetes mellitus in childbirth, insulin controlled: Secondary | ICD-10-CM | POA: Diagnosis not present

## 2018-06-12 DIAGNOSIS — Z98891 History of uterine scar from previous surgery: Secondary | ICD-10-CM

## 2018-06-12 NOTE — Progress Notes (Signed)
Pt reports decreased FM today. Pt felt good FM during NST and BPP.    Pt informed that the ultrasound is considered a limited OB ultrasound and is not intended to be a complete ultrasound exam.  Patient also informed that the ultrasound is not being completed with the intent of assessing for fetal or placental anomalies or any pelvic abnormalities.  Explained that the purpose of today's ultrasound is to assess for presentation, BPP and amniotic fluid volume.  Patient acknowledges the purpose of the exam and the limitations of the study.

## 2018-06-12 NOTE — Progress Notes (Signed)
   PRENATAL VISIT NOTE  Subjective:  Dominique Mora is a 38 y.o. [phone] at [redacted]w[redacted]d being seen today for ongoing prenatal care.  She is currently monitored for the following issues for this high-risk pregnancy and has Asthma; PCO (polycystic ovaries); Hypothyroid; Poor dentition; GAD (generalized anxiety disorder); Tobacco abuse; DM (diabetes mellitus) (HCC); Supervision of high risk pregnancy, antepartum; History of preterm delivery; H/O cesarean section; Pre-existing type 2 diabetes mellitus with hyperglycemia during pregnancy in third trimester (HCC); Spinal muscular atrophy carrier (has one copy); and Obesity in pregnancy on their problem list.  Patient reports no complaints.  Contractions: Irregular. Vag. Bleeding: None.  Movement: (!) Decreased. Denies leaking of fluid.   DM2: on insulin omnipod. Overall, fairly well controlled, but running out of pods early.  The following portions of the patient's history were reviewed and updated as appropriate: allergies, current medications, past family history, past medical history, past social history, past surgical history and problem list.   Objective:   Vitals:   06/12/18 1034  BP: 127/78  Pulse: 98  Temp: 98.3 F (36.8 C)  Weight: 220 lb 4.8 oz (99.9 kg)    Fetal Status: Fetal Heart Rate (bpm): NST   Movement: (!) Decreased     General:  Alert, oriented and cooperative. Patient is in no acute distress.  Skin: Skin is warm and dry. No rash noted.   Cardiovascular: Normal heart rate noted  Respiratory: Normal respiratory effort, no problems with respiration noted  Abdomen: Soft, gravid, appropriate for gestational age.  Pain/Pressure: Present     Pelvic: Cervical exam deferred        Extremities: Normal range of motion.     Mental Status: Normal mood and affect. Normal behavior. Normal judgment and thought content.   Assessment and Plan:  Pregnancy: [id number] at [redacted]w[redacted]d 1. Supervision of high risk pregnancy, antepartum FHT normal  2.  Pre-existing type 2 diabetes mellitus with hyperglycemia during pregnancy in third trimester (HCC) BPP 8/10. Did have a deceleration, but reactive afterwards  3. History of preterm delivery On makena  4. H/O cesarean section Desires TOLAC  5. Spinal muscular atrophy carrier (has one copy) Will need testing following delivery  6. Hypothyroidism, unspecified type   Preterm labor symptoms and general obstetric precautions including but not limited to vaginal bleeding, contractions, leaking of fluid and fetal movement were reviewed in detail with the patient. Please refer to After Visit Summary for other counseling recommendations.   No follow-ups on file.  Future Appointments  Date Time Provider Department Center  06/16/2018 11:15 AM WOC-WOCA NST WOC-WOCA WOC  06/25/2018 12:45 PM WH-MFC NURSE WH-MFC MFC-US  06/25/2018 12:45 PM WH-MFC [place] 5 WH-MFCUS MFC-US  07/03/2018 10:15 AM WOC-WOCA NST WOC-WOCA WOC  07/03/2018 11:15 AM Adam Phenix, MD WOC-WOCA WOC  07/10/2018 10:15 AM WOC-WOCA NST WOC-WOCA WOC  07/10/2018 11:15 AM Adam Phenix, MD WOC-WOCA WOC    Levie Heritage, DO

## 2018-06-16 ENCOUNTER — Ambulatory Visit: Payer: Self-pay

## 2018-06-16 ENCOUNTER — Ambulatory Visit (INDEPENDENT_AMBULATORY_CARE_PROVIDER_SITE_OTHER): Payer: Medicaid Other | Admitting: *Deleted

## 2018-06-16 ENCOUNTER — Other Ambulatory Visit: Payer: Self-pay

## 2018-06-16 VITALS — BP 114/67 | HR 78 | Temp 98.0°F | Wt 221.0 lb

## 2018-06-16 DIAGNOSIS — E1165 Type 2 diabetes mellitus with hyperglycemia: Secondary | ICD-10-CM | POA: Diagnosis not present

## 2018-06-16 DIAGNOSIS — O24113 Pre-existing diabetes mellitus, type 2, in pregnancy, third trimester: Secondary | ICD-10-CM | POA: Diagnosis not present

## 2018-06-16 DIAGNOSIS — O09213 Supervision of pregnancy with history of pre-term labor, third trimester: Secondary | ICD-10-CM | POA: Diagnosis not present

## 2018-06-16 MED ORDER — HYDROXYPROGESTERONE CAPROATE 275 MG/1.1ML ~~LOC~~ SOAJ
275.0000 mg | Freq: Once | SUBCUTANEOUS | Status: AC
  Administered 2018-06-16: 275 mg via SUBCUTANEOUS

## 2018-06-16 NOTE — Progress Notes (Signed)

## 2018-06-17 ENCOUNTER — Other Ambulatory Visit: Payer: Self-pay

## 2018-06-19 ENCOUNTER — Other Ambulatory Visit: Payer: Self-pay

## 2018-06-19 ENCOUNTER — Encounter: Payer: Self-pay | Admitting: Obstetrics & Gynecology

## 2018-06-19 NOTE — Progress Notes (Signed)
I have reviewed this chart and agree with the RN/CMA assessment and management.    Janara Klett C Aliea Bobe, MD, FACOG Attending Physician, [facility]  

## 2018-06-23 ENCOUNTER — Encounter: Payer: Self-pay | Admitting: Obstetrics and Gynecology

## 2018-06-24 ENCOUNTER — Telehealth: Payer: Self-pay | Admitting: Family Medicine

## 2018-06-24 NOTE — Telephone Encounter (Signed)
Attempted to call patient to let her know the office's new address and procedure change. No answer, left detailed message with this information.

## 2018-06-25 ENCOUNTER — Encounter (HOSPITAL_COMMUNITY): Payer: Self-pay

## 2018-06-25 ENCOUNTER — Other Ambulatory Visit: Payer: Self-pay | Admitting: *Deleted

## 2018-06-25 ENCOUNTER — Ambulatory Visit (HOSPITAL_COMMUNITY)
Admission: RE | Admit: 2018-06-25 | Discharge: 2018-06-25 | Disposition: A | Discharge status: Home / Self Care | Payer: Medicaid Other | Source: Ambulatory Visit | Attending: Obstetrics and Gynecology | Admitting: Obstetrics and Gynecology

## 2018-06-25 ENCOUNTER — Ambulatory Visit (HOSPITAL_COMMUNITY): Payer: Medicaid Other | Admitting: *Deleted

## 2018-06-25 ENCOUNTER — Other Ambulatory Visit: Payer: Self-pay

## 2018-06-25 ENCOUNTER — Ambulatory Visit: Payer: Self-pay

## 2018-06-25 VITALS — BP 133/88 | HR 97 | Temp 98.6°F | Wt 227.2 lb

## 2018-06-25 DIAGNOSIS — O0990 Supervision of high risk pregnancy, unspecified, unspecified trimester: Secondary | ICD-10-CM | POA: Insufficient documentation

## 2018-06-25 DIAGNOSIS — O24119 Pre-existing diabetes mellitus, type 2, in pregnancy, unspecified trimester: Secondary | ICD-10-CM | POA: Insufficient documentation

## 2018-06-25 DIAGNOSIS — O24414 Gestational diabetes mellitus in pregnancy, insulin controlled: Secondary | ICD-10-CM

## 2018-06-25 DIAGNOSIS — Z794 Long term (current) use of insulin: Secondary | ICD-10-CM | POA: Insufficient documentation

## 2018-06-25 DIAGNOSIS — O24113 Pre-existing diabetes mellitus, type 2, in pregnancy, third trimester: Secondary | ICD-10-CM | POA: Diagnosis not present

## 2018-06-25 DIAGNOSIS — Z3A36 36 weeks gestation of pregnancy: Secondary | ICD-10-CM | POA: Diagnosis not present

## 2018-06-25 DIAGNOSIS — O99210 Obesity complicating pregnancy, unspecified trimester: Secondary | ICD-10-CM | POA: Insufficient documentation

## 2018-06-25 MED ORDER — VITAFOL GUMMIES 3.33-0.333-34.8 MG PO CHEW: 3.0000 | CHEWABLE_TABLET | Freq: Every day | ORAL | 3 refills | Status: DC

## 2018-06-26 ENCOUNTER — Other Ambulatory Visit: Payer: Self-pay

## 2018-06-26 ENCOUNTER — Encounter: Payer: Self-pay | Admitting: Obstetrics and Gynecology

## 2018-06-30 ENCOUNTER — Encounter: Payer: Self-pay | Admitting: Family Medicine

## 2018-07-01 ENCOUNTER — Other Ambulatory Visit: Payer: Self-pay

## 2018-07-02 ENCOUNTER — Telehealth: Payer: Self-pay | Admitting: Obstetrics and Gynecology

## 2018-07-02 NOTE — Telephone Encounter (Signed)
Called the patient to inform of upcoming appointment scheduled for a face to face. The patient verbalized understanding.

## 2018-07-03 ENCOUNTER — Encounter: Payer: Medicaid Other | Attending: Obstetrics and Gynecology | Admitting: *Deleted

## 2018-07-03 ENCOUNTER — Ambulatory Visit: Payer: Self-pay

## 2018-07-03 ENCOUNTER — Telehealth: Payer: Self-pay | Admitting: Obstetrics & Gynecology

## 2018-07-03 ENCOUNTER — Ambulatory Visit: Payer: Medicaid Other | Admitting: *Deleted

## 2018-07-03 ENCOUNTER — Ambulatory Visit (INDEPENDENT_AMBULATORY_CARE_PROVIDER_SITE_OTHER): Payer: Medicaid Other | Admitting: Obstetrics & Gynecology

## 2018-07-03 ENCOUNTER — Other Ambulatory Visit (HOSPITAL_COMMUNITY)
Admission: RE | Admit: 2018-07-03 | Discharge: 2018-07-03 | Disposition: A | Discharge status: Home / Self Care | Payer: Medicaid Other | Source: Ambulatory Visit | Attending: Obstetrics & Gynecology | Admitting: Obstetrics & Gynecology

## 2018-07-03 ENCOUNTER — Telehealth (HOSPITAL_COMMUNITY): Payer: Self-pay | Admitting: *Deleted

## 2018-07-03 ENCOUNTER — Other Ambulatory Visit: Payer: Self-pay

## 2018-07-03 VITALS — BP 125/81 | HR 80 | Temp 97.8°F | Wt 227.7 lb

## 2018-07-03 DIAGNOSIS — E1165 Type 2 diabetes mellitus with hyperglycemia: Secondary | ICD-10-CM

## 2018-07-03 DIAGNOSIS — O24111 Pre-existing diabetes mellitus, type 2, in pregnancy, first trimester: Secondary | ICD-10-CM | POA: Insufficient documentation

## 2018-07-03 DIAGNOSIS — O0990 Supervision of high risk pregnancy, unspecified, unspecified trimester: Secondary | ICD-10-CM

## 2018-07-03 DIAGNOSIS — Z3A00 Weeks of gestation of pregnancy not specified: Secondary | ICD-10-CM | POA: Insufficient documentation

## 2018-07-03 DIAGNOSIS — O24113 Pre-existing diabetes mellitus, type 2, in pregnancy, third trimester: Principal | ICD-10-CM

## 2018-07-03 DIAGNOSIS — Z8751 Personal history of pre-term labor: Secondary | ICD-10-CM

## 2018-07-03 DIAGNOSIS — Z98891 History of uterine scar from previous surgery: Secondary | ICD-10-CM

## 2018-07-03 DIAGNOSIS — E119 Type 2 diabetes mellitus without complications: Secondary | ICD-10-CM

## 2018-07-03 MED ORDER — INSULIN LISPRO 100 UNIT/ML ~~LOC~~ SOLN: SUBCUTANEOUS | 11 refills | Status: DC

## 2018-07-03 NOTE — Progress Notes (Signed)

## 2018-07-03 NOTE — Telephone Encounter (Signed)
Returned pt's call regarding her preadmission screening.  Pt did not pick up.  Left voicemail informing pt that the nurse she received a phone call from could be reached at the hospital at [phone].

## 2018-07-03 NOTE — Progress Notes (Signed)
   PRENATAL VISIT NOTE  Subjective:  Dominique Mora is a 38 y.o. [phone] at [redacted]w[redacted]d being seen today for ongoing prenatal care.  She is currently monitored for the following issues for this high-risk pregnancy and has Asthma; PCO (polycystic ovaries); Hypothyroid; Poor dentition; GAD (generalized anxiety disorder); Tobacco abuse; DM (diabetes mellitus) (HCC); Supervision of high risk pregnancy, antepartum; History of preterm delivery; H/O cesarean section; Pre-existing type 2 diabetes mellitus with hyperglycemia during pregnancy in third trimester (HCC); Spinal muscular atrophy carrier (has one copy); and Obesity in pregnancy on their problem list.  Patient reports occasional contractions.  Contractions: Irregular. Vag. Bleeding: None.  Movement: Present. Denies leaking of fluid.   The following portions of the patient's history were reviewed and updated as appropriate: allergies, current medications, past family history, past medical history, past social history, past surgical history and problem list.   Objective:   Vitals:   07/03/18 1011  BP: 125/81  Pulse: 80  Temp: 97.8 F (36.6 C)  Weight: 227 lb 11.2 oz (103.3 kg)    Fetal Status: Fetal Heart Rate (bpm): NST   Movement: Present     General:  Alert, oriented and cooperative. Patient is in no acute distress.  Skin: Skin is warm and dry. No rash noted.   Cardiovascular: Normal heart rate noted  Respiratory: Normal respiratory effort, no problems with respiration noted  Abdomen: Soft, gravid, appropriate for gestational age.  Pain/Pressure: Present     Pelvic: Cervical exam performed        Extremities: Normal range of motion.     Mental Status: Normal mood and affect. Normal behavior. Normal judgment and thought content.   Assessment and Plan:  Pregnancy: [id number] at [redacted]w[redacted]d 1. Supervision of high risk pregnancy, antepartum routine - GC/Chlamydia probe amp ([place])not at [facility] - Strep Gp B NAA  2. Pre-existing type 2  diabetes mellitus with hyperglycemia during pregnancy in third trimester (HCC) NST reactive BPP 8/8  3. History of preterm delivery Good control  4. H/O cesarean section TOLAC planned  Term labor symptoms and general obstetric precautions including but not limited to vaginal bleeding, contractions, leaking of fluid and fetal movement were reviewed in detail with the patient. Please refer to After Visit Summary for other counseling recommendations.   Return in about 1 week (around 07/10/2018) for as scheduled.  Future Appointments  Date Time Provider Department Center  07/10/2018 10:15 AM WOC-WOCA NST WOC-WOCA WOC  07/10/2018 11:15 AM Adam Phenix, MD WOC-WOCA WOC  07/16/2018  7:00 AM MC-LD SCHED ROOM MC-INDC None    Scheryl Darter, MD

## 2018-07-03 NOTE — Addendum Note (Signed)
Addended by: Kathee Delton on: 07/03/2018 03:02 PM   Modules accepted: Orders

## 2018-07-03 NOTE — Telephone Encounter (Signed)
Preadmission screen  

## 2018-07-03 NOTE — Progress Notes (Signed)
07/03/2018  EDD: 07/23/2018  Patient here for follow up visit. Patient states she is feeling well but tires easily. She states she is not checking her BG as often as requested but she is consistent with fasting numbers. She continues to make adjustments to meal dose if needed. Her total daily dose of insulin over the past week are typically around 125 units most days with increases for larger meals up to 150 units on occasion.  She states she ran out of insulin recently so we need to update her Rx today.  She continues to use Omnipod for insulin delivery device. Insulin requirements are increasing.   FBG: 95-138  *she ran out of insulin for a couple of days and her BG were above target  Patient needs more overnight insulin to address rising FBG   Increase basal rate in the pod by 10% from 2.7 u/hr to 3.0 u/hr  Patient to consider taking 35 units at breakfast,  lunch and dinner. Patient states she is already is increasing her dose for larger meals and correcting if post meal blood sugars are elevated.   I reviewed with her that her insulin needs will decrease during labor and after delivery. She will be managed during labor on an insulin drip, so pump will be removed or suspended. If she needs insulin after delivery, she will let me know so we can determine doses at that time.  Rx increased based on patient actual intake to 125 units / day x 30 days = 3750 units per month or 4 vials per month.  Patient aware of this plan and Basal Rate settings adjusted before she left.   Patient to notify me if any hypoglycemia or BG's above 200 mg/dl.  Plan follow up after delivery.

## 2018-07-03 NOTE — Patient Instructions (Signed)
Labor Induction    Labor induction is when steps are taken to cause a pregnant woman to begin the labor process. Most women go into labor on their own between 37 weeks and 42 weeks of pregnancy. When this does not happen or when there is a medical need for labor to begin, steps may be taken to induce labor. Labor induction causes a pregnant woman's uterus to contract. It also causes the cervix to soften (ripen), open (dilate), and thin out (efface). Usually, labor is not induced before 39 weeks of pregnancy unless there is a medical reason to do so. Your health care provider will determine if labor induction is needed.  Before inducing labor, your health care provider will consider a number of factors, including:  · Your medical condition and your baby's.  · How many weeks along you are in your pregnancy.  · How mature your baby's lungs are.  · The condition of your cervix.  · The position of your baby.  · The size of your birth canal.  What are some reasons for labor induction?  Labor may be induced if:  · Your health or your baby's health is at risk.  · Your pregnancy is overdue by 1 week or more.  · Your water breaks but labor does not start on its own.  · There is a low amount of amniotic fluid around your baby.  You may also choose (elect) to have labor induced at a certain time. Generally, elective labor induction is done no earlier than 39 weeks of pregnancy.  What methods are used for labor induction?  Methods used for labor induction include:  · Prostaglandin medicine. This medicine starts contractions and causes the cervix to dilate and ripen. It can be taken by mouth (orally) or by being inserted into the vagina (suppository).  · Inserting a small, thin tube (catheter) with a balloon into the vagina and then expanding the balloon with water to dilate the cervix.  · Stripping the membranes. In this method, your health care provider gently separates amniotic sac tissue from the cervix. This causes the  cervix to stretch, which in turn causes the release of a hormone called progesterone. The hormone causes the uterus to contract. This procedure is often done during an office visit, after which you will be sent home to wait for contractions to begin.  · Breaking the water. In this method, your health care provider uses a small instrument to make a small hole in the amniotic sac. This eventually causes the amniotic sac to break. Contractions should begin after a few hours.  · Medicine to trigger or strengthen contractions. This medicine is given through an IV that is inserted into a vein in your arm.  Except for membrane stripping, which can be done in a clinic, labor induction is done in the hospital so that you and your baby can be carefully monitored.  How long does it take for labor to be induced?  The length of time it takes to induce labor depends on how ready your body is for labor. Some inductions can take up to 2-3 days, while others may take less than a day. Induction may take longer if:  · You are induced early in your pregnancy.  · It is your first pregnancy.  · Your cervix is not ready.  What are some risks associated with labor induction?  Some risks associated with labor induction include:  · Changes in fetal heart rate, such as being too   high, too low, or irregular (erratic).  · Failed induction.  · Infection in the mother or the baby.  · Increased risk of having a cesarean delivery.  · Fetal death.  · Breaking off (abruption) of the placenta from the uterus (rare).  · Rupture of the uterus (very rare).  When induction is needed for medical reasons, the benefits of induction generally outweigh the risks.  What are some reasons for not inducing labor?  Labor induction should not be done if:  · Your baby does not tolerate contractions.  · You have had previous surgeries on your uterus, such as a myomectomy, removal of fibroids, or a vertical scar from a previous cesarean delivery.  · Your placenta lies  very low in your uterus and blocks the opening of the cervix (placenta previa).  · Your baby is not in a head-down position.  · The umbilical cord drops down into the birth canal in front of the baby.  · There are unusual circumstances, such as the baby being very early (premature).  · You have had more than 2 previous cesarean deliveries.  Summary  · Labor induction is when steps are taken to cause a pregnant woman to begin the labor process.  · Labor induction causes a pregnant woman's uterus to contract. It also causes the cervix to ripen, dilate, and efface.  · Labor is not induced before 39 weeks of pregnancy unless there is a medical reason to do so.  · When induction is needed for medical reasons, the benefits of induction generally outweigh the risks.  This information is not intended to replace advice given to you by your health care provider. Make sure you discuss any questions you have with your health care provider.  Document Released: 07/18/2006 Document Revised: 04/11/2016 Document Reviewed: 04/11/2016  Elsevier Interactive Patient Education © 2019 [employer]

## 2018-07-04 ENCOUNTER — Other Ambulatory Visit: Payer: Self-pay

## 2018-07-04 ENCOUNTER — Encounter (HOSPITAL_COMMUNITY): Payer: Self-pay | Admitting: *Deleted

## 2018-07-04 ENCOUNTER — Inpatient Hospital Stay (HOSPITAL_COMMUNITY)
Admission: AD | Admit: 2018-07-04 | Discharge: 2018-07-09 | DRG: 786 | Disposition: A | Discharge status: Home / Self Care | Payer: Medicaid Other | Attending: Obstetrics and Gynecology | Admitting: Obstetrics and Gynecology

## 2018-07-04 ENCOUNTER — Telehealth (INDEPENDENT_AMBULATORY_CARE_PROVIDER_SITE_OTHER): Payer: Medicaid Other | Admitting: *Deleted

## 2018-07-04 DIAGNOSIS — O4212 Full-term premature rupture of membranes, onset of labor more than 24 hours following rupture: Secondary | ICD-10-CM | POA: Diagnosis present

## 2018-07-04 DIAGNOSIS — O4290 Premature rupture of membranes, unspecified as to length of time between rupture and onset of labor, unspecified weeks of gestation: Secondary | ICD-10-CM | POA: Diagnosis present

## 2018-07-04 DIAGNOSIS — O99284 Endocrine, nutritional and metabolic diseases complicating childbirth: Secondary | ICD-10-CM | POA: Diagnosis present

## 2018-07-04 DIAGNOSIS — E119 Type 2 diabetes mellitus without complications: Secondary | ICD-10-CM | POA: Diagnosis not present

## 2018-07-04 DIAGNOSIS — Z3A37 37 weeks gestation of pregnancy: Secondary | ICD-10-CM | POA: Diagnosis not present

## 2018-07-04 DIAGNOSIS — Z794 Long term (current) use of insulin: Secondary | ICD-10-CM

## 2018-07-04 DIAGNOSIS — Z148 Genetic carrier of other disease: Secondary | ICD-10-CM

## 2018-07-04 DIAGNOSIS — F411 Generalized anxiety disorder: Secondary | ICD-10-CM | POA: Diagnosis present

## 2018-07-04 DIAGNOSIS — O2412 Pre-existing diabetes mellitus, type 2, in childbirth: Secondary | ICD-10-CM | POA: Diagnosis not present

## 2018-07-04 DIAGNOSIS — O26833 Pregnancy related renal disease, third trimester: Secondary | ICD-10-CM | POA: Diagnosis present

## 2018-07-04 DIAGNOSIS — Z9189 Other specified personal risk factors, not elsewhere classified: Secondary | ICD-10-CM

## 2018-07-04 DIAGNOSIS — O99214 Obesity complicating childbirth: Secondary | ICD-10-CM | POA: Diagnosis present

## 2018-07-04 DIAGNOSIS — O24112 Pre-existing diabetes mellitus, type 2, in pregnancy, second trimester: Secondary | ICD-10-CM

## 2018-07-04 DIAGNOSIS — O0990 Supervision of high risk pregnancy, unspecified, unspecified trimester: Secondary | ICD-10-CM

## 2018-07-04 DIAGNOSIS — Z8751 Personal history of pre-term labor: Secondary | ICD-10-CM

## 2018-07-04 DIAGNOSIS — O99210 Obesity complicating pregnancy, unspecified trimester: Secondary | ICD-10-CM

## 2018-07-04 DIAGNOSIS — O99344 Other mental disorders complicating childbirth: Secondary | ICD-10-CM | POA: Diagnosis present

## 2018-07-04 DIAGNOSIS — F17210 Nicotine dependence, cigarettes, uncomplicated: Secondary | ICD-10-CM | POA: Diagnosis present

## 2018-07-04 DIAGNOSIS — E039 Hypothyroidism, unspecified: Secondary | ICD-10-CM | POA: Diagnosis present

## 2018-07-04 DIAGNOSIS — Z9641 Presence of insulin pump (external) (internal): Secondary | ICD-10-CM | POA: Diagnosis present

## 2018-07-04 DIAGNOSIS — O99334 Smoking (tobacco) complicating childbirth: Secondary | ICD-10-CM | POA: Diagnosis present

## 2018-07-04 DIAGNOSIS — N179 Acute kidney failure, unspecified: Secondary | ICD-10-CM | POA: Diagnosis present

## 2018-07-04 DIAGNOSIS — Z720 Tobacco use: Secondary | ICD-10-CM | POA: Diagnosis present

## 2018-07-04 DIAGNOSIS — O26893 Other specified pregnancy related conditions, third trimester: Secondary | ICD-10-CM | POA: Diagnosis present

## 2018-07-04 DIAGNOSIS — O34211 Maternal care for low transverse scar from previous cesarean delivery: Principal | ICD-10-CM | POA: Diagnosis present

## 2018-07-04 DIAGNOSIS — Z3490 Encounter for supervision of normal pregnancy, unspecified, unspecified trimester: Secondary | ICD-10-CM

## 2018-07-04 DIAGNOSIS — J45909 Unspecified asthma, uncomplicated: Secondary | ICD-10-CM | POA: Diagnosis present

## 2018-07-04 DIAGNOSIS — O411230 Chorioamnionitis, third trimester, not applicable or unspecified: Secondary | ICD-10-CM | POA: Diagnosis not present

## 2018-07-04 DIAGNOSIS — E1165 Type 2 diabetes mellitus with hyperglycemia: Secondary | ICD-10-CM

## 2018-07-04 DIAGNOSIS — Z98891 History of uterine scar from previous surgery: Secondary | ICD-10-CM

## 2018-07-04 LAB — CBC
HCT: 34.3 % — ABNORMAL LOW (ref 36.0–46.0)
Hemoglobin: 11.6 g/dL — ABNORMAL LOW (ref 12.0–15.0)
MCH: 30.1 pg (ref 26.0–34.0)
MCHC: 33.8 g/dL (ref 30.0–36.0)
MCV: 89.1 fL (ref 80.0–100.0)
Platelets: 277 10*3/uL (ref 150–400)
RBC: 3.85 MIL/uL — ABNORMAL LOW (ref 3.87–5.11)
RDW: 14.6 % (ref 11.5–15.5)
WBC: 10.4 10*3/uL (ref 4.0–10.5)
nRBC: 0 % (ref 0.0–0.2)

## 2018-07-04 LAB — TYPE AND SCREEN
ABO/RH(D): A POS
Antibody Screen: NEGATIVE

## 2018-07-04 LAB — GC/CHLAMYDIA PROBE AMP (~~LOC~~) NOT AT ARMC
Chlamydia: NEGATIVE
Neisseria Gonorrhea: NEGATIVE

## 2018-07-04 LAB — GROUP B STREP BY PCR: Group B strep by PCR: NEGATIVE

## 2018-07-04 LAB — POCT FERN TEST: POCT Fern Test: POSITIVE

## 2018-07-04 LAB — ABO/RH: ABO/RH(D): A POS

## 2018-07-04 LAB — GLUCOSE, CAPILLARY: Glucose-Capillary: 130 mg/dL — ABNORMAL HIGH (ref 70–99)

## 2018-07-04 MED ORDER — OXYTOCIN 40 UNITS IN NORMAL SALINE INFUSION - SIMPLE MED: 2.5000 [IU]/h | INTRAVENOUS | Status: DC

## 2018-07-04 MED ORDER — FENTANYL CITRATE (PF) 100 MCG/2ML IJ SOLN
100.0000 ug | INTRAMUSCULAR | Status: DC | PRN
  Administered 2018-07-05 (×4): 100 ug via INTRAVENOUS
  Filled 2018-07-04 (×4): qty 2

## 2018-07-04 MED ORDER — ONDANSETRON HCL 4 MG/2ML IJ SOLN
4.0000 mg | Freq: Four times a day (QID) | INTRAMUSCULAR | Status: DC | PRN
  Administered 2018-07-06: 4 mg via INTRAVENOUS
  Filled 2018-07-04: qty 2

## 2018-07-04 MED ORDER — OXYCODONE-ACETAMINOPHEN 5-325 MG PO TABS: 2.0000 | ORAL_TABLET | ORAL | Status: DC | PRN

## 2018-07-04 MED ORDER — NICOTINE 14 MG/24HR TD PT24
14.0000 mg | MEDICATED_PATCH | Freq: Every day | TRANSDERMAL | Status: DC
  Administered 2018-07-05: 14 mg via TRANSDERMAL
  Filled 2018-07-04: qty 1

## 2018-07-04 MED ORDER — LEVOTHYROXINE SODIUM 50 MCG PO TABS
50.0000 ug | ORAL_TABLET | Freq: Every day | ORAL | Status: DC
  Filled 2018-07-04 (×2): qty 1

## 2018-07-04 MED ORDER — LACTATED RINGERS IV SOLN
500.0000 mL | INTRAVENOUS | Status: DC | PRN
  Administered 2018-07-06: 500 mL via INTRAVENOUS

## 2018-07-04 MED ORDER — SOD CITRATE-CITRIC ACID 500-334 MG/5ML PO SOLN
30.0000 mL | ORAL | Status: DC | PRN
  Administered 2018-07-05: 03:00:00 30 mL via ORAL
  Filled 2018-07-04 (×3): qty 15

## 2018-07-04 MED ORDER — METFORMIN HCL 500 MG PO TABS
500.0000 mg | ORAL_TABLET | Freq: Two times a day (BID) | ORAL | Status: DC
  Administered 2018-07-05: 08:00:00 500 mg via ORAL
  Filled 2018-07-04 (×4): qty 1

## 2018-07-04 MED ORDER — LIDOCAINE HCL (PF) 1 % IJ SOLN: 30.0000 mL | INTRAMUSCULAR | Status: DC | PRN

## 2018-07-04 MED ORDER — LORATADINE 10 MG PO TABS
10.0000 mg | ORAL_TABLET | Freq: Every day | ORAL | Status: DC
  Filled 2018-07-04 (×2): qty 1

## 2018-07-04 MED ORDER — OXYCODONE-ACETAMINOPHEN 5-325 MG PO TABS: 1.0000 | ORAL_TABLET | ORAL | Status: DC | PRN

## 2018-07-04 MED ORDER — LACTATED RINGERS IV SOLN
INTRAVENOUS | Status: DC
  Administered 2018-07-04 – 2018-07-05 (×5): via INTRAVENOUS

## 2018-07-04 MED ORDER — ACETAMINOPHEN 325 MG PO TABS
650.0000 mg | ORAL_TABLET | ORAL | Status: DC | PRN
  Administered 2018-07-06: 650 mg via ORAL
  Filled 2018-07-04: qty 2

## 2018-07-04 MED ORDER — OXYTOCIN BOLUS FROM INFUSION: 500.0000 mL | Freq: Once | INTRAVENOUS | Status: DC

## 2018-07-04 NOTE — Progress Notes (Signed)
Patient ID: Dominique Mora, female   DOB: September 11, 1980, 38 y.o.   MRN: [id number]  Pt leaking some; not really having ctx  VSS, afebrile FHR 140s, +accels, Cat 1 Ctx mild/irreg Cx closed>FT/very soft  IUP@37 .2wks PROM TOLAC T2DM- on pump  Multiple attempts between myself and Dr Despina Hidden to get cervical foley inserted; her cx was so soft that as soon as the balloon was inflated with approx 20-30cc, it would retract out of the cx; it currently has approx 20 cc and was left in place. Will plan for Pitocin when the balloon comes out.  [name] CNM 07/04/2018 10:45 PM

## 2018-07-04 NOTE — H&P (Addendum)
Dominique Mora is a 38 y.o. female G3P1011 @ 37.2wks by 7wk scan presenting for leaking fluid since 0600. Mild ctx since then. Her preg has been followed by the [facility] service since 10wks and has been remarkable for 1) T2DM on pump 2) prev C/S for breech with 34wk PPROM- desires TOLAC 3) asthma 4) SMA carrier- newborn will need testing 5) hypothyroidism- no meds currently with nl TSH in 2nd trimester. EFW at 36wks was 29%.   OB History    Gravida  3   Para  1   Term      Preterm  1   AB  1   Living  1     SAB  1   TAB      Ectopic      Multiple  0   Live Births  1          Past Medical History:  Diagnosis Date  . Asthma   . Diabetes mellitus without complication (HCC)   . Hypothyroidism    Past Surgical History:  Procedure Laterality Date  . CESAREAN SECTION N/A 01/16/2016   Procedure: CESAREAN SECTION;  Surgeon: Hermina Staggers, MD;  Location: [facility] BIRTHING SUITES;  Service: Obstetrics;  Laterality: N/A;  . NO PAST SURGERIES     Family History: family history is not on file. Social History:  reports that she has been smoking cigarettes. She has been smoking about 0.25 packs per day. She has never used smokeless tobacco. She reports that she does not drink alcohol or use drugs.     Maternal Diabetes: Yes:  Diabetes Type:  Pre-pregnancy Genetic Screening: Abnormal:  Results: Other: AFP with elevated risk ONTD- nl u/s Maternal Ultrasounds/Referrals: Normal Fetal Ultrasounds or other Referrals:  None Maternal Substance Abuse:  Yes:  Type: Smoker Significant Maternal Medications:  Meds include: Other:  metformin, insulin pump Significant Maternal Lab Results:  Lab values include: Group B Strep negative Other Comments:  pt is SMA carrier- per genetics, baby will need testing for SMA ASAP  ROS History   Blood pressure 129/78, pulse 90, temperature 98.3 F (36.8 C), temperature source Oral, resp. rate 20, weight 104.6 kg, last menstrual period 08/31/2017, SpO2  98 %. Exam Physical Exam  Constitutional: She is oriented to person, place, and time. She appears well-developed.  HENT:  Head: Normocephalic.  Neck: Normal range of motion.  Cardiovascular: Normal rate.  Respiratory: Effort normal.  GI:  EFM 145-150, +accels, Cat 1 Irreg mild ctx  Genitourinary:    Genitourinary Comments: Vtx by bedside u/s in MAU, fern +   Musculoskeletal: Normal range of motion.  Neurological: She is alert and oriented to person, place, and time.  Skin: Skin is warm and dry.  Psychiatric: She has a normal mood and affect. Her behavior is normal. Thought content normal.    CBG: 130 (had ^ carbs/simple sugars recently)  Prenatal labs: ABO, Rh: A/Positive/-- (10/21 1451) Antibody: Negative (10/21 1451) Rubella: 6.51 (10/21 1451) RPR: Non Reactive (02/13 0904)  HBsAg: Negative (10/21 1451)  HIV: Non Reactive (02/13 0904)  GBS:   PCR negative 07/04/18  Assessment/Plan: IUP@37 .2wks PROM T2DM TOLAC Cx unfavorable GBS neg  -Admit to Labor & Delivery- pt still desires to pursue TOLAC as delivery method -Plan IOL with cervical foley then Pit -Plan to check CGBs q 4 hrs in latent phase then q 2h in active- will keep insulin pump functioning until/unless we need to change to glucostabilizer -Nicotine patch per pt request -Anticipate successful VBAC  Dominique Mora CNM 07/04/2018, 6:20 PM

## 2018-07-04 NOTE — Telephone Encounter (Signed)
Pt left voicemail stating that she believes she is leaking amniotic fluid and that it is running down her leg and soaking her panties.  Requested a call back from the nurse.  Returned pt's call regarding her leaking of fluid.  Advised pt that she needed to go to MAU as soon as she was able.  Pt states she has to wait until 3pm when her significant other gets home from work in order for someone to be able to watch the children.  Pt advised that if she starts having contractions that start to get closer together or more intense that she needed to go on ahead to MAU. Pt verbalized understanding.

## 2018-07-04 NOTE — Progress Notes (Signed)
Leaking clear fluid since 0600, had several gushes. No recent IC. No VB or ctx. +FM. SSE: + pool, fern pos Vtx confirmed by bedside US

## 2018-07-04 NOTE — MAU Note (Signed)
Pt had some leaking fluid starting around 0600 this morning. Has been leaking since, but has seemed to slowed down. No bleedings, some ctx. +FM

## 2018-07-05 ENCOUNTER — Inpatient Hospital Stay (HOSPITAL_COMMUNITY): Payer: Medicaid Other | Admitting: Anesthesiology

## 2018-07-05 LAB — GLUCOSE, CAPILLARY
Glucose-Capillary: 103 mg/dL — ABNORMAL HIGH (ref 70–99)
Glucose-Capillary: 109 mg/dL — ABNORMAL HIGH (ref 70–99)
Glucose-Capillary: 115 mg/dL — ABNORMAL HIGH (ref 70–99)
Glucose-Capillary: 162 mg/dL — ABNORMAL HIGH (ref 70–99)
Glucose-Capillary: 65 mg/dL — ABNORMAL LOW (ref 70–99)
Glucose-Capillary: 84 mg/dL (ref 70–99)
Glucose-Capillary: 89 mg/dL (ref 70–99)
Glucose-Capillary: 91 mg/dL (ref 70–99)
Glucose-Capillary: 95 mg/dL (ref 70–99)

## 2018-07-05 LAB — RPR: RPR Ser Ql: NONREACTIVE

## 2018-07-05 LAB — STREP GP B NAA: Strep Gp B NAA: POSITIVE — AB

## 2018-07-05 MED ORDER — SODIUM CHLORIDE 0.9 % IV SOLN: INTRAVENOUS | Status: DC

## 2018-07-05 MED ORDER — EPHEDRINE 5 MG/ML INJ: 10.0000 mg | INTRAVENOUS | Status: DC | PRN

## 2018-07-05 MED ORDER — INSULIN REGULAR(HUMAN) IN NACL 100-0.9 UT/100ML-% IV SOLN
INTRAVENOUS | Status: DC
  Filled 2018-07-05: qty 100

## 2018-07-05 MED ORDER — INSULIN REGULAR BOLUS VIA INFUSION
0.0000 [IU] | Freq: Three times a day (TID) | INTRAVENOUS | Status: DC
  Filled 2018-07-05: qty 10

## 2018-07-05 MED ORDER — LACTATED RINGERS IV SOLN
500.0000 mL | Freq: Once | INTRAVENOUS | Status: AC
  Administered 2018-07-05: 18:00:00 500 mL via INTRAVENOUS

## 2018-07-05 MED ORDER — DEXTROSE-NACL 5-0.45 % IV SOLN: INTRAVENOUS | Status: DC

## 2018-07-05 MED ORDER — NICOTINE 14 MG/24HR TD PT24
14.0000 mg | MEDICATED_PATCH | Freq: Every day | TRANSDERMAL | Status: DC
  Filled 2018-07-05: qty 1

## 2018-07-05 MED ORDER — NICOTINE 14 MG/24HR TD PT24: 14.0000 mg | MEDICATED_PATCH | Freq: Every day | TRANSDERMAL | Status: DC

## 2018-07-05 MED ORDER — FENTANYL-BUPIVACAINE-NACL 0.5-0.125-0.9 MG/250ML-% EP SOLN
12.0000 mL/h | EPIDURAL | Status: DC | PRN
  Filled 2018-07-05: qty 250

## 2018-07-05 MED ORDER — DIPHENHYDRAMINE HCL 50 MG/ML IJ SOLN
25.0000 mg | Freq: Four times a day (QID) | INTRAMUSCULAR | Status: DC | PRN
  Administered 2018-07-05: 25 mg via INTRAVENOUS
  Filled 2018-07-05: qty 1

## 2018-07-05 MED ORDER — OXYTOCIN 40 UNITS IN NORMAL SALINE INFUSION - SIMPLE MED
1.0000 m[IU]/min | INTRAVENOUS | Status: DC
  Administered 2018-07-05: 2 m[IU]/min via INTRAVENOUS
  Filled 2018-07-05: qty 1000

## 2018-07-05 MED ORDER — LIDOCAINE HCL (PF) 1 % IJ SOLN
INTRAMUSCULAR | Status: DC | PRN
  Administered 2018-07-05 (×2): 5 mL via EPIDURAL

## 2018-07-05 MED ORDER — PHENYLEPHRINE 40 MCG/ML (10ML) SYRINGE FOR IV PUSH (FOR BLOOD PRESSURE SUPPORT)
80.0000 ug | PREFILLED_SYRINGE | INTRAVENOUS | Status: DC | PRN
  Filled 2018-07-05: qty 10

## 2018-07-05 MED ORDER — TERBUTALINE SULFATE 1 MG/ML IJ SOLN
0.2500 mg | Freq: Once | INTRAMUSCULAR | Status: AC | PRN
  Administered 2018-07-06: 0.25 mg via SUBCUTANEOUS
  Filled 2018-07-05: qty 1

## 2018-07-05 MED ORDER — FENTANYL-BUPIVACAINE-NACL 0.5-0.125-0.9 MG/250ML-% EP SOLN: 12.0000 mL/h | EPIDURAL | Status: DC | PRN

## 2018-07-05 MED ORDER — SODIUM CHLORIDE (PF) 0.9 % IJ SOLN
INTRAMUSCULAR | Status: DC | PRN
  Administered 2018-07-05: 12 mL/h via EPIDURAL

## 2018-07-05 MED ORDER — DEXTROSE 50 % IV SOLN: 25.0000 mL | INTRAVENOUS | Status: DC | PRN

## 2018-07-05 MED ORDER — LACTATED RINGERS IV SOLN: 500.0000 mL | Freq: Once | INTRAVENOUS | Status: DC

## 2018-07-05 NOTE — Progress Notes (Addendum)
Patient ID: Dominique Mora, female   DOB: Jan 18, 1981, 38 y.o.   MRN: [id number]  Foley just came out in bathroom; pt with some cramping  BP 119/69, P 79 FHR 130s, +accels, no decels, Cat 1 Irreg ctx Cx deferred  CBG: 103  IUP@37 .3 TOLAC PROM T2DM  Pt will order light carb mod breakfast and then will start Pitocin  [name] [facility] 07/05/2018

## 2018-07-05 NOTE — Plan of Care (Signed)

## 2018-07-05 NOTE — Progress Notes (Signed)
OB/GYN Faculty Practice: Labor Progress Note  Subjective: Late entry from around 10pm. Into room to see patient and introduce self at start of shift, comfortable with epidural. Plan of care also discussed with RN - patient requesting nicotine patch. FB still in place.   Objective: BP (!) 118/56   Pulse 68   Temp 98.2 F (36.8 C) (Oral)   Resp 18   Ht 5\' 4"  (1.626 m)   Wt 104.6 kg   LMP 08/31/2017   SpO2 98%   BMI 39.58 kg/m  Gen: comfortable appearing, NAD Dilation: 3.5(external os) Station: -3 Presentation: Vertex Exam by:: Dr. Aneta Mins  Assessment and Plan: 38 y.o. [id number] [redacted]w[redacted]d here for PROM 07/04/18 at around 0600. Pregnancy also complicated by T2DM, TOLAC.   Labor: PROM > 24 hours. Induction/augmentation has included FB placement and low-dose pitocin given history of C/S. FB difficult to place yesterday and only dilated to about 1cm when removed. Second FB placed today and still in place. Will keep pitocin at 8 until FB out.  -- pain control: epidural in place -- PPH Risk: medium   Fetal Well-Being: EFW 2502g (29%) at 36. Cephalic by sutures on prior checks, FB still in place.  -- Category I - continuous fetal monitoring  -- GBS negative    Type II DM: Not taking medication prior to pregnancy as not regularly seeing primary care. A1C 7.7 at start of pregnancy. Taking metformin 500mg  BID  insulin pump during pregnancy - unclear how high daily dose requirement was. Plan had been to start glucose stabilizer but BG from 162 to 90 without intervention, so not yet started.  -- monitor CBG every 2 hours and start insulin gtt if CBG>120   Jadarion Halbig S. Earlene Plater, DO OB/GYN Fellow, Faculty Practice  11:41 PM

## 2018-07-05 NOTE — Progress Notes (Signed)
Patient ID: Dominique Mora, female   DOB: Aug 01, 1980, 38 y.o.   MRN: [id number]  Pt appears to be sleeping; not disturbed  BP 134/78, P 87, T 98.8 FHR 120-125, +accels, no decels, Cat 1 toco- not tracing Cx deferred   CBG: 95  IUP@term  PROM TOLAC T2DM Cx unfavorable  When foley comes out, plan to start Pit  [name] [facility] 07/05/2018 3:14 AM

## 2018-07-05 NOTE — Anesthesia Procedure Notes (Signed)
Epidural Patient location during procedure: OB Start time: 07/05/2018 6:16 PM End time: 07/05/2018 6:26 PM  Staffing Anesthesiologist: Achille Rich, MD Performed: anesthesiologist   Preanesthetic Checklist Completed: patient identified, site marked, pre-op evaluation, timeout performed, IV checked, risks and benefits discussed and monitors and equipment checked  Epidural Patient position: sitting Prep: DuraPrep Patient monitoring: heart rate, cardiac monitor, continuous pulse ox and blood pressure Approach: midline Location: L2-L3 Injection technique: LOR saline  Needle:  Needle type: Tuohy  Needle gauge: 17 G Needle length: 9 cm Needle insertion depth: 7 cm Catheter type: closed end flexible Catheter size: 19 Gauge Catheter at skin depth: 12 cm Test dose: negative and Other  Assessment Events: blood not aspirated, injection not painful, no injection resistance and negative IV test  Additional Notes Informed consent obtained prior to proceeding including risk of failure, 1% risk of PDPH, risk of minor discomfort and bruising.  Discussed rare but serious complications including epidural abscess, permanent nerve injury, epidural hematoma.  Discussed alternatives to epidural analgesia and patient desires to proceed.  Timeout performed pre-procedure verifying patient name, procedure, and platelet count.  Patient tolerated procedure well. Reason for block:procedure for pain

## 2018-07-05 NOTE — Progress Notes (Signed)
LABOR PROGRESS NOTE  Dominique Mora is a 38 y.o. [phone] at [redacted]w[redacted]d admitted for IOL for PROM  Subjective: Feeling more pain with contractions  Objective: BP (!) 148/94   Pulse 89   Temp 97.7 F (36.5 C) (Oral)   Resp 20   Ht 5\' 4"  (1.626 m)   Wt 104.6 kg   LMP 08/31/2017   SpO2 99%   BMI 39.58 kg/m  or  Vitals:   07/05/18 1241 07/05/18 1243 07/05/18 1423 07/05/18 1426  BP:  134/68  (!) 148/94  Pulse:  75  89  Resp:  20    Temp: 97.7 F (36.5 C)     TempSrc: Oral     SpO2:   99%   Weight:      Height:        Dilation: 3.5(external os) Station: -3 Presentation: Vertex Exam by:: Dr. Janina Mayo: baseline rate 120s, moderate varibility, +acel, no decel Toco: regular q2-57m  Labs: Lab Results  Component Value Date   WBC 10.4 07/04/2018   HGB 11.6 (L) 07/04/2018   HCT 34.3 (L) 07/04/2018   MCV 89.1 07/04/2018   PLT 277 07/04/2018    Patient Active Problem List   Diagnosis Date Noted  . Amniotic fluid leaking 07/04/2018  . Obesity in pregnancy 02/26/2018  . Spinal muscular atrophy carrier (has one copy) 01/27/2018  . Pre-existing type 2 diabetes mellitus with hyperglycemia during pregnancy in third trimester (HCC) 01/17/2018  . Supervision of high risk pregnancy, antepartum 12/30/2017  . History of preterm delivery 12/30/2017  . H/O cesarean section 12/30/2017  . DM (diabetes mellitus) (HCC) 08/30/2016  . Tobacco abuse 01/15/2016  . GAD (generalized anxiety disorder) 11/22/2015  . Asthma 07/12/2015  . PCO (polycystic ovaries) 07/12/2015  . Hypothyroid 07/12/2015  . Poor dentition 07/12/2015    Assessment / Plan: 38 y.o. [id number] at [redacted]w[redacted]d here for IOL for PROM  Labor: FB still in place. Check around bulb with external os ~3-4cm. Internal os off to maternal right and unable to get through. Continue low dose pitocin. Hold at 8.  Fetal Wellbeing:  Cat 1 Pain Control:  Fentanyl PRN. Can have epidural when ready.  Anticipated MOD:  SVD  Zenia Guest,MD OB Fellow  07/05/2018, 3:38 PM

## 2018-07-05 NOTE — Progress Notes (Signed)
Patient Blood Glucose checked and results are 109.  Dr. Aneta Mins called to report results to and recommended we hold starting the Glucose stabilizer.  Order to recheck blood sugar in q 2 hours.  Nurse will check blood glucose at 1830.  Patient VSS and resting comfortably at this time.  Will cont. To monitor.

## 2018-07-05 NOTE — Progress Notes (Signed)
LABOR PROGRESS NOTE  Dominique Mora is a 38 y.o. [phone] at [redacted]w[redacted]d admitted for IOL for PROM  Subjective: No painful contractions just yet Overall feeling fine   Objective: BP 135/80 (BP Location: Left Arm)   Pulse 81   Temp 98.5 F (36.9 C) (Oral)   Resp 20   Ht 5\' 4"  (1.626 m)   Wt 104.6 kg   LMP 08/31/2017   SpO2 98%   BMI 39.58 kg/m  or  Vitals:   07/05/18 0746 07/05/18 0748 07/05/18 0813 07/05/18 0954  BP:  (!) 162/91 (!) 141/76 135/80  Pulse:  65 69 81  Resp:  20 20 20   Temp:  98 F (36.7 C)  98.5 F (36.9 C)  TempSrc:  Oral  Oral  SpO2: 98%     Weight:      Height:        Dilation: 1.5 Station: -3 Exam by:: Dr. Janina Mayo: baseline rate 120s, moderate varibility, + acel, no decel Toco: irregular contractions q2-55m  Labs: Lab Results  Component Value Date   WBC 10.4 07/04/2018   HGB 11.6 (L) 07/04/2018   HCT 34.3 (L) 07/04/2018   MCV 89.1 07/04/2018   PLT 277 07/04/2018    Patient Active Problem List   Diagnosis Date Noted  . Amniotic fluid leaking 07/04/2018  . Obesity in pregnancy 02/26/2018  . Spinal muscular atrophy carrier (has one copy) 01/27/2018  . Pre-existing type 2 diabetes mellitus with hyperglycemia during pregnancy in third trimester (HCC) 01/17/2018  . Supervision of high risk pregnancy, antepartum 12/30/2017  . History of preterm delivery 12/30/2017  . H/O cesarean section 12/30/2017  . DM (diabetes mellitus) (HCC) 08/30/2016  . Tobacco abuse 01/15/2016  . GAD (generalized anxiety disorder) 11/22/2015  . Asthma 07/12/2015  . PCO (polycystic ovaries) 07/12/2015  . Hypothyroid 07/12/2015  . Poor dentition 07/12/2015    Assessment / Plan: Dominique Mora at [redacted]w[redacted]d here for IOL for PROM  Labor: FB replaced with 36ml. Start low dose pitocin Fetal Wellbeing:  Cat 1 Pain Control:  Fentanyl for FB placement Anticipated MOD:  SVD  Dalyla Chui,MD OB Fellow  07/05/2018, 10:14 AM

## 2018-07-05 NOTE — Anesthesia Preprocedure Evaluation (Signed)
Anesthesia Evaluation  Patient identified by MRN, date of birth, ID band Patient awake    Reviewed: Allergy & Precautions, H&P , NPO status , Patient's Chart, lab work & pertinent test results  Airway Mallampati: II   Neck ROM: full    Dental   Pulmonary asthma , Current Smoker,    breath sounds clear to auscultation       Cardiovascular negative cardio ROS   Rhythm:regular Rate:Normal     Neuro/Psych PSYCHIATRIC DISORDERS Anxiety    GI/Hepatic   Endo/Other  diabetes, Type 2, Insulin DependentHypothyroidism Morbid obesity  Renal/GU      Musculoskeletal   Abdominal   Peds  Hematology   Anesthesia Other Findings   Reproductive/Obstetrics (+) Pregnancy                             Anesthesia Physical Anesthesia Plan  ASA: II  Anesthesia Plan: Epidural   Post-op Pain Management:    Induction: Intravenous  PONV Risk Score and Plan: 1 and Treatment may vary due to age or medical condition  Airway Management Planned: Natural Airway  Additional Equipment:   Intra-op Plan:   Post-operative Plan:   Informed Consent: I have reviewed the patients History and Physical, chart, labs and discussed the procedure including the risks, benefits and alternatives for the proposed anesthesia with the patient or authorized representative who has indicated his/her understanding and acceptance.       Plan Discussed with: CRNA, Anesthesiologist and Surgeon  Anesthesia Plan Comments:         Anesthesia Quick Evaluation

## 2018-07-05 NOTE — Progress Notes (Signed)
LABOR PROGRESS NOTE  Dominique Mora is a 38 y.o. [phone] at [redacted]w[redacted]d  admitted for IOL for PROM  Subjective: Contractions painful Now has epidural and comfortable  Objective: BP (!) 155/86 (BP Location: Left Arm)   Pulse 84   Temp 98.2 F (36.8 C) (Oral)   Resp 18   Ht 5\' 4"  (1.626 m)   Wt 104.6 kg   LMP 08/31/2017   SpO2 100%   BMI 39.58 kg/m  or  Vitals:   07/05/18 1946 07/05/18 1951 07/05/18 1956 07/05/18 2000  BP:    (!) 155/86  Pulse:    84  Resp:    18  Temp:      TempSrc:      SpO2: 100% 99% 100% 100%  Weight:      Height:        Dilation: 3.5(external os) Station: -3 Presentation: Vertex Exam by:: Dr. Janina Mayo: baseline rate 130, moderate varibility, + acel, no decel Toco: q2-27m  Labs: Lab Results  Component Value Date   WBC 10.4 07/04/2018   HGB 11.6 (L) 07/04/2018   HCT 34.3 (L) 07/04/2018   MCV 89.1 07/04/2018   PLT 277 07/04/2018    Patient Active Problem List   Diagnosis Date Noted  . Amniotic fluid leaking 07/04/2018  . Obesity in pregnancy 02/26/2018  . Spinal muscular atrophy carrier (has one copy) 01/27/2018  . Pre-existing type 2 diabetes mellitus with hyperglycemia during pregnancy in third trimester (HCC) 01/17/2018  . Supervision of high risk pregnancy, antepartum 12/30/2017  . History of preterm delivery 12/30/2017  . H/O cesarean section 12/30/2017  . DM (diabetes mellitus) (HCC) 08/30/2016  . Tobacco abuse 01/15/2016  . GAD (generalized anxiety disorder) 11/22/2015  . Asthma 07/12/2015  . PCO (polycystic ovaries) 07/12/2015  . Hypothyroid 07/12/2015  . Poor dentition 07/12/2015    Assessment / Plan: 38 y.o. [id number] at [redacted]w[redacted]d here for IOL for PROM  Labor: FB still in. Continue low dose pitocin  Fetal Wellbeing:  Cat 1 Pain Control:  Epidural in place. Comfortable.  Anticipated MOD:  SVD  Rahmel Nedved,MD OB Fellow  07/05/2018, 8:12 PM

## 2018-07-06 ENCOUNTER — Encounter (HOSPITAL_COMMUNITY): Payer: Self-pay

## 2018-07-06 ENCOUNTER — Encounter (HOSPITAL_COMMUNITY)
Admission: AD | Disposition: A | Discharge status: Home / Self Care | Payer: Self-pay | Source: Home / Self Care | Attending: Obstetrics and Gynecology

## 2018-07-06 DIAGNOSIS — O4290 Premature rupture of membranes, unspecified as to length of time between rupture and onset of labor, unspecified weeks of gestation: Secondary | ICD-10-CM | POA: Diagnosis present

## 2018-07-06 DIAGNOSIS — Z3A37 37 weeks gestation of pregnancy: Secondary | ICD-10-CM

## 2018-07-06 DIAGNOSIS — E119 Type 2 diabetes mellitus without complications: Secondary | ICD-10-CM

## 2018-07-06 DIAGNOSIS — O4212 Full-term premature rupture of membranes, onset of labor more than 24 hours following rupture: Secondary | ICD-10-CM

## 2018-07-06 DIAGNOSIS — O2412 Pre-existing diabetes mellitus, type 2, in childbirth: Secondary | ICD-10-CM

## 2018-07-06 DIAGNOSIS — O34211 Maternal care for low transverse scar from previous cesarean delivery: Secondary | ICD-10-CM

## 2018-07-06 LAB — GLUCOSE, CAPILLARY
Glucose-Capillary: 154 mg/dL — ABNORMAL HIGH (ref 70–99)
Glucose-Capillary: 76 mg/dL (ref 70–99)
Glucose-Capillary: 85 mg/dL (ref 70–99)

## 2018-07-06 LAB — CBC
HCT: 32.2 % — ABNORMAL LOW (ref 36.0–46.0)
Hemoglobin: 10.7 g/dL — ABNORMAL LOW (ref 12.0–15.0)
MCH: 29.7 pg (ref 26.0–34.0)
MCHC: 33.2 g/dL (ref 30.0–36.0)
MCV: 89.4 fL (ref 80.0–100.0)
Platelets: 272 10*3/uL (ref 150–400)
RBC: 3.6 MIL/uL — ABNORMAL LOW (ref 3.87–5.11)
RDW: 14.6 % (ref 11.5–15.5)
WBC: 18.5 10*3/uL — ABNORMAL HIGH (ref 4.0–10.5)
nRBC: 0 % (ref 0.0–0.2)

## 2018-07-06 LAB — CREATININE, SERUM
Creatinine, Ser: 1.13 mg/dL — ABNORMAL HIGH (ref 0.44–1.00)
GFR calc Af Amer: >60 mL/min (ref >60)
GFR calc non Af Amer: >60 mL/min (ref >60)

## 2018-07-06 SURGERY — Surgical Case
Anesthesia: Epidural

## 2018-07-06 MED ORDER — OXYCODONE HCL 5 MG PO TABS
5.0000 mg | ORAL_TABLET | ORAL | Status: DC | PRN
  Administered 2018-07-06: 5 mg via ORAL
  Administered 2018-07-07 – 2018-07-08 (×7): 10 mg via ORAL
  Administered 2018-07-08 – 2018-07-09 (×4): 5 mg via ORAL
  Filled 2018-07-06: qty 2
  Filled 2018-07-06 (×3): qty 1
  Filled 2018-07-06 (×3): qty 2
  Filled 2018-07-06: qty 1
  Filled 2018-07-06 (×2): qty 2
  Filled 2018-07-06: qty 1
  Filled 2018-07-06: qty 2

## 2018-07-06 MED ORDER — IBUPROFEN 800 MG PO TABS
800.0000 mg | ORAL_TABLET | Freq: Four times a day (QID) | ORAL | Status: DC
  Administered 2018-07-07 – 2018-07-08 (×3): 800 mg via ORAL
  Filled 2018-07-06 (×3): qty 1

## 2018-07-06 MED ORDER — SOD CITRATE-CITRIC ACID 500-334 MG/5ML PO SOLN
30.0000 mL | ORAL | Status: AC
  Administered 2018-07-06: 04:00:00 30 mL via ORAL

## 2018-07-06 MED ORDER — METFORMIN HCL 500 MG PO TABS
500.0000 mg | ORAL_TABLET | Freq: Two times a day (BID) | ORAL | Status: DC
  Administered 2018-07-06 – 2018-07-09 (×7): 500 mg via ORAL
  Filled 2018-07-06 (×7): qty 1

## 2018-07-06 MED ORDER — LACTATED RINGERS IV SOLN
INTRAVENOUS | Status: DC | PRN
  Administered 2018-07-06: 04:00:00 via INTRAVENOUS

## 2018-07-06 MED ORDER — PRENATAL MULTIVITAMIN CH
1.0000 | ORAL_TABLET | Freq: Every day | ORAL | Status: DC
  Administered 2018-07-06 – 2018-07-09 (×4): 1 via ORAL
  Filled 2018-07-06 (×3): qty 1

## 2018-07-06 MED ORDER — SODIUM CHLORIDE 0.9 % IR SOLN
Status: DC | PRN
  Administered 2018-07-06: 1000 mL

## 2018-07-06 MED ORDER — KETOROLAC TROMETHAMINE 30 MG/ML IJ SOLN
30.0000 mg | Freq: Four times a day (QID) | INTRAMUSCULAR | Status: AC | PRN
  Administered 2018-07-06: 30 mg via INTRAMUSCULAR

## 2018-07-06 MED ORDER — NALBUPHINE HCL 10 MG/ML IJ SOLN: 5.0000 mg | INTRAMUSCULAR | Status: DC | PRN

## 2018-07-06 MED ORDER — COCONUT OIL OIL
1.0000 "application " | TOPICAL_OIL | Status: DC | PRN
  Administered 2018-07-07: 1 via TOPICAL

## 2018-07-06 MED ORDER — DIPHENHYDRAMINE HCL 25 MG PO CAPS: 25.0000 mg | ORAL_CAPSULE | ORAL | Status: DC | PRN

## 2018-07-06 MED ORDER — FENTANYL CITRATE (PF) 100 MCG/2ML IJ SOLN: 25.0000 ug | INTRAMUSCULAR | Status: DC | PRN

## 2018-07-06 MED ORDER — LIDOCAINE-EPINEPHRINE (PF) 2 %-1:200000 IJ SOLN
INTRAMUSCULAR | Status: DC | PRN
  Administered 2018-07-06 (×2): 5 mL via INTRADERMAL

## 2018-07-06 MED ORDER — NALBUPHINE HCL 10 MG/ML IJ SOLN: 5.0000 mg | Freq: Once | INTRAMUSCULAR | Status: DC | PRN

## 2018-07-06 MED ORDER — OXYCODONE HCL 5 MG PO TABS: 5.0000 mg | ORAL_TABLET | Freq: Once | ORAL | Status: DC | PRN

## 2018-07-06 MED ORDER — PHENYLEPHRINE 40 MCG/ML (10ML) SYRINGE FOR IV PUSH (FOR BLOOD PRESSURE SUPPORT)
PREFILLED_SYRINGE | INTRAVENOUS | Status: AC
  Filled 2018-07-06: qty 10

## 2018-07-06 MED ORDER — SENNOSIDES-DOCUSATE SODIUM 8.6-50 MG PO TABS
2.0000 | ORAL_TABLET | ORAL | Status: DC
  Administered 2018-07-06 – 2018-07-08 (×3): 2 via ORAL
  Filled 2018-07-06 (×3): qty 2

## 2018-07-06 MED ORDER — SODIUM CHLORIDE 0.9 % IV SOLN: 500.0000 mg | INTRAVENOUS | Status: DC

## 2018-07-06 MED ORDER — ZOLPIDEM TARTRATE 5 MG PO TABS: 5.0000 mg | ORAL_TABLET | Freq: Every evening | ORAL | Status: DC | PRN

## 2018-07-06 MED ORDER — OXYTOCIN 40 UNITS IN NORMAL SALINE INFUSION - SIMPLE MED: 2.5000 [IU]/h | INTRAVENOUS | Status: AC

## 2018-07-06 MED ORDER — SIMETHICONE 80 MG PO CHEW: 80.0000 mg | CHEWABLE_TABLET | ORAL | Status: DC | PRN

## 2018-07-06 MED ORDER — SIMETHICONE 80 MG PO CHEW
80.0000 mg | CHEWABLE_TABLET | ORAL | Status: DC
  Administered 2018-07-06 – 2018-07-08 (×3): 80 mg via ORAL
  Filled 2018-07-06 (×3): qty 1

## 2018-07-06 MED ORDER — METOCLOPRAMIDE HCL 5 MG/ML IJ SOLN
INTRAMUSCULAR | Status: DC | PRN
  Administered 2018-07-06: 10 mg via INTRAVENOUS

## 2018-07-06 MED ORDER — MEPERIDINE HCL 25 MG/ML IJ SOLN
INTRAMUSCULAR | Status: AC
  Filled 2018-07-06: qty 1

## 2018-07-06 MED ORDER — NALOXONE HCL 0.4 MG/ML IJ SOLN: 0.4000 mg | INTRAMUSCULAR | Status: DC | PRN

## 2018-07-06 MED ORDER — SCOPOLAMINE 1 MG/3DAYS TD PT72
MEDICATED_PATCH | TRANSDERMAL | Status: DC | PRN
  Administered 2018-07-06: 1 via TRANSDERMAL

## 2018-07-06 MED ORDER — METOCLOPRAMIDE HCL 5 MG/ML IJ SOLN
INTRAMUSCULAR | Status: AC
  Filled 2018-07-06: qty 2

## 2018-07-06 MED ORDER — DIBUCAINE (PERIANAL) 1 % EX OINT: 1.0000 "application " | TOPICAL_OINTMENT | CUTANEOUS | Status: DC | PRN

## 2018-07-06 MED ORDER — SODIUM CHLORIDE (PF) 0.9 % IJ SOLN
INTRAMUSCULAR | Status: AC
  Filled 2018-07-06: qty 10

## 2018-07-06 MED ORDER — CEFAZOLIN SODIUM-DEXTROSE 2-4 GM/100ML-% IV SOLN
2.0000 g | INTRAVENOUS | Status: AC
  Administered 2018-07-06: 04:00:00 2 g via INTRAVENOUS

## 2018-07-06 MED ORDER — LACTATED RINGERS IV SOLN
INTRAVENOUS | Status: DC
  Administered 2018-07-06: 13:00:00 via INTRAVENOUS

## 2018-07-06 MED ORDER — MORPHINE SULFATE (PF) 0.5 MG/ML IJ SOLN
INTRAMUSCULAR | Status: AC
  Filled 2018-07-06: qty 10

## 2018-07-06 MED ORDER — MORPHINE SULFATE (PF) 0.5 MG/ML IJ SOLN
INTRAMUSCULAR | Status: DC | PRN
  Administered 2018-07-06: 3 mg via EPIDURAL

## 2018-07-06 MED ORDER — SODIUM CHLORIDE 0.9 % IV SOLN
INTRAVENOUS | Status: DC | PRN
  Administered 2018-07-06: 04:00:00 via INTRAVENOUS

## 2018-07-06 MED ORDER — MEPERIDINE HCL 25 MG/ML IJ SOLN: 6.2500 mg | INTRAMUSCULAR | Status: DC | PRN

## 2018-07-06 MED ORDER — ONDANSETRON HCL 4 MG/2ML IJ SOLN: 4.0000 mg | Freq: Three times a day (TID) | INTRAMUSCULAR | Status: DC | PRN

## 2018-07-06 MED ORDER — WITCH HAZEL-GLYCERIN EX PADS: 1.0000 "application " | MEDICATED_PAD | CUTANEOUS | Status: DC | PRN

## 2018-07-06 MED ORDER — SODIUM CHLORIDE 0.9 % IV SOLN
INTRAVENOUS | Status: DC | PRN
  Administered 2018-07-06: 04:00:00 40 [IU] via INTRAVENOUS

## 2018-07-06 MED ORDER — OXYCODONE HCL 5 MG/5ML PO SOLN: 5.0000 mg | Freq: Once | ORAL | Status: DC | PRN

## 2018-07-06 MED ORDER — SCOPOLAMINE 1 MG/3DAYS TD PT72: 1.0000 | MEDICATED_PATCH | Freq: Once | TRANSDERMAL | Status: DC

## 2018-07-06 MED ORDER — DIPHENHYDRAMINE HCL 25 MG PO CAPS: 25.0000 mg | ORAL_CAPSULE | Freq: Four times a day (QID) | ORAL | Status: DC | PRN

## 2018-07-06 MED ORDER — DIPHENHYDRAMINE HCL 50 MG/ML IJ SOLN: 12.5000 mg | INTRAMUSCULAR | Status: DC | PRN

## 2018-07-06 MED ORDER — CEFAZOLIN SODIUM-DEXTROSE 2-3 GM-%(50ML) IV SOLR: INTRAVENOUS | Status: DC | PRN

## 2018-07-06 MED ORDER — SCOPOLAMINE 1 MG/3DAYS TD PT72
MEDICATED_PATCH | TRANSDERMAL | Status: AC
  Filled 2018-07-06: qty 1

## 2018-07-06 MED ORDER — SODIUM CHLORIDE 0.9 % IV SOLN
INTRAVENOUS | Status: AC
  Filled 2018-07-06: qty 500

## 2018-07-06 MED ORDER — LACTATED RINGERS AMNIOINFUSION
INTRAVENOUS | Status: DC
  Administered 2018-07-06: 03:00:00 via INTRAUTERINE

## 2018-07-06 MED ORDER — ONDANSETRON HCL 4 MG/2ML IJ SOLN
INTRAMUSCULAR | Status: DC | PRN
  Administered 2018-07-06: 4 mg via INTRAVENOUS

## 2018-07-06 MED ORDER — PHENYLEPHRINE HCL (PRESSORS) 10 MG/ML IV SOLN
INTRAVENOUS | Status: DC | PRN
  Administered 2018-07-06 (×2): 80 ug via INTRAVENOUS

## 2018-07-06 MED ORDER — OXYTOCIN 40 UNITS IN NORMAL SALINE INFUSION - SIMPLE MED
INTRAVENOUS | Status: AC
  Filled 2018-07-06: qty 1000

## 2018-07-06 MED ORDER — NALOXONE HCL 4 MG/10ML IJ SOLN
1.0000 ug/kg/h | INTRAVENOUS | Status: DC | PRN
  Filled 2018-07-06: qty 5

## 2018-07-06 MED ORDER — ENOXAPARIN SODIUM 60 MG/0.6ML ~~LOC~~ SOLN
0.5000 mg/kg | SUBCUTANEOUS | Status: DC
  Administered 2018-07-07 – 2018-07-09 (×3): 50 mg via SUBCUTANEOUS
  Filled 2018-07-06 (×3): qty 0.6

## 2018-07-06 MED ORDER — DIPHENHYDRAMINE HCL 50 MG/ML IJ SOLN
INTRAMUSCULAR | Status: AC
  Filled 2018-07-06: qty 1

## 2018-07-06 MED ORDER — KETOROLAC TROMETHAMINE 30 MG/ML IJ SOLN: 30.0000 mg | Freq: Four times a day (QID) | INTRAMUSCULAR | Status: AC | PRN

## 2018-07-06 MED ORDER — DIPHENHYDRAMINE HCL 50 MG/ML IJ SOLN
INTRAMUSCULAR | Status: DC | PRN
  Administered 2018-07-06: 25 mg via INTRAVENOUS

## 2018-07-06 MED ORDER — SIMETHICONE 80 MG PO CHEW
80.0000 mg | CHEWABLE_TABLET | Freq: Three times a day (TID) | ORAL | Status: DC
  Administered 2018-07-06 – 2018-07-09 (×10): 80 mg via ORAL
  Filled 2018-07-06 (×10): qty 1

## 2018-07-06 MED ORDER — KETOROLAC TROMETHAMINE 30 MG/ML IJ SOLN
INTRAMUSCULAR | Status: AC
  Filled 2018-07-06: qty 1

## 2018-07-06 MED ORDER — ONDANSETRON HCL 4 MG/2ML IJ SOLN
INTRAMUSCULAR | Status: AC
  Filled 2018-07-06: qty 2

## 2018-07-06 MED ORDER — LIDOCAINE-EPINEPHRINE (PF) 2 %-1:200000 IJ SOLN
INTRAMUSCULAR | Status: AC
  Filled 2018-07-06: qty 20

## 2018-07-06 MED ORDER — SODIUM CHLORIDE 0.9% FLUSH: 3.0000 mL | INTRAVENOUS | Status: DC | PRN

## 2018-07-06 MED ORDER — LEVOTHYROXINE SODIUM 50 MCG PO TABS
50.0000 ug | ORAL_TABLET | Freq: Every day | ORAL | Status: DC
  Administered 2018-07-07 – 2018-07-09 (×3): 50 ug via ORAL
  Filled 2018-07-06 (×3): qty 1

## 2018-07-06 MED ORDER — ONDANSETRON HCL 4 MG/2ML IJ SOLN: 4.0000 mg | Freq: Four times a day (QID) | INTRAMUSCULAR | Status: DC | PRN

## 2018-07-06 MED ORDER — SODIUM CHLORIDE 0.9 % IV SOLN
500.0000 mg | Freq: Once | INTRAVENOUS | Status: AC
  Administered 2018-07-06: 04:00:00 500 mg via INTRAVENOUS

## 2018-07-06 MED ORDER — KETOROLAC TROMETHAMINE 30 MG/ML IJ SOLN
30.0000 mg | Freq: Four times a day (QID) | INTRAMUSCULAR | Status: AC
  Administered 2018-07-06: 30 mg via INTRAVENOUS
  Filled 2018-07-06 (×2): qty 1

## 2018-07-06 MED ORDER — MENTHOL 3 MG MT LOZG: 1.0000 | LOZENGE | OROMUCOSAL | Status: DC | PRN

## 2018-07-06 MED ORDER — MEPERIDINE HCL 25 MG/ML IJ SOLN
INTRAMUSCULAR | Status: DC | PRN
  Administered 2018-07-06 (×2): 12.5 mg via INTRAVENOUS

## 2018-07-06 SURGICAL SUPPLY — 43 items
BENZOIN TINCTURE PRP APPL 2/3 (GAUZE/BANDAGES/DRESSINGS) ×3 IMPLANT
CANISTER SUCT 3000ML PPV (MISCELLANEOUS) ×3 IMPLANT
CHLORAPREP W/TINT 26ML (MISCELLANEOUS) ×3 IMPLANT
CLOSURE STERI STRIP 1/2 X4 (GAUZE/BANDAGES/DRESSINGS) ×2 IMPLANT
CLOSURE WOUND 1/2 X4 (GAUZE/BANDAGES/DRESSINGS) ×1
DRSG OPSITE POSTOP 4X10 (GAUZE/BANDAGES/DRESSINGS) ×3 IMPLANT
ELECT REM PT RETURN 9FT ADLT (ELECTROSURGICAL) ×3
ELECTRODE REM PT RTRN 9FT ADLT (ELECTROSURGICAL) ×1 IMPLANT
EXTRACTOR VACUUM KIWI (MISCELLANEOUS) ×3 IMPLANT
GLOVE BIOGEL PI IND STRL 7.0 (GLOVE) ×2 IMPLANT
GLOVE BIOGEL PI IND STRL 7.5 (GLOVE) ×1 IMPLANT
GLOVE BIOGEL PI INDICATOR 7.0 (GLOVE) ×4
GLOVE BIOGEL PI INDICATOR 7.5 (GLOVE) ×2
GLOVE SKINSENSE NS SZ7.0 (GLOVE) ×2
GLOVE SKINSENSE STRL SZ7.0 (GLOVE) ×1 IMPLANT
GOWN STRL REUS W/ TWL LRG LVL3 (GOWN DISPOSABLE) ×2 IMPLANT
GOWN STRL REUS W/ TWL XL LVL3 (GOWN DISPOSABLE) ×1 IMPLANT
GOWN STRL REUS W/TWL LRG LVL3 (GOWN DISPOSABLE) ×4
GOWN STRL REUS W/TWL XL LVL3 (GOWN DISPOSABLE) ×2
HOVERMATT SINGLE USE (MISCELLANEOUS) ×3 IMPLANT
NS IRRIG 1000ML POUR BTL (IV SOLUTION) ×3 IMPLANT
PACK C SECTION WH (CUSTOM PROCEDURE TRAY) ×3 IMPLANT
PAD ABD 7.5X8 STRL (GAUZE/BANDAGES/DRESSINGS) ×3 IMPLANT
PAD OB MATERNITY 4.3X12.25 (PERSONAL CARE ITEMS) ×3 IMPLANT
PAD PREP 24X48 CUFFED NSTRL (MISCELLANEOUS) ×3 IMPLANT
PENCIL SMOKE EVAC W/HOLSTER (ELECTROSURGICAL) ×3 IMPLANT
RETRACTOR TRAXI PANNICULUS (MISCELLANEOUS) ×1 IMPLANT
STRIP CLOSURE SKIN 1/2X4 (GAUZE/BANDAGES/DRESSINGS) ×2 IMPLANT
SUT MNCRL 0 VIOLET CTX 36 (SUTURE) ×2 IMPLANT
SUT MON AB 4-0 PS1 27 (SUTURE) ×3 IMPLANT
SUT MONOCRYL 0 CTX 36 (SUTURE) ×4
SUT PLAIN 2 0 (SUTURE) ×2
SUT PLAIN 2 0 XLH (SUTURE) ×3 IMPLANT
SUT PLAIN ABS 2-0 CT1 27XMFL (SUTURE) ×1 IMPLANT
SUT VIC AB 0 CT1 36 (SUTURE) ×6 IMPLANT
SUT VIC AB 2-0 CT1 27 (SUTURE) ×2
SUT VIC AB 2-0 CT1 TAPERPNT 27 (SUTURE) ×1 IMPLANT
SUT VIC AB 3-0 CT1 27 (SUTURE) ×2
SUT VIC AB 3-0 CT1 TAPERPNT 27 (SUTURE) ×1 IMPLANT
SUT VIC AB 4-0 KS 27 (SUTURE) ×3 IMPLANT
TOWEL OR 17X24 6PK STRL BLUE (TOWEL DISPOSABLE) ×6 IMPLANT
TRAXI PANNICULUS RETRACTOR (MISCELLANEOUS) ×2
WATER STERILE IRR 1000ML POUR (IV SOLUTION) ×3 IMPLANT

## 2018-07-06 NOTE — Discharge Summary (Signed)
Obstetrics Discharge Summary OB/GYN Faculty Practice   Patient Name: Dominique Mora DOB: 04/18/1980 MRN: [id number]  Date of admission: 07/04/2018 Delivering MD: [place] Bing   Date of discharge: 07/09/2018  Admitting diagnosis: leaking fluid Intrauterine pregnancy: [redacted]w[redacted]d     Secondary diagnosis:   Principal Problem:   PROM (premature rupture of membranes) Active Problems:   Asthma   Hypothyroid   GAD (generalized anxiety disorder)   Tobacco abuse   DM (diabetes mellitus) (HCC)   History of preterm delivery   H/O cesarean section   Spinal muscular atrophy carrier (has one copy)   Amniotic fluid leaking   AKI (acute kidney injury) [facility])    Discharge diagnosis: Term Pregnancy Delivered by Cesarean Section     Postpartum procedures: None  Complications: None  Outpatient Follow-Up: [ ]  referral to primary care for management of Type II DM   Hospital course: Dominique Mora is a 38 y.o. [redacted]w[redacted]d who was admitted for augmentation of labor for PROM. Her pregnancy was complicated by above noted. Her labor course was notable for FB placement, pitocin, epidural. At 4cm, deep and recurrent variables noted and resuscitation attempts unsuccessful so decision made to proceed with Cesarean section. Delivery was uncomplicated. Please see delivery/op note for additional details. Her postpartum course was uncomplicated. Her HgB trended from 11.6 to 10.0 on PPD#1. She was breast and formula feeding. By day of discharge, she was passing flatus, urinating, eating and drinking without difficulty. Her pain was well-controlled, and she was discharged home with oxycodone and tylenol. For her Type II Diabetes, she was discharged home with metformin 500mg  BID. She will follow-up in clinic in 2 weeks for an incision check and in 4-6 weeks for a postpartum visit. Her creatinine was 1.23 on hospital discharge.  She will need a repeat creatinine level at her postpartum follow up visit.   Physical exam  Vitals:    07/08/18 0627 07/08/18 1400 07/08/18 2247 07/09/18 0528  BP: 119/66 127/76 117/74 121/63  Pulse: 64 85 92 69  Resp: 18 18 18 18   Temp: 97.9 F (36.6 C) 98.8 F (37.1 C) 98 F (36.7 C) 98.4 F (36.9 C)  TempSrc: Oral Oral Oral Oral  SpO2:   97%   Weight:      Height:       General: NAD, cooperative, appears stated age 38: appropriate Uterine Fundus: firm Incision: Healing well with no significant drainage, No significant erythema, Dressing is clean, dry, and intact DVT Evaluation: No evidence of DVT seen on physical exam. Labs: Lab Results  Component Value Date   WBC 18.1 (H) 07/07/2018   HGB 9.7 (L) 07/07/2018   HCT 29.6 (L) 07/07/2018   MCV 89.7 07/07/2018   PLT 281 07/07/2018   CMP Latest Ref Rng & Units 07/09/2018  Glucose 65 - 99 mg/dL -  BUN 6 - 20 mg/dL -  Creatinine 3.21 - 2.24 mg/dL 8.25(O)  Sodium 037 - 048 mmol/L -  Potassium 3.5 - 5.2 mmol/L -  Chloride 96 - 106 mmol/L -  CO2 20 - 29 mmol/L -  Calcium 8.7 - 10.2 mg/dL -  Total Protein 6.0 - 8.5 g/dL -  Total Bilirubin 0.0 - 1.2 mg/dL -  Alkaline Phos 39 - 889 IU/L -  AST 0 - 40 IU/L -  ALT 0 - 32 IU/L -    Discharge instructions: Per After Visit Summary and "Baby and Me Booklet"  After visit meds:  Allergies as of 07/09/2018      Reactions  Clindamycin/lincomycin Nausea And Vomiting, Other (See Comments)   Causes dizziness.      Medication List    STOP taking these medications   Accu-Chek FastClix Lancets Misc   aspirin EC 81 MG tablet   glucose blood test strip Commonly known as:  Accu-Chek Guide   insulin lispro 100 UNIT/ML injection Commonly known as:  HumaLOG   insulin pump Soln     TAKE these medications   acetaminophen 500 MG tablet Commonly known as:  TYLENOL Take 2 tablets (1,000 mg total) by mouth every 6 (six) hours as needed for mild pain, moderate pain, fever or headache.   albuterol 108 (90 Base) MCG/ACT inhaler Commonly known as:  VENTOLIN HFA Inhale 2 puffs  into the lungs every 6 (six) hours as needed for wheezing.   cetirizine 10 MG tablet Commonly known as:  ZYRTEC Take 1 tablet (10 mg total) by mouth daily.   levothyroxine 50 MCG tablet Commonly known as:  SYNTHROID Take 1 tablet (50 mcg total) by mouth daily before breakfast. Reported on 07/11/2015   metFORMIN 500 MG tablet Commonly known as:  Glucophage Take 1 tablet (500 mg total) by mouth 2 (two) times daily with a meal.   oxyCODONE 5 MG immediate release tablet Commonly known as:  Oxy IR/ROXICODONE Take 1-2 tablets (5-10 mg total) by mouth every 4 (four) hours as needed for moderate pain.   polyethylene glycol 17 g packet Commonly known as:  MIRALAX / GLYCOLAX Take 17 g by mouth 2 (two) times daily as needed for moderate constipation.   ROLAIDS PO Take 2 each by mouth 3 (three) times daily as needed (For heartburn).   senna-docusate 8.6-50 MG tablet Commonly known as:  Senokot-S Take 2 tablets by mouth daily. Start taking on:  July 10, 2018   Vitafol Gummies 3.33-0.333-34.8 MG Dominique PalmsChew Chew 3 tablets by mouth daily.       Postpartum contraception: Vasectomy Diet: Routine Diet Activity: Advance as tolerated. Pelvic rest for 6 weeks.   Follow-up Appt: Future Appointments  Date Time Provider Department Center  07/22/2018  2:00 PM [facility] NURSE [facility]  Please schedule this patient for Postpartum visit in: 4 weeks with the following provider: Any provider For C/S patients schedule nurse incision check in weeks 2 weeks: yes High risk pregnancy complicated by: Type II DM, hx prior C/S Delivery mode:  CS Anticipated Birth Control:  vasectomy PP Procedures needed: Incision check  Schedule Integrated BH visit: no  Newborn Data: Live born female  Birth Weight:   APGAR: 2, 8  Newborn Delivery   Birth date/time:  07/06/2018 04:23:00 Delivery type:  C-Section, Low Transverse Trial of labor:  Yes C-section categorization:  Repeat     Baby Feeding: Bottle and  Breast Disposition:home with mother

## 2018-07-06 NOTE — Progress Notes (Signed)
OB Note CTSP for recurrent worsening late decels with q1-21m UCs. IUPC and AI already going and pitocin not running. SVE 4cm and fetus with normal baseline and mod variability but w/ deep variables. D/w her that at this point I'd recommend c-section since SROM for almost 48h at this point, no prior VD and only 4cm with FITL. Pt amenable to plan. Terbutaline given and will also start IVF bolus thru PIV too. Epidural working well. Will do ancef and azithro. D/w anesthesia re: need for urgent c/s.   Cornelia Copa MD Attending Center for [employer] (Faculty Practice) 07/06/2018 Time: [phone]

## 2018-07-06 NOTE — Transfer of Care (Addendum)
Immediate Anesthesia Transfer of Care Note  Patient: Dominique Mora  Procedure(s) Performed: CESAREAN SECTION (N/A )  Patient Location: PACU  Anesthesia Type:Epidural  Level of Consciousness: awake, alert  and oriented  Airway & Oxygen Therapy: Patient Spontanous Breathing  Post-op Assessment: Report given to RN and Post -op Vital signs reviewed and stable  Post vital signs: Reviewed and stable  Last Vitals:  Vitals Value Taken Time  BP 153/132 07/06/2018  5:39 AM  Temp    Pulse 110 07/06/2018  5:43 AM  Resp 20 07/06/2018  5:43 AM  SpO2 98 % 07/06/2018  5:43 AM  Vitals shown include unvalidated device data.  Last Pain:  Vitals:   07/06/18 0250  TempSrc: Oral  PainSc: 0-No pain         Complications: No apparent anesthesia complications

## 2018-07-06 NOTE — Progress Notes (Signed)
Interval Progress Note  Into room to see patient, as she told RN she was feeling more pressure. On SVE, FB is actually stuck behind fetal head. FB deflated and removed. Exam is now 4/60/-3. Will start to increase pitocin titration. Discussed possibility of IUPC placement at later time if difficulty tracing contractions and patient amenable. Category I tracing.  Dominique Mora. Earlene Plater, DO OB/GYN Fellow

## 2018-07-06 NOTE — Op Note (Addendum)
Operative Note   SURGERY DATE: 07/06/2018  PRE-OP DIAGNOSIS:  *Pregnancy at [redacted]w[redacted]d *Fetal Intolerance of Labor @ 4cm (Failed IOL) *History of prior Cesarean delivery *Prolonged Premature Rupture of Membranes  *AMA *DM2  POST-OP DIAGNOSIS: same   PROCEDURE: Urgent repeat low transverse cesarean section via pfannenstiel skin incision with double layer uterine closure  SURGEON: Surgeon(s) and Role:    [place] Bing, MD - Primary    * Earlene Plater, Laurel S, DO - Fellow   ANESTHESIA: epidural dosed to surgical level  ESTIMATED BLOOD LOSS: 238cc per Triton  DRAINS: UOP via indwelling foley  TOTAL IV FLUIDS: crystalloid  VTE PROPHYLAXIS: SCDs to bilateral lower extremities  ANTIBIOTICS: Two grams of Cefazolin were given., within 1 hour of skin incision  500mg  of Azithromycin  SPECIMENS: placenta to pathology  COMPLICATIONS: None immediate   INDICATIONS: Patient presented nearly 48 hours prior with SROM. Was induced and by the time of c-section was 4cm but started having deep and worsening variable decelerations. Given this, slow progress of induction and never having had a vaginal delivery before, I recommended urgent c-section which she was amenable to.   FINDINGS: No intra-abdominal adhesions were noted. Grossly normal uterus, tubes and ovaries. Clear amniotic fluid, cephalic, female infant, weight [id number], APGARs 2/8, intact placenta.  Results for PASHA, AGERS (MRN [id number]) as of 07/07/2018 08:47  Ref. Range 07/06/2018 04:35 07/06/2018 04:36  pH cord blood (arterial) Latest Ref Range: 7.210 - 7.380   7.094 (LL)  pCO2 cord blood (arterial) Latest Ref Range: 42.0 - 56.0 mmHg  71.8 (H)  Bicarbonate Latest Ref Range: 13.0 - 22.0 mmol/L 16.0 21.0  Ph Cord Blood (Venous) Latest Ref Range: 7.240 - 7.380  7.238 (L)   pCO2 Cord Blood (Venous) Latest Ref Range: 42.0 - 56.0  39.0 (L)     PROCEDURE IN DETAIL: The patient was taken to the operating room where  anesthesia was administered and normal fetal heart tones were confirmed. She was then prepped and draped in the normal fashion in the dorsal supine position with a leftward tilt.  After a time out was performed, a pfannensteil skin incision was made just above the preexisting scar with the scalpel and carried through to the underlying layer of fascia. The fascia was then incised at the midline and this incision was extended laterally with the mayo scissors. Attention was turned to the superior aspect of the fascial incision which was grasped with the kocher clamps x 2, tented up and the rectus muscles were dissected off with the scalpel. In a similar fashion the inferior aspect of the fascial incision was grasped with the kocher clamps, tented up and the rectus muscles dissected off with the mayo scissors. The rectus muscles were then separated in the midline and the peritoneum was entered bluntly. The bladder blade was inserted and the vesicouterine peritoneum was identified, tented up and entered with the metzenbaum scissors. It was difficult to extend the incision laterally, so a bladder flap was not created.   A low transverse hysterotomy was made with the scalpel until the endometrial cavity was breached and the amniotic sac ruptured yielding clear amniotic fluid. This incision was extended bluntly and the infant's head, shoulders and body were delivered atraumatically.The cord was clamped x 2 and cut, and the infant was handed to the awaiting pediatricians, after delayed cord clamping was done.  The placenta was then gradually expressed from the uterus and then the uterus was exteriorized and cleared of all clots and  debris. The hysterotomy was repaired with a running suture of 1-0 Monocryl. A second imbricating layer of 1-0 Monocryl suture was then placed. Chromic suture was used in a continuous fashion to further repair a lateral defect  to achieve excellent hemostasis.   The uterus and adnexa were then  returned to the abdomen, and the hysterotomy and all operative sites were reinspected and excellent hemostasis was noted after irrigation and suction of the abdomen with warm saline.  The peritoneum was closed with a running stitch of 3-0 Vicryl. The fascia was reapproximated with 0 Vicryl in a simple running fashion bilaterally. The subcutaneous layer was then reapproximated with interrupted sutures of 2-0 plain gut, and the skin was then closed with 4-0 monocryl, in a subcuticular fashion.  The patient  tolerated the procedure well. Sponge, lap, needle, and instrument counts were correct x 2. The patient was transferred to the recovery room awake, alert and breathing independently in stable condition.  Laurel S. Earlene PlaterWallace, DO OB/GYN Fellow  ACristal Deergree with above. I was present and scrubbed for the entire procedure.   Cornelia Copaharlie Demyah Smyre, Jr MD Attending Center for [facility](Faculty Practice)

## 2018-07-06 NOTE — Progress Notes (Signed)
OB/GYN Faculty Practice: Labor Progress Note  Subjective: Called into room by RN around 0300 because of recurrent variable decelerations.   Objective: BP 118/74   Pulse 90   Temp 99.6 F (37.6 C) (Oral)   Resp 18   Ht 5\' 4"  (1.626 m)   Wt 104.6 kg   LMP 08/31/2017   SpO2 98%   BMI 39.58 kg/m  Gen: comfortable appearing, NAD Dilation: 4 Effacement (%): 60 Station: -3 Presentation: Vertex Exam by:: Dr. Rhett Bannister  Assessment and Plan: 38 y.o. [id number] [redacted]w[redacted]d here for PROM 07/04/18 at around 0600. Pregnancy also complicated by T2DM, TOLAC.   Labor: PROM for nearly 48 hours. Cervical exam only with minimal change - now 90% effaced. IUPC placed to allow for amnioinfusion and better monitoring of contraction/strength of frequency. BSUS confirmed OA presentation. Pitocin turned off because of recurrent variable decelerations. Maternal temperature also elevated but afebrile so concern of developing triple I.  -- pain control: epidural -- PPH Risk: medium   Fetal Well-Being: EFW EFW 2502g (29%) at 36. Cephalic by sutures and BSUS.   -- Category II - continuous fetal monitoring - amnioinfusion running, oxygen placed, trying repositioning, maternal fluid bolus running - administered terbutaline when prolonged decelerations - called Dr. Vergie Living who is in room to see patient to evaluate and discuss need for possible C/S -- GBS negative   Dominique S. Earlene Plater, DO OB/GYN Fellow, Faculty Practice  3:52 AM

## 2018-07-06 NOTE — Lactation Note (Signed)
This note was copied from a baby's chart. Lactation Consultation Note  Patient Name: Dominique Mora CNOBS'J Date: 07/06/2018 Reason for consult: Initial assessment;Early term 37-38.6wks;Primapara  P2 mother whose infant is now 2 hours old.  Mother has a 39 year old who was not breast fed due to NICU admission and mother stated her milk "dried up."  Mother was sitting up in the chair holding baby STS when I arrived.  Discussed the ETI with parents and mother stated that baby "latched right on" and fed after delivery.  Father commented, "He knew what to do right away."  Encouraged mother to observe for feeding cues and to feed him at least 8-12 times/24 hours or sooner if he shows feeding cues.  Mother is familiar with hand expression and I observed her expressing colostrum drops easily.  Colostrum container provided for any EBM she obtains with hand expression.  Milk storage times reviewed and finger feeding demonstrated.    Initiated the DEBP for mother.  #24 flange size changed to a #27 flange size for greater comfort and fit.  Mother denied pain with pumping.  Pump parts, assembly, disassembly and cleaning discussed.  Mother will pump after breast feeding and will call for assistance as needed.  She has a spoon at bedside and we discussed how to spoon feed.  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  I informed her that she will receive a manual pump at discharge.  Mom made aware of O/P services, breastfeeding support groups, community resources, and our phone # for post-discharge questions. Father present.  RN in room and updated.   Maternal Data Formula Feeding for Exclusion: No Has patient been taught Hand Expression?: Yes  Feeding Feeding Type: Bottle Fed - Formula Nipple Type: Slow - flow  LATCH Score                   Interventions    Lactation Tools Discussed/Used WIC Program: Yes Pump Review: Setup,  frequency, and cleaning;Milk Storage Initiated by:: Labresha Mellor Date initiated:: 07/06/18   Consult Status Consult Status: Follow-up Date: 07/07/18 Follow-up type: In-patient    Marifer Hurd R Brailen Macneal 07/06/2018, 12:05 PM

## 2018-07-07 ENCOUNTER — Encounter: Payer: Self-pay | Admitting: Obstetrics and Gynecology

## 2018-07-07 LAB — CBC
HCT: 29.6 % — ABNORMAL LOW (ref 36.0–46.0)
Hemoglobin: 9.7 g/dL — ABNORMAL LOW (ref 12.0–15.0)
MCH: 29.4 pg (ref 26.0–34.0)
MCHC: 32.8 g/dL (ref 30.0–36.0)
MCV: 89.7 fL (ref 80.0–100.0)
Platelets: 281 10*3/uL (ref 150–400)
RBC: 3.3 MIL/uL — ABNORMAL LOW (ref 3.87–5.11)
RDW: 14.9 % (ref 11.5–15.5)
WBC: 18.1 10*3/uL — ABNORMAL HIGH (ref 4.0–10.5)
nRBC: 0 % (ref 0.0–0.2)

## 2018-07-07 LAB — GLUCOSE, CAPILLARY: Glucose-Capillary: 129 mg/dL — ABNORMAL HIGH (ref 70–99)

## 2018-07-07 MED ORDER — FERROUS SULFATE 325 (65 FE) MG PO TABS
325.0000 mg | ORAL_TABLET | Freq: Two times a day (BID) | ORAL | Status: DC
  Administered 2018-07-07 – 2018-07-09 (×4): 325 mg via ORAL
  Filled 2018-07-07 (×4): qty 1

## 2018-07-07 NOTE — Progress Notes (Signed)
Dominique Mora [id number] Postpartum Day 1 S/P Repeat Cesarean Section due to NRFHT s/p Failed TOLAC  Subjective: Patient up ad lib, denies syncope or dizziness. Reports consuming regular diet without issues and denies N/V. Patient reports no bowel movements or passing flatus.  Denies issues with urination and reports bleeding is "not bad."  Patient is breastfeeding and reports going well.  Desires vasectomy for postpartum contraception.  Pain is being appropriately managed with use of ibuprofen.   Objective: Temp:  [98 F (36.7 C)-98.6 F (37 C)] 98.5 F (36.9 C) (04/27 0538) Pulse Rate:  [83-106] 105 (04/27 0538) Resp:  [18-20] 18 (04/27 0538) BP: (108-131)/(56-80) 116/80 (04/27 0538) SpO2:  [96 %-99 %] 99 % (04/27 0140)  Recent Labs    07/04/18 1850 07/06/18 0928 07/07/18 0458  HGB 11.6* 10.7* 9.7*  HCT 34.3* 32.2* 29.6*  WBC 10.4 18.5* 18.1*   CBG 129  Physical Exam:  General: alert, cooperative and no distress Mood/Affect: Appropriate Lungs: clear to auscultation, no wheezes, rales or rhonchi, symmetric air entry.  Heart: normal rate and regular rhythm. Breast: not examined. Abdomen:  + bowel sounds, Soft Incision: No drainage Honeycomb dressing  Uterine Fundus: firm at umbilicus Lochia: appropriate Skin: Warm, Dry. DVT Evaluation: No evidence of DVT seen on physical exam. Calf/Ankle edema is present. JP drain:   None  Assessment Post Operative Day 1 S/P Repeat C/S Normal Involution BreastFeeding Hemodynamically Stable DM Type II  Plan: -Encouraged ambulation. -Encouraged usage of pain medication. -Encouraged hydration. -Reports desire for inpatient circumcision for infant son. *Discussed and patient now considering outpatient, but will discuss with husband. *Instructed to contact nurse if continuing to desire inpatient circumcision.  -Continue other mgmt as ordered. -Treatment team updated on patient status.  Cherre Robins MSN, CNM 07/07/2018, 8:28  AM

## 2018-07-07 NOTE — Lactation Note (Signed)
This note was copied from a baby's chart. Lactation Consultation Note Baby 23 hrs.  Mom states baby has been spitting up during the day. Mom is supplementing w/Similac 22 cal. D/t LBW.  Mom states baby will latch good sometimes. Mom wonders if her nipple is to big for the baby. Mom showed LC her breast. Nipples do not appear to be to large. Baby does have a small mouth. Encouraged mom to call for assistance in latching. Discussed feeding positions. Mom giving baby formula when LC entered rm. Baby only takes close to 5 ml then mom burps baby. Mom states if she burps baby w/small amounts of milk then the baby will not spit up as bad.  Mom states nipples are sore from pumping. Encouraged mom to ask for coconut oil, apply to nipples when she pumps. Mom states she has been pumping every hour and has setting on high to make her milk come in faster. LC discouraged pumping every hour d/t causing trauma to nipples and mom's needs to rest. Mom c/o cramping. Suggested pumping every 3 hrs, apply coconut oil to nipples when pumping. Have pump setting on not causing pain.  Encouraged mom to call for latch assistance before giving formula to assess fit of baby to mom's nipple.  Mom stated she pumped for her baby that came out of NICU for 4 months. Baby wouldn't take anything but a bottle, so mom pumped and bottle fed. Encouraged mom to call for assistance or questions.  Patient Name: Dominique Mora OACZY'S Date: 07/07/2018 Reason for consult: Follow-up assessment;Infant < 6lbs;Early term 37-38.6wks   Maternal Data Does the patient have breastfeeding experience prior to this delivery?: Yes  Feeding Feeding Type: Formula Nipple Type: Slow - flow  LATCH Score       Type of Nipple: Everted at rest and after stimulation  Comfort (Breast/Nipple): Filling, red/small blisters or bruises, mild/mod discomfort(sore)        Interventions Interventions: Breast feeding basics reviewed;Breast  compression;Position options  Lactation Tools Discussed/Used Tools: Pump Breast pump type: Double-Electric Breast Pump   Consult Status Consult Status: Follow-up Date: 07/07/18 Follow-up type: In-patient    Charyl Dancer 07/07/2018, 3:31 AM

## 2018-07-07 NOTE — Anesthesia Postprocedure Evaluation (Signed)
Anesthesia Post Note  Patient: Dominique Mora  Procedure(s) Performed: CESAREAN SECTION (N/A )     Patient location during evaluation: Mother Baby Anesthesia Type: Epidural Level of consciousness: awake and alert Pain management: pain level controlled Vital Signs Assessment: post-procedure vital signs reviewed and stable Respiratory status: spontaneous breathing, nonlabored ventilation and respiratory function stable Cardiovascular status: stable Postop Assessment: no headache, no backache and epidural receding Anesthetic complications: no    Last Vitals:  Vitals:   07/07/18 0140 07/07/18 0538  BP: (!) 116/56 116/80  Pulse: (!) 106 (!) 105  Resp: 18 18  Temp: 36.8 C 36.9 C  SpO2: 99%     Last Pain:  Vitals:   07/07/18 0538  TempSrc: Oral  PainSc: 7                  Nollan Muldrow S

## 2018-07-07 NOTE — Clinical Social Work Maternal (Signed)
CLINICAL SOCIAL WORK MATERNAL/CHILD NOTE  Patient Details  Name: Dominique Mora MRN: [id number] Date of Birth: 11/07/1980  Date:  07/07/2018  Clinical Social Worker Initiating Note:  Ariyannah Pauling Irwin Date/Time: Initiated:  07/07/18/0902     Child's Name:  Kai-Allen Snowdon   Biological Parents:  Mother, Father(Devann Duva and James Belmares DOB: 11/11/1976)   Need for Interpreter:  None   Reason for Referral:  Behavioral Health Concerns   Address:  [address] [place] [address]    Phone number:  [phone] (home) [phone] (work)    Additional phone number:   Household Members/Support Persons (HM/SP):   Household Member/Support Person 1, Household Member/Support Person 2, Household Member/Support Person 3, Household Member/Support Person 4, Household Member/Support Person 5   HM/SP Name Relationship DOB or Age  HM/SP -1 James Cudworth FOB 11/11/1976  HM/SP -2 Dominic Kettles Step-son 03/27/2002  HM/SP -3 Chase Hentges Step-son 06/21/2003  HM/SP -4 Autumn Hansley Step-daughter 05/12/2004  HM/SP -5 Kara Santucci Daughter 01/16/2016  HM/SP -6        HM/SP -7        HM/SP -8          Natural Supports (not living in the home):  Extended Family   Professional Supports:     Employment: Unemployed   Type of Work:     Education:  College graduate   Homebound arranged:    Financial Resources:  Medicaid   Other Resources:  WIC, Food Stamps    Cultural/Religious Considerations Which May Impact Care:    Strengths:  Ability to meet basic needs , Home prepared for child    Psychotropic Medications:         Pediatrician:       Pediatrician List:   [place]    [place]    [place]    [place]    [place]    [place]      Pediatrician Fax Number:    Risk Factors/Current Problems:  Substance Use    Cognitive State:  Able to Concentrate , Alert , Insightful    Mood/Affect:  Calm , Interested  , Other (Comment)(Exhausted)   CSW Assessment: CSW received consult for history of anxiety.  CSW met with MOB to offer support and complete assessment.    MOB bottle-feeding infant when CSW entered the room. MOB appeared comfortable with infant and was appropriate and attentive to infant throughout CSW visit. CSW introduced self and explained reason for consult due to MOB's mental health history. MOB appeared confused but when asked about her anxiety, MOB reported she only experiences anxiety during her pregnancies. MOB did acknowledge experiencing some depression in her late teens but denied anything since. MOB described symptoms of anxiety such as not wanting to leave her house without her husband but attributes her anxiety to her hormones during pregnancy. MOB disclosed to me that she uses marijuana to help cope with her anxiety and to assist with pain. MOB reported her last use was Friday. CSW explained Hospital Drug Policy due to MOB's reported substance use. MOB aware of process as her other child had tested positive while in the hospital after delivery. MOB stated CPS was involved at that time, came out to visit twice and then closed the case. MOB assuming CPS will likely be involved due to last use and is not concerned. MOB denied any further questions or concerns regarding policy. MOB denied any current mental health symptoms and reported she is just exhausted as she was   unable to sleep the night prior. MOB shared she currently lives with her husband, his three teenage children and their 2 year old daughter. MOB confirmed she currently receives Medicaid, WIC and food stamps and is aware of process to get infant added on to her plans.   MOB denied having symptoms of PPD with her other child but was receptive to education regarding PMADs. CSW provided education regarding the baby blues period vs. perinatal mood disorders, discussed treatment and gave resources for mental health follow up if concerns  arise.  CSW recommends self-evaluation during the postpartum time period using the New Mom Checklist from Postpartum Progress and encouraged MOB to contact a medical professional if symptoms are noted at any time. MOB denied any SI, HI or DV and reported having a good support system consisting of FOB, their children and their children's aunt.   MOB confirmed having all essential items for infant once discharged. MOB stated infant would be sleeping in a basinet once home. CSW provided review of Sudden Infant Death Syndrome (SIDS) precautions and safe sleeping habits. MOB denied having any further questions, concerns or needs from CSW at this time.    CSW Plan/Description:  No Further Intervention Required/No Barriers to Discharge, Sudden Infant Death Syndrome (SIDS) Education, Perinatal Mood and Anxiety Disorder (PMADs) Education, Hospital Drug Screen Policy Information, CSW Will Continue to Monitor Umbilical Cord Tissue Drug Screen Results and Make Report if Warranted    Shella Lahman  Irwin, LCSWA 07/07/2018, 11:37 AM 

## 2018-07-08 LAB — GLUCOSE, CAPILLARY: Glucose-Capillary: 134 mg/dL — ABNORMAL HIGH (ref 70–99)

## 2018-07-08 MED ORDER — POLYETHYLENE GLYCOL 3350 17 G PO PACK
17.0000 g | PACK | Freq: Two times a day (BID) | ORAL | Status: DC | PRN
  Administered 2018-07-08: 17 g via ORAL
  Filled 2018-07-08: qty 1

## 2018-07-08 MED ORDER — NICOTINE 7 MG/24HR TD PT24
7.0000 mg | MEDICATED_PATCH | Freq: Every day | TRANSDERMAL | Status: DC
  Administered 2018-07-08 – 2018-07-09 (×3): 7 mg via TRANSDERMAL
  Filled 2018-07-08 (×3): qty 1

## 2018-07-08 MED ORDER — ACETAMINOPHEN 500 MG PO TABS
1000.0000 mg | ORAL_TABLET | Freq: Four times a day (QID) | ORAL | Status: DC | PRN
  Administered 2018-07-08: 1000 mg via ORAL
  Filled 2018-07-08: qty 2

## 2018-07-08 MED ORDER — ONDANSETRON 4 MG PO TBDP
4.0000 mg | ORAL_TABLET | Freq: Three times a day (TID) | ORAL | Status: DC | PRN
  Administered 2018-07-08: 4 mg via ORAL
  Filled 2018-07-08: qty 1

## 2018-07-08 MED ORDER — NICOTINE 7 MG/24HR TD PT24: 7.0000 mg | MEDICATED_PATCH | Freq: Every day | TRANSDERMAL | Status: DC

## 2018-07-08 MED ORDER — IBUPROFEN 600 MG PO TABS
600.0000 mg | ORAL_TABLET | Freq: Four times a day (QID) | ORAL | Status: DC
  Administered 2018-07-08 – 2018-07-09 (×5): 600 mg via ORAL
  Filled 2018-07-08 (×5): qty 1

## 2018-07-08 NOTE — Progress Notes (Signed)
Patient ID: Dominique Mora, female   DOB: [birth year], 38 y.o.   MRN: [id number] I have reviewed the chart and agree with nursing staff's documentation of this patient's encounter.  Scheryl Darter, MD 07/08/2018 9:18 AM

## 2018-07-08 NOTE — Lactation Note (Signed)
This note was copied from a baby's chart. Lactation Consultation Note  Patient Name: Dominique Mora CBULA'G Date: 07/08/2018 Reason for consult: Follow-up assessment;Early term 37-38.6wks;Infant < 6lbs Baby is 22 hours old.  Mom states she has not attempted to put baby to breast today because he is too sleepy.  Baby is receiving Similac 22 calorie.  Mom is pumping every 2-4 hours but not obtaining milk.  Reassured.  Instructed to attempt to latch baby with feeding cues and call for assist prn.  Maternal Data    Feeding Feeding Type: Bottle Fed - Formula Nipple Type: Slow - flow  LATCH Score                   Interventions    Lactation Tools Discussed/Used     Consult Status Consult Status: Follow-up Date: 07/09/18 Follow-up type: In-patient    Huston Foley 07/08/2018, 2:12 PM

## 2018-07-08 NOTE — Progress Notes (Signed)
Subjective: Postpartum Day 2: Cesarean Delivery Patient reports incisional pain, tolerating PO, + flatus and no problems voiding.    Objective: Vital signs in last 24 hours: Temp:  [97.9 F (36.6 C)-98.2 F (36.8 C)] 97.9 F (36.6 C) (04/28 0627) Pulse Rate:  [64-88] 64 (04/28 0627) Resp:  [18] 18 (04/28 0627) BP: (119-135)/(65-79) 119/66 (04/28 9622)  Physical Exam:  General: alert, cooperative, no distress and moderately obese Lochia: appropriate Uterine Fundus: firm Incision: healing well, scant old blood on dressing. Bruising above incision. No erythema or warmth. DVT Evaluation: No evidence of DVT seen on physical exam. Calf/Ankle edema is present, R>L  Recent Labs    07/06/18 0928 07/07/18 0458  HGB 10.7* 9.7*  HCT 32.2* 29.6*    Assessment/Plan: Status post Cesarean section. Doing well postoperatively.  Continue current care.  Dominique Mora 07/08/2018, 9:25 AM

## 2018-07-09 DIAGNOSIS — N179 Acute kidney failure, unspecified: Secondary | ICD-10-CM | POA: Diagnosis present

## 2018-07-09 LAB — GLUCOSE, CAPILLARY: Glucose-Capillary: 108 mg/dL — ABNORMAL HIGH (ref 70–99)

## 2018-07-09 LAB — CREATININE, SERUM
Creatinine, Ser: 1.23 mg/dL — ABNORMAL HIGH (ref 0.44–1.00)
GFR calc Af Amer: >60 mL/min (ref >60)
GFR calc non Af Amer: 56 mL/min — ABNORMAL LOW (ref >60)

## 2018-07-09 MED ORDER — CETIRIZINE HCL 10 MG PO TABS: 10.0000 mg | ORAL_TABLET | Freq: Every day | ORAL | 3 refills | Status: DC

## 2018-07-09 MED ORDER — OXYCODONE HCL 5 MG PO TABS: 5.0000 mg | ORAL_TABLET | ORAL | 0 refills | Status: DC | PRN

## 2018-07-09 MED ORDER — LEVOTHYROXINE SODIUM 50 MCG PO TABS: 50.0000 ug | ORAL_TABLET | Freq: Every day | ORAL | 1 refills | Status: DC

## 2018-07-09 MED ORDER — POLYETHYLENE GLYCOL 3350 17 G PO PACK: 17.0000 g | PACK | Freq: Two times a day (BID) | ORAL | 0 refills | Status: DC | PRN

## 2018-07-09 MED ORDER — METFORMIN HCL 500 MG PO TABS: 500.0000 mg | ORAL_TABLET | Freq: Two times a day (BID) | ORAL | 1 refills | Status: DC

## 2018-07-09 MED ORDER — ACETAMINOPHEN 500 MG PO TABS: 1000.0000 mg | ORAL_TABLET | Freq: Four times a day (QID) | ORAL | 0 refills | Status: DC | PRN

## 2018-07-09 MED ORDER — IBUPROFEN 600 MG PO TABS: 600.0000 mg | ORAL_TABLET | Freq: Four times a day (QID) | ORAL | 0 refills | Status: DC

## 2018-07-09 MED ORDER — SENNOSIDES-DOCUSATE SODIUM 8.6-50 MG PO TABS: 2.0000 | ORAL_TABLET | ORAL | 0 refills | Status: DC

## 2018-07-09 NOTE — Lactation Note (Signed)
This note was copied from a baby's chart. Lactation Consultation Note  Patient Name: Dominique Mora GHWEX'H Date: 07/09/2018   I called into Mom's room at 0920 to inquire if Mom would like a lactation visit before discharge. Mom would like me to come by between 1100-1200 (infant recently fed). Mom says she would like some assist with "sandwiching" her breast. Mom reports that she is getting more milk than just drops now.  Infant noted to be 20g above BW.   Mom +THC use. Hx of asthma, hypothyroidism, & diabetes.   Lurline Hare [facility] 07/09/2018, 9:24 AM

## 2018-07-09 NOTE — Lactation Note (Signed)
This note was copied from a baby's chart. Lactation Consultation Note  Patient Name: Dominique Mora [id number] Date: 07/09/2018   Mom recently pumped 10 ml. Infant does not maintain latch at the breast & has received a number of bottle feedings. Mom says she offers the breast before every bottle feeding.   Mom's nipple is flat & did not allow infant to latch. Infant did better with the nipple shield (size 24), which was prefilled w/small amount of EBM. Mom will latch infant with nipple shield & then finish with bottle feedings, as needed. Mom knows to pump every time infant receives formula.   Mom has a Medela pump at home. With her last child, she needed size 30 flanges, which were provided.   A note was sent electronically for a secretary to call Mom to offer outpatient lactation appt.  Lurline Hare [facility] 07/09/2018, 12:53 PM

## 2018-07-10 ENCOUNTER — Other Ambulatory Visit: Payer: Self-pay

## 2018-07-10 ENCOUNTER — Encounter: Payer: Self-pay | Admitting: Obstetrics & Gynecology

## 2018-07-11 DIAGNOSIS — O24424 Gestational diabetes mellitus in childbirth, insulin controlled: Secondary | ICD-10-CM | POA: Diagnosis not present

## 2018-07-14 ENCOUNTER — Other Ambulatory Visit (INDEPENDENT_AMBULATORY_CARE_PROVIDER_SITE_OTHER): Payer: Medicaid Other

## 2018-07-14 DIAGNOSIS — Z98891 History of uterine scar from previous surgery: Secondary | ICD-10-CM

## 2018-07-14 NOTE — Telephone Encounter (Signed)
Returned pt's call regarding a refill on her oxycodone.  Advised pt that a refill could not be given at this time.  Recommended pt alternate tylenol and motrin and that if the pain is really severe, then she needs to go to [facility] for evaluation. Pt states her pain has much improved, but she has stairs in her house and is going up and down them several times a day.  Pt advised to limit the number of times that she goes up and down the stairs.  Pt advised to keep everything for baby on one level of the house so that she could come downstairs in the morning and not have to keep going upstairs to change baby.  Pt verbalized understanding.

## 2018-07-14 NOTE — Telephone Encounter (Signed)
Pt LVM stating that she needed a refill on her Oxy states she is still in pain from her csection. Wants a call back for refill.

## 2018-07-16 ENCOUNTER — Inpatient Hospital Stay (HOSPITAL_COMMUNITY): Payer: Medicaid Other

## 2018-07-22 ENCOUNTER — Other Ambulatory Visit: Payer: Self-pay

## 2018-07-22 ENCOUNTER — Ambulatory Visit (INDEPENDENT_AMBULATORY_CARE_PROVIDER_SITE_OTHER): Payer: Medicaid Other | Admitting: General Practice

## 2018-07-22 VITALS — BP 126/75 | HR 82 | Ht 64.0 in | Wt 211.0 lb

## 2018-07-22 DIAGNOSIS — Z5189 Encounter for other specified aftercare: Secondary | ICD-10-CM

## 2018-07-22 NOTE — Progress Notes (Signed)
Patient presents to office today for incision check following repeat c-section on 4/26. Patient reports doing well at home, still having some occasional burning/stinging with incision. Incision is clean, dry & intact. Appears to healing well, some suture is still visible. Reviewed wound care with patient and signs & symptoms of infection. Patient verbalized understanding & had no questions.  Chase Caller RN BSN 07/22/18

## 2018-07-23 NOTE — Progress Notes (Signed)
Chart reviewed for nurse visit. Agree with plan of care.   Marylene Land, CNM 07/23/2018 12:37 PM

## 2018-08-07 ENCOUNTER — Ambulatory Visit (INDEPENDENT_AMBULATORY_CARE_PROVIDER_SITE_OTHER): Payer: Medicaid Other | Admitting: Obstetrics and Gynecology

## 2018-08-07 ENCOUNTER — Other Ambulatory Visit: Payer: Self-pay

## 2018-08-07 ENCOUNTER — Encounter: Payer: Self-pay | Admitting: Obstetrics and Gynecology

## 2018-08-07 DIAGNOSIS — L089 Local infection of the skin and subcutaneous tissue, unspecified: Secondary | ICD-10-CM | POA: Diagnosis not present

## 2018-08-07 DIAGNOSIS — N179 Acute kidney failure, unspecified: Secondary | ICD-10-CM

## 2018-08-07 DIAGNOSIS — Z1389 Encounter for screening for other disorder: Secondary | ICD-10-CM

## 2018-08-07 DIAGNOSIS — E1169 Type 2 diabetes mellitus with other specified complication: Secondary | ICD-10-CM

## 2018-08-07 DIAGNOSIS — Z9889 Other specified postprocedural states: Secondary | ICD-10-CM

## 2018-08-07 DIAGNOSIS — T148XXA Other injury of unspecified body region, initial encounter: Secondary | ICD-10-CM | POA: Diagnosis not present

## 2018-08-07 DIAGNOSIS — E039 Hypothyroidism, unspecified: Secondary | ICD-10-CM

## 2018-08-07 DIAGNOSIS — N898 Other specified noninflammatory disorders of vagina: Secondary | ICD-10-CM

## 2018-08-07 MED ORDER — CEPHALEXIN 500 MG PO CAPS: 500.0000 mg | ORAL_CAPSULE | Freq: Four times a day (QID) | ORAL | 2 refills | Status: DC

## 2018-08-07 MED ORDER — METRONIDAZOLE 500 MG PO TABS: 500.0000 mg | ORAL_TABLET | Freq: Two times a day (BID) | ORAL | 0 refills | Status: DC

## 2018-08-07 NOTE — Progress Notes (Signed)
TELEHEALTH POSTPARTUM VIRTUAL VIDEO VISIT ENCOUNTER NOTE  Provider location: [facility] [employer] [facility]   I connected with Dominique Mora on 08/07/18 at  3:15 PM EDT by WebEx Video Encounter at home and verified that I am speaking with the correct person using two identifiers.    I discussed the limitations, risks, security and privacy concerns of performing an evaluation and management service by telephone and the availability of in person appointments. I also discussed with the patient that there may be a patient responsible charge related to this service. The patient expressed understanding and agreed to proceed.  Appointment Date: 08/07/2018  OBGYN Clinic: [facility]  Chief Complaint: Postpartum Visit  History of Present Illness: Dominique Mora is a 38 y.o. Caucasian [id number] being evaluated for postpartum followup.    She is s/p repeat cesarean section on pril 26 at 37.4 weeks; she was discharged to home on April 29th. Pregnancy complicated by T2DM, hypothyroidism. Baby is doing well.  Complains of "rotten smell" at incision, reports there is a small area on the right side that has a little bit of blood when she wipes, but it is slightly opened but very small. Has some tenderness and numbness over area. Patient notices vaginal slimy discharge with fishy odor over the past week. Also with some vaginal itching.  Vaginal bleeding or discharge: Yes - Intercourse: Yes  Contraception: condoms, vasectomy Mode of feeding infant: Bottle PP depression s/s: No .  Any bowel or bladder issues: No  Pap smear: no abnormalities (date: June 2018)  Review of Systems: Positive for n/a. Her 12 point review of systems is negative or as noted in the History of Present Illness.  Patient Active Problem List   Diagnosis Date Noted  . AKI (acute kidney injury) (HCC) 07/09/2018  . Obesity in pregnancy 02/26/2018  . Spinal muscular atrophy carrier (has one copy) 01/27/2018  .  Pre-existing type 2 diabetes mellitus with hyperglycemia during pregnancy in third trimester (HCC) 01/17/2018  . Supervision of high risk pregnancy, antepartum 12/30/2017  . History of preterm delivery 12/30/2017  . H/O cesarean section 12/30/2017  . DM (diabetes mellitus) (HCC) 08/30/2016  . Tobacco abuse 01/15/2016  . GAD (generalized anxiety disorder) 11/22/2015  . Asthma 07/12/2015  . PCO (polycystic ovaries) 07/12/2015  . Hypothyroid 07/12/2015  . Poor dentition 07/12/2015    Medications Makenzy Latham had no medications administered during this visit. Current Outpatient Medications  Medication Sig Dispense Refill  . acetaminophen (TYLENOL) 500 MG tablet Take 2 tablets (1,000 mg total) by mouth every 6 (six) hours as needed for mild pain, moderate pain, fever or headache. 30 tablet 0  . albuterol (VENTOLIN HFA) 108 (90 Base) MCG/ACT inhaler Inhale 2 puffs into the lungs every 6 (six) hours as needed for wheezing.     . cetirizine (ZYRTEC) 10 MG tablet Take 1 tablet (10 mg total) by mouth daily. 30 tablet 3  . levothyroxine (SYNTHROID) 50 MCG tablet Take 1 tablet (50 mcg total) by mouth daily before breakfast. Reported on 07/11/2015 90 tablet 1  . metFORMIN (GLUCOPHAGE) 500 MG tablet Take 1 tablet (500 mg total) by mouth 2 (two) times daily with a meal. 180 tablet 1  . Prenatal Vit-Fe Phos-FA-Omega (VITAFOL GUMMIES) 3.33-0.333-34.8 MG CHEW Chew 3 tablets by mouth daily. 90 tablet 3  . cephALEXin (KEFLEX) 500 MG capsule Take 1 capsule (500 mg total) by mouth 4 (four) times daily. 28 capsule 2  . metroNIDAZOLE (FLAGYL) 500 MG tablet Take 1 tablet (  500 mg total) by mouth 2 (two) times daily. 14 tablet 0   No current facility-administered medications for this visit.     Allergies Clindamycin/lincomycin  Physical Exam:  LMP 08/31/2017   Breastfeeding No   General:  Alert, oriented and cooperative. Patient is in no acute distress.  Mental Status: Normal mood and affect. Normal  behavior. Normal judgment and thought content.   Respiratory: Normal respiratory effort noted, no problems with respiration noted  Rest of physical exam deferred due to type of encounter  PP Depression Screening:   Edinburgh Postnatal Depression Scale - 08/07/18 1534      Edinburgh Postnatal Depression Scale:  In the Past 7 Days   I have been able to laugh and see the funny side of things.  0    I have looked forward with enjoyment to things.  0    I have blamed myself unnecessarily when things went wrong.  0    I have been anxious or worried for no good reason.  1    I have felt scared or panicky for no good reason.  0    Things have been getting on top of me.  3    I have been so unhappy that I have had difficulty sleeping.  0    I have felt sad or miserable.  1    I have been so unhappy that I have been crying.  0    The thought of harming myself has occurred to me.  0    Edinburgh Postnatal Depression Scale Total  5       Assessment: Patient is a 38 y.o. [id number] who is 4 weeks postpartum from a repeat cesarean section.  She is doing well overall. Does have some incisional pain and discharge, as well as some fishy smelling vaginal discharge.   Plan:  1. Postoperative state Doing well  2. Wound infection Suspicious for infection,  Will send keflex to pharmacy To call if no improvement  3. Vaginal discharge Likely BV, will send script to pharmacy  4. Type 2 diabetes mellitus with other specified complication, unspecified whether long term insulin use (HCC) She is not checking her blood sugar or taking her meds - reviewed importance of checking CBGs and controlling BG, reviewed long term sequelae and issues with poorly or uncontrolled diabetes - referral to family medicine made  5. Hypothyroidism, unspecified type Not taking meds as directed, taking every 3 days or so - reviewed importance of taking synthroid and need for possible long term management - referral to family  practice made - will have TSH done   6. AKI (acute kidney injury) (HCC) Will have patient come in for CMP    RTC 6 months for annual  I discussed the assessment and treatment plan with the patient. The patient was provided an opportunity to ask questions and all were answered. The patient agreed with the plan and demonstrated an understanding of the instructions.   The patient was advised to call back or seek an in-person evaluation/go to the ED for any concerning postpartum symptoms.  I provided 18 minutes of face-to-face time during this encounter.   Conan BowensKelly M Davis, MD [facility]

## 2018-08-08 ENCOUNTER — Other Ambulatory Visit: Payer: Self-pay

## 2018-08-08 ENCOUNTER — Other Ambulatory Visit: Payer: Self-pay | Admitting: *Deleted

## 2018-08-08 ENCOUNTER — Other Ambulatory Visit: Payer: Medicaid Other

## 2018-08-08 DIAGNOSIS — N179 Acute kidney failure, unspecified: Secondary | ICD-10-CM

## 2018-08-08 DIAGNOSIS — E039 Hypothyroidism, unspecified: Secondary | ICD-10-CM | POA: Diagnosis not present

## 2018-08-09 LAB — COMPREHENSIVE METABOLIC PANEL
ALT: 31 IU/L (ref 0–32)
AST: 20 IU/L (ref 0–40)
Albumin/Globulin Ratio: 1.5 (ref 1.2–2.2)
Albumin: 4.2 g/dL (ref 3.8–4.8)
Alkaline Phosphatase: 71 IU/L (ref 39–117)
BUN/Creatinine Ratio: 17 (ref 9–23)
BUN: 14 mg/dL (ref 6–20)
Bilirubin Total: 0.4 mg/dL (ref 0.0–1.2)
CO2: 24 mmol/L (ref 20–29)
Calcium: 9.8 mg/dL (ref 8.7–10.2)
Chloride: 97 mmol/L (ref 96–106)
Creatinine, Ser: 0.82 mg/dL (ref 0.57–1.00)
GFR calc Af Amer: 105 mL/min/{1.73_m2} (ref >59)
GFR calc non Af Amer: 91 mL/min/{1.73_m2} (ref >59)
Globulin, Total: 2.8 g/dL (ref 1.5–4.5)
Glucose: 168 mg/dL — ABNORMAL HIGH (ref 65–99)
Potassium: 4.6 mmol/L (ref 3.5–5.2)
Sodium: 138 mmol/L (ref 134–144)
Total Protein: 7 g/dL (ref 6.0–8.5)

## 2018-08-09 LAB — TSH: TSH: 3.11 u[IU]/mL (ref 0.450–4.500)

## 2018-08-12 DIAGNOSIS — O24424 Gestational diabetes mellitus in childbirth, insulin controlled: Secondary | ICD-10-CM | POA: Diagnosis not present

## 2018-09-11 DIAGNOSIS — O24424 Gestational diabetes mellitus in childbirth, insulin controlled: Secondary | ICD-10-CM | POA: Diagnosis not present

## 2018-10-13 DIAGNOSIS — O24424 Gestational diabetes mellitus in childbirth, insulin controlled: Secondary | ICD-10-CM | POA: Diagnosis not present

## 2018-10-30 ENCOUNTER — Encounter: Payer: Self-pay | Admitting: Obstetrics and Gynecology

## 2018-10-30 NOTE — Progress Notes (Unsigned)
Patient ID: Dominique Mora, female   DOB: February 26, 1981, 38 y.o.   MRN: [id number]    Spoke with Rolm Gala with Primary Care @ [facility] [phone]) to get patient scheduled to manage type 2 diabetes. Patient was scheduled on 9/1 @ 2:50. I spoke with the patient and gave her this appointment information. Patient verbalized understanding.

## 2018-11-11 ENCOUNTER — Ambulatory Visit: Payer: Medicaid Other | Admitting: Internal Medicine

## 2018-11-13 DIAGNOSIS — O24424 Gestational diabetes mellitus in childbirth, insulin controlled: Secondary | ICD-10-CM | POA: Diagnosis not present

## 2018-11-21 ENCOUNTER — Telehealth: Payer: Self-pay

## 2018-11-21 NOTE — Telephone Encounter (Signed)

## 2018-11-24 ENCOUNTER — Ambulatory Visit (INDEPENDENT_AMBULATORY_CARE_PROVIDER_SITE_OTHER): Payer: Medicaid Other | Admitting: Family Medicine

## 2018-11-24 ENCOUNTER — Other Ambulatory Visit: Payer: Self-pay

## 2018-11-24 VITALS — BP 122/83 | HR 95 | Temp 97.5°F | Resp 17 | Ht 64.0 in | Wt 215.8 lb

## 2018-11-24 DIAGNOSIS — E1142 Type 2 diabetes mellitus with diabetic polyneuropathy: Secondary | ICD-10-CM | POA: Diagnosis not present

## 2018-11-24 DIAGNOSIS — L299 Pruritus, unspecified: Secondary | ICD-10-CM

## 2018-11-24 DIAGNOSIS — E119 Type 2 diabetes mellitus without complications: Secondary | ICD-10-CM | POA: Diagnosis not present

## 2018-11-24 DIAGNOSIS — Z7689 Persons encountering health services in other specified circumstances: Secondary | ICD-10-CM

## 2018-11-24 DIAGNOSIS — E039 Hypothyroidism, unspecified: Secondary | ICD-10-CM

## 2018-11-24 DIAGNOSIS — L719 Rosacea, unspecified: Secondary | ICD-10-CM | POA: Diagnosis not present

## 2018-11-24 DIAGNOSIS — Z9114 Patient's other noncompliance with medication regimen: Secondary | ICD-10-CM | POA: Diagnosis not present

## 2018-11-24 DIAGNOSIS — Z91148 Patient's other noncompliance with medication regimen for other reason: Secondary | ICD-10-CM

## 2018-11-24 DIAGNOSIS — D649 Anemia, unspecified: Secondary | ICD-10-CM | POA: Diagnosis not present

## 2018-11-24 DIAGNOSIS — J4520 Mild intermittent asthma, uncomplicated: Secondary | ICD-10-CM

## 2018-11-24 DIAGNOSIS — Z79899 Other long term (current) drug therapy: Secondary | ICD-10-CM

## 2018-11-24 DIAGNOSIS — E1140 Type 2 diabetes mellitus with diabetic neuropathy, unspecified: Secondary | ICD-10-CM

## 2018-11-24 DIAGNOSIS — F53 Postpartum depression: Secondary | ICD-10-CM | POA: Diagnosis not present

## 2018-11-24 MED ORDER — GABAPENTIN 300 MG PO CAPS: 300.0000 mg | ORAL_CAPSULE | Freq: Every day | ORAL | 1 refills | Status: DC

## 2018-11-24 MED ORDER — METRONIDAZOLE 0.75 % EX GEL: 1.0000 "application " | Freq: Two times a day (BID) | CUTANEOUS | 0 refills | Status: DC

## 2018-11-24 MED ORDER — METFORMIN HCL 500 MG PO TABS: ORAL_TABLET | ORAL | 1 refills | Status: DC

## 2018-11-24 MED ORDER — LEVOTHYROXINE SODIUM 50 MCG PO TABS: 50.0000 ug | ORAL_TABLET | Freq: Every day | ORAL | 1 refills | Status: DC

## 2018-11-24 MED ORDER — CETIRIZINE HCL 10 MG PO TABS: 10.0000 mg | ORAL_TABLET | Freq: Every day | ORAL | 3 refills | Status: DC

## 2018-11-24 MED ORDER — ALBUTEROL SULFATE HFA 108 (90 BASE) MCG/ACT IN AERS: 2.0000 | INHALATION_SPRAY | Freq: Four times a day (QID) | RESPIRATORY_TRACT | 3 refills | Status: AC | PRN

## 2018-11-24 MED ORDER — METRONIDAZOLE 0.75 % EX GEL: 1.0000 "application " | Freq: Two times a day (BID) | CUTANEOUS | 99 refills | Status: DC

## 2018-11-24 NOTE — Progress Notes (Signed)
Doesn't check blood sugars at home. Has some numbness in her feet & polydipsia.  Denies nausea, vomiting, polyuria, vision changes.  States that she's bad about taking medications. Aims for taking them at least twice a week.

## 2018-11-24 NOTE — Patient Instructions (Signed)
Basics of Medicine Management Taking your medicines correctly is an important part of managing or preventing medical problems. Make sure you know what disease or condition your medicine is treating, and how and when to take it. If you do not take your medicine correctly, it may not work well and may cause unpleasant side effects, including serious health problems. What should I do when I am taking medicines?   Read all the labels and inserts that come with your medicines. Review the information often.  Talk with your pharmacist if you get a refill and notice a change in the size, color, or shape of your medicines.  Know the potential side effects for each medicine that you take.  Try to get all your medicines from the same pharmacy. The pharmacist will have all your information and will understand how your medicines will affect each other (interact).  Tell your health care provider about all your medicines, including over-the-counter medicines, vitamins, and herbal or dietary supplements. He or she will make sure that nothing will interact with any of your prescribed medicines. How can I take my medicines safely?  Take medicines only as told by your health care provider. ? Do not take more of your medicine than instructed. ? Do not take anyone else's medicines. ? Do not share your medicines with others. ? Do not stop taking your medicines unless your health care provider tells you to do so. ? You may need to avoid alcohol or certain foods or liquids when taking certain medicines. Follow your health care provider's instructions.  Do not split, mash, or chew your medicines unless your health care provider tells you to do so. Tell your health care provider if you have trouble swallowing your medicines.  For liquid medicine, use the dosing container that was provided. How should I organize my medicines?  Know your medicines  Know what each of your medicines looks like. This includes size,  color, and shape. Tell your health care provider if you are having trouble recognizing all the medicines that you are taking.  If you cannot tell your medicines apart because they look similar, keep them in original bottles.  If you cannot read the labels on the bottles, tell your pharmacist to put your medicines in containers with large print.  Review your medicines and your schedule with family members, a friend, or a caregiver. Use a pill organizer  Use a tool to organize your medicine schedule. Tools include a weekly pillbox, a written chart, a notebook, or a calendar.  Your tool should help you remember the following things about each medicine: ? The name of the medicine. ? The amount (dose) to take. ? The schedule. This is the day and time the medicine should be taken. ? The appearance. This includes color, shape, size, and stamp. ? How to take your medicines. This includes instructions to take them with food, without food, with fluids, or with other medicines.  Create reminders for taking your medicines. Use sticky notes, or alarms on your watch, mobile device, or phone calendar.  You may choose to use a more advanced management system. These systems have storage, alarms, and visual and audio prompts.  Some medicines can be taken on an "as-needed" basis. These include medicines for nausea or pain. If you take an as-needed medicine, write down the name and dose, as well as the date and time that you took it. How should I plan for travel?  Take your pillbox, medicines, and organization system with   you when traveling.  Have your medicines refilled before you travel. This will ensure that you do not run out of your medicines while you are away from home.  Always carry an updated list of your medicines with you. If there is an emergency, a first responder can quickly see what medicines you are taking.  Do not pack your medicines in checked luggage in case your luggage is lost or  delayed.  If any of your medicines is considered a controlled substance, make sure you bring a letter from your health care provider with you. How should I store and discard my medicines? For safe storage:  Store medicines in a cool, dry area away from light, or as directed by your health care provider. Do not store medicines in the bathroom. Heat and humidity will affect them.  Do not store your medicines with other chemicals, or with medicines for pets or other household members.  Keep medicines away from children and pets. Do not leave them on counters or bedside tables. Store them in high cabinets or on high shelves. For safe disposal:  Check expiration dates regularly. Do not take expired medicines. Discard medicines that are older than the expiration date.  Learn a safe way to dispose of your medicines. You may: ? Use a local government, hospital, or pharmacy medicine-take-back program. ? Mix the medicines with inedible substances, put them in a sealed bag or empty container, and throw them in the trash. What should I remember?  Tell your health care provider if you: ? Experience side effects. ? Have new symptoms. ? Have other concerns about taking your medicines.  Review your medicines regularly with your health care provider. Other medicines, diet, medical conditions, weight changes, and daily habits can all affect how medicines work. Ask if you need to continue taking each medicine, and discuss how well each one is working.  Refill your medicines early to avoid running out of them.  In case of an accidental overdose, call your local Poison Control Center at 1-800-222-1222 or visit your local emergency department immediately. This is important. Summary  Taking your medicines correctly is an important part of managing or preventing medical problems.  You need to make sure that you understand what you are taking a medicine for, as well as how and when you need to take  it.  Know your medicines and use a pill organizer to help you take your medicines correctly.  In case of an accidental overdose, call your local Poison Control Center at 1-800-222-1222 or visit your local emergency department immediately. This is important. This information is not intended to replace advice given to you by your health care provider. Make sure you discuss any questions you have with your health care provider. Document Released: 06/13/2010 Document Revised: 02/21/2017 Document Reviewed: 02/21/2017 Elsevier Patient Education  2020 [employer]  

## 2018-11-24 NOTE — Progress Notes (Signed)
Subjective:  Patient ID: Dominique Mora, female    DOB: 02/20/1981  Age: 38 y.o. MRN: [id number]  CC: Establish Care, Diabetes, and Hypothyroidism   HPI Dominique Dominique Mora, 38 year old female, presents to establish care and for continued medical management of chronic medical issues including type 2 diabetes and hypothyroidism.  She is status post cesarean section birth in April of her second child, a boy.  She reports that during her pregnancy she was very careful about what she ate and monitored her blood sugars as she was told that her diabetes could affect the health and weight of her child.  Since giving birth however she is no longer really watching her diet and does not monitor her blood sugars on a regular basis.  She believes that her blood sugars are likely high as she has had some episodes of increased thirst and urinary frequency.  She also believes that she may be having some postpartum depression.  Patient states that she has become more anxious and she is not sure if this is due to having a second child or because of the toe at 19 pandemic and not being able to go anywhere and not having a lot of additional help.  Patient states that her husband works during the day as she is at home with her son and her young daughter.  Patient states that she feels that she has held her son more because she is afraid that her older child could accidentally bumped into or trip over or otherwise accidentally injure the infant.  Her husband is also at work for long hours and patient feels somewhat isolated.  She denies any suicidal thoughts or ideations.  She does admit to having some abnormal worry about the health of her son as well as her daughter.  She also now becomes concerned about her own health she had something happen to her and she has 2 young children.       She admits that she sometimes forgets to take her medications or will only take medications once per day if she should take them twice daily  such as her metformin.  Patient states that she almost always remembers to take her gabapentin as this helps with nerve pain in her feet from her diabetes and helps her fall asleep.  She would also like to have a refill of generic Zyrtec/cetirizine as if she misses too many days of this medication she will have onset of itching.  Patient states that she was originally prescribed a medication to help with itching.  Patient also would like to have prescription for a cream to use for her rosacea.  She cannot recall the name of the cream that she was prescribed in the past.  She takes her thyroid medication on most days but has skipped a day or 2 here there.  She denies constipation or peripheral edema related to her hypothyroidism but she does have fatigue.  She also has a history of asthma which is sometimes triggered by changes in weather.  She denies any current issues with awakening secondary to shortness of breath, cough or wheezing.  She feels that her asthma is generally well controlled.  Past Medical History:  Diagnosis Date  . Asthma   . Diabetes mellitus without complication (HCC)   . Hypothyroidism     Past Surgical History:  Procedure Laterality Date  . CESAREAN SECTION N/A 01/16/2016   Procedure: CESAREAN SECTION;  Surgeon: Hermina StaggersMichael L Ervin, MD;  [facility];  Service: Obstetrics;  Laterality: N/A;  . CESAREAN SECTION N/A 07/06/2018   Procedure: CESAREAN SECTION;  Surgeon: Fifty-Six BingPickens, Charlie, MD;  Location: [facility] LD ORS;  Service: Obstetrics;  Laterality: N/A;  . TONSILLECTOMY      Family History  Problem Relation Age of Onset  . Schizophrenia Mother   . Alcohol abuse Mother   . Breast cancer Mother   . Diabetes Maternal Grandmother   . Arthritis/Rheumatoid Maternal Grandmother     Social History   Tobacco Use  . Smoking status: Current Every Day Smoker    Packs/day: 0.25    Types: Cigarettes  . Smokeless tobacco: Never Used  Substance Use Topics  . Alcohol use:  No    ROS Review of Systems  Constitutional: Positive for fatigue. Negative for chills and fever.  HENT: Positive for trouble swallowing (sometimes feels like there is mucous in her throat). Negative for tinnitus.   Eyes: Positive for visual disturbance (infrequent; supposed to wear glasses). Negative for photophobia.  Respiratory: Positive for wheezing (occasional - has asthma). Negative for cough and shortness of breath.   Cardiovascular: Negative for chest pain, palpitations and leg swelling.  Gastrointestinal: Positive for nausea (occasional nausea and vomiting with headaches). Negative for abdominal pain, constipation and diarrhea.       Has IBS and gets loose stools with a flare-up  Endocrine: Positive for polydipsia (at times but doesn't check her sugars). Negative for polyphagia and polyuria.  Genitourinary: Negative for dysuria and frequency.  Musculoskeletal: Positive for back pain (low back pain with radiation down her legs) and gait problem (sometimes due to back pain).  Skin: Positive for rash (rosacea on cheeks). Negative for wound.  Neurological: Positive for syncope (sometimes feels as if she may pass out), numbness (left great toe and on the right lateral thigh) and headaches (since she was a teenager; migraines). Negative for dizziness.  Hematological: Negative for adenopathy. Does not bruise/bleed easily.  Psychiatric/Behavioral: Positive for dysphoric mood (feels down/depressed and cries for no reason; thinks she may have postpartum depression). Negative for self-injury and suicidal ideas.    Objective:   Today's Vitals: BP 122/83   Pulse 95   Temp (!) 97.5 F (36.4 C) (Temporal)   Resp 17   Ht 5\' 4"  (1.626 m)   Wt 215 lb 12.8 oz (97.9 kg)   SpO2 94%   BMI 37.04 kg/m   Physical Exam Constitutional:      General: She is not in acute distress.    Appearance: She is obese. She is not ill-appearing.  Neck:     Musculoskeletal: Normal range of motion and neck  supple. No neck rigidity.     Vascular: No carotid bruit.  Cardiovascular:     Rate and Rhythm: Normal rate and regular rhythm.     Pulses:          Dorsalis pedis pulses are 1+ on the right side and 1+ on the left side.       Posterior tibial pulses are 1+ on the right side and 1+ on the left side.     Heart sounds: Normal heart sounds.  Pulmonary:     Effort: Pulmonary effort is normal.     Breath sounds: Normal breath sounds.  Abdominal:     Palpations: Abdomen is soft.     Tenderness: There is no abdominal tenderness. There is no right CVA tenderness, left CVA tenderness, guarding or rebound.  Musculoskeletal:        General: No swelling or tenderness.  Right lower leg: No edema.     Left lower leg: No edema.     Right foot: Normal range of motion. Deformity (Early hammertoe deformities) present.     Left foot: Normal range of motion. Deformity (Early hammertoe deformities) present.     Comments: No CVA tenderness  Feet:     Right foot:     Protective Sensation: 10 sites tested.     Skin integrity: Callus and dry skin present.     Toenail Condition: Right toenails are normal.     Left foot:     Protective Sensation: 10 sites tested.     Skin integrity: Callus and dry skin present.     Toenail Condition: Left toenails are normal.     Comments: Patient with thickened and callused skin on the feet and at today's exam, soles of the feet are very dirty and patient is wearing flip-flops Lymphadenopathy:     Cervical: No cervical adenopathy.  Skin:    General: Skin is warm and dry.  Neurological:     Mental Status: She is alert and oriented to person, place, and time.     Sensory: Sensory deficit (Abnormal monofilament exam of the feet/peripheral neuropathy) present.  Psychiatric:        Mood and Affect: Mood normal.        Behavior: Behavior normal.     Assessment & Plan:   1. Encounter to establish care 2. 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  Patient already has some peripheral neuropathy.  Gabapentin refill provided for continued treatment of diabetic peripheral neuropathy.  Patient will have hemoglobin A1C, comprehensive metabolic panel and urine microalbumin creatinine ratio at today's visit.  Patient will be notified of the results.  She is encouraged to continue her use of Glucophage at this time but will be notified if additional medications are needed based on her results.  She is also being referred to podiatry for further foot care.  Importance of proper shoes was also discussed with the patient. - Comprehensive metabolic panel - Hemoglobin A1c - Microalbumin/Creatinine Ratio, Urine  3. Hypothyroidism Patient will have TSH at today's visit and will be notified if a change is needed in her medication based on today's results.  Medication compliance encouraged.  Refill provided of levothyroxine 50 mcg daily. - TSH  5. Normocytic anemia On review of labs/chart patient with normocytic anemia with hemoglobin of 9.7 on blood work done on 07/07/2018.  Patient is currently taking a prenatal multivitamin but will repeat CBC to see if iron supplementation is also needed or other interventions for treatment of anemia - CBC with Differential  6. Encounter for long-term current use of medication Patient will have CMP in follow-up of long-term use of medication for treatment of diabetes and peripheral neuropathy - Comprehensive metabolic panel  7.  Rosacea Prescription provided for metronidazole gel for treatment of rosacea  8.  Itching Refill provided for generic Zyrtec which she has taken in the past for itching.    9.  Mild intermittent asthma Albuterol HFA refill provided  10.  Postpartum depression She believes that  she may be experiencing some postpartum depression after the birth of her son in April.  She is not interested in medication at this time.  She would be interested in speaking with  medical social worker and referral was placed.  She did not want referral directly to counseling but will discuss possibility of counseling with social worker and is open to receiving information on setting up appointment on her own for counseling if needed.  Outpatient Encounter Medications as of 11/24/2018  Medication Sig  . albuterol (VENTOLIN HFA) 108 (90 Base) MCG/ACT inhaler Inhale 2 puffs into the lungs every 6 (six) hours as needed for wheezing.  . cetirizine (ZYRTEC) 10 MG tablet Take 1 tablet (10 mg total) by mouth daily.  Marland Kitchen levothyroxine (SYNTHROID) 50 MCG tablet Take 1 tablet (50 mcg total) by mouth daily before breakfast. Reported on 07/11/2015  . metFORMIN (GLUCOPHAGE) 500 MG tablet Two pills once daily after a meal  . [DISCONTINUED] albuterol (VENTOLIN HFA) 108 (90 Base) MCG/ACT inhaler Inhale 2 puffs into the lungs every 6 (six) hours as needed for wheezing.   . [DISCONTINUED] cetirizine (ZYRTEC) 10 MG tablet Take 1 tablet (10 mg total) by mouth daily.  . [DISCONTINUED] levothyroxine (SYNTHROID) 50 MCG tablet Take 1 tablet (50 mcg total) by mouth daily before breakfast. Reported on 07/11/2015  . [DISCONTINUED] metFORMIN (GLUCOPHAGE) 500 MG tablet Take 1 tablet (500 mg total) by mouth 2 (two) times daily with a meal.  . gabapentin (NEURONTIN) 300 MG capsule Take 1 capsule (300 mg total) by mouth at bedtime. For nerve pain  . metroNIDAZOLE (METROGEL) 0.75 % gel Apply 1 application topically 2 (two) times daily.  . [DISCONTINUED] acetaminophen (TYLENOL) 500 MG tablet Take 2 tablets (1,000 mg total) by mouth every 6 (six) hours as needed for mild pain, moderate pain, fever or headache.  . [DISCONTINUED] cephALEXin (KEFLEX) 500 MG capsule Take 1 capsule (500 mg total) by mouth 4 (four) times daily.  . [DISCONTINUED]  metroNIDAZOLE (FLAGYL) 500 MG tablet Take 1 tablet (500 mg total) by mouth 2 (two) times daily.  . [DISCONTINUED] metroNIDAZOLE (METROGEL) 0.75 % gel Apply 1 application topically 2 (two) times daily.  . [DISCONTINUED] Prenatal Vit-Fe Phos-FA-Omega (VITAFOL GUMMIES) 3.33-0.333-34.8 MG CHEW Chew 3 tablets by mouth daily.   No facility-administered encounter medications on file as of 11/24/2018.     An After Visit Summary was printed and given to the patient.  Follow-up: Return in about 4 weeks (around 12/22/2018) for DM and other issues in 4-6 weeks.   Pce-Covering Provider

## 2018-11-25 ENCOUNTER — Other Ambulatory Visit: Payer: Self-pay

## 2018-11-25 DIAGNOSIS — L719 Rosacea, unspecified: Secondary | ICD-10-CM

## 2018-11-25 LAB — COMPREHENSIVE METABOLIC PANEL WITH GFR
ALT: 54 IU/L — ABNORMAL HIGH (ref 0–32)
AST: 40 IU/L (ref 0–40)
Albumin/Globulin Ratio: 1.6 (ref 1.2–2.2)
Albumin: 4.7 g/dL (ref 3.8–4.8)
Alkaline Phosphatase: 61 IU/L (ref 39–117)
BUN/Creatinine Ratio: 13 (ref 9–23)
BUN: 12 mg/dL (ref 6–20)
Bilirubin Total: 0.3 mg/dL (ref 0.0–1.2)
CO2: 23 mmol/L (ref 20–29)
Calcium: 9.9 mg/dL (ref 8.7–10.2)
Chloride: 97 mmol/L (ref 96–106)
Creatinine, Ser: 0.91 mg/dL (ref 0.57–1.00)
GFR calc Af Amer: 93 mL/min/1.73
GFR calc non Af Amer: 80 mL/min/1.73
Globulin, Total: 2.9 g/dL (ref 1.5–4.5)
Glucose: 277 mg/dL — ABNORMAL HIGH (ref 65–99)
Potassium: 4.8 mmol/L (ref 3.5–5.2)
Sodium: 140 mmol/L (ref 134–144)
Total Protein: 7.6 g/dL (ref 6.0–8.5)

## 2018-11-25 LAB — MICROALBUMIN / CREATININE URINE RATIO
Creatinine, Urine: 110.3 mg/dL
Microalb/Creat Ratio: 5 mg/g{creat} (ref 0–29)
Microalbumin, Urine: 6 ug/mL

## 2018-11-25 LAB — CBC WITH DIFFERENTIAL/PLATELET
Basophils Absolute: 0.1 x10E3/uL (ref 0.0–0.2)
Basos: 1 %
EOS (ABSOLUTE): 0.4 x10E3/uL (ref 0.0–0.4)
Eos: 3 %
Hematocrit: 42.9 % (ref 34.0–46.6)
Hemoglobin: 14.3 g/dL (ref 11.1–15.9)
Immature Grans (Abs): 0 x10E3/uL (ref 0.0–0.1)
Immature Granulocytes: 0 %
Lymphocytes Absolute: 3.3 x10E3/uL — ABNORMAL HIGH (ref 0.7–3.1)
Lymphs: 29 %
MCH: 29.2 pg (ref 26.6–33.0)
MCHC: 33.3 g/dL (ref 31.5–35.7)
MCV: 88 fL (ref 79–97)
Monocytes Absolute: 0.6 x10E3/uL (ref 0.1–0.9)
Monocytes: 5 %
Neutrophils Absolute: 6.8 x10E3/uL (ref 1.4–7.0)
Neutrophils: 62 %
Platelets: 383 x10E3/uL (ref 150–450)
RBC: 4.9 x10E6/uL (ref 3.77–5.28)
RDW: 13.6 % (ref 11.7–15.4)
WBC: 11.2 x10E3/uL — ABNORMAL HIGH (ref 3.4–10.8)

## 2018-11-25 LAB — HEMOGLOBIN A1C
Est. average glucose Bld gHb Est-mCnc: 212 mg/dL
Hgb A1c MFr Bld: 9 % — ABNORMAL HIGH (ref 4.8–5.6)

## 2018-11-25 LAB — TSH: TSH: 2.92 u[IU]/mL (ref 0.450–4.500)

## 2018-11-25 MED ORDER — METROGEL 1 % EX GEL: Freq: Two times a day (BID) | CUTANEOUS | 0 refills | Status: AC | PRN

## 2018-11-27 NOTE — Progress Notes (Signed)
Patient notified of results & recommendations. Expressed understanding.

## 2018-12-12 DIAGNOSIS — O24424 Gestational diabetes mellitus in childbirth, insulin controlled: Secondary | ICD-10-CM | POA: Diagnosis not present

## 2018-12-15 ENCOUNTER — Ambulatory Visit: Payer: Medicaid Other | Admitting: Podiatry

## 2018-12-15 ENCOUNTER — Other Ambulatory Visit: Payer: Self-pay

## 2018-12-15 DIAGNOSIS — L989 Disorder of the skin and subcutaneous tissue, unspecified: Secondary | ICD-10-CM | POA: Diagnosis not present

## 2018-12-15 DIAGNOSIS — M7751 Other enthesopathy of right foot: Secondary | ICD-10-CM | POA: Diagnosis not present

## 2018-12-18 NOTE — Progress Notes (Signed)
   Subjective: 38 y.o. female with PMHx of diabetes mellitus presenting to the office today as a new patient with a chief complaint of a painful callus lesion noted to the right lateral fifth toe that appeared about one years ago. She also notes generalized plantar foot pain of the right foot that is worsened with walking. She has not had any treatment for her symptoms. Patient is here for further evaluation and treatment.   Past Medical History:  Diagnosis Date  . Asthma   . Diabetes mellitus without complication ([place])   . Hypothyroidism      Objective:  Physical Exam General: Alert and oriented x3 in no acute distress  Dermatology: Hyperkeratotic lesion(s) present on the sub-fifth MPJ of the right foot. Pain on palpation with a central nucleated core noted. Skin is warm, dry and supple bilateral lower extremities. Negative for open lesions or macerations.  Vascular: Palpable pedal pulses bilaterally. No edema or erythema noted. Capillary refill within normal limits.  Neurological: Epicritic and protective threshold grossly intact bilaterally.   Musculoskeletal Exam: Pain on palpation at the keratotic lesion(s) noted as well as to the 5th MPJ of the right foot. Range of motion within normal limits bilateral. Muscle strength 5/5 in all groups bilateral.  Assessment: 1. Right 5th MPJ capsulitis  2. Porokeratosis right sub-fifth MPJ   Plan of Care:  1. Patient evaluated 2. Excisional debridement of keratoic lesion(s) using a chisel blade was performed without incident.  3. Dressed area with light dressing. 4. Injection of 0.5 mLs Celestone Soluspan injected into the 5th MPJ of the right foot.  5. Recommended good shoe gear.  6. Patient is to return to the clinic PRN.   Edrick Kins, DPM [facility]  Dr. Edrick Kins, [place] [address]                                        [place], [place] [address]                Office [phone]  Fax [phone]

## 2018-12-19 ENCOUNTER — Telehealth: Payer: Self-pay

## 2018-12-19 NOTE — Telephone Encounter (Signed)
Called patient to do their pre-visit COVID screening.  Call went to voicemail. Unable to do prescreening.  

## 2018-12-22 ENCOUNTER — Ambulatory Visit: Payer: Medicaid Other

## 2019-01-02 ENCOUNTER — Telehealth: Payer: Self-pay

## 2019-01-02 NOTE — Telephone Encounter (Signed)
Called patient to do their pre-visit COVID screening.  Call went to voicemail. Unable to do prescreening.  

## 2019-01-05 ENCOUNTER — Ambulatory Visit (INDEPENDENT_AMBULATORY_CARE_PROVIDER_SITE_OTHER): Payer: Medicaid Other | Admitting: Family Medicine

## 2019-01-05 ENCOUNTER — Other Ambulatory Visit: Payer: Self-pay

## 2019-01-05 VITALS — BP 112/78 | HR 98 | Temp 97.5°F | Resp 17 | Wt 217.6 lb

## 2019-01-05 DIAGNOSIS — E1165 Type 2 diabetes mellitus with hyperglycemia: Secondary | ICD-10-CM | POA: Diagnosis not present

## 2019-01-05 DIAGNOSIS — E039 Hypothyroidism, unspecified: Secondary | ICD-10-CM | POA: Diagnosis not present

## 2019-01-05 DIAGNOSIS — R21 Rash and other nonspecific skin eruption: Secondary | ICD-10-CM

## 2019-01-05 MED ORDER — GLIPIZIDE ER 5 MG PO TB24: ORAL_TABLET | ORAL | 3 refills | Status: DC

## 2019-01-05 MED ORDER — LEVOTHYROXINE SODIUM 50 MCG PO TABS: 50.0000 ug | ORAL_TABLET | Freq: Every day | ORAL | 1 refills | Status: DC

## 2019-01-05 NOTE — Progress Notes (Signed)
Established Patient Office Visit  Subjective:  Patient ID: Dominique Mora, female    DOB: November 01, 1980  Age: 38 y.o. MRN: [id number]  CC:  Chief Complaint  Patient presents with  . Diabetes  . Hypothyroidism    HPI Dominique Mora, 38 year old female who is seen status post new patient visit on 11/24/2018 at which time she establish care of chronic medical issues including diabetes and hypothyroidism.  She also has asthma, tobacco dependence with continued use, history of anemia and diabetic peripheral neuropathy.  Since her last visit, she has been seen by podiatry on 12/15/2018 due to a painful callus/lesion on the right lateral fifth toe for which she had debridement of keratotic lesion and injection of steroid into the fifth MPJ joint of the right foot.  Diabetic foot exam done by podiatrist, Gala Lewandowsky.  Hemoglobin A1c at patient's visit on 11/24/2018 was elevated at 9.0.  Patient's blood sugar diary shows fasting blood sugars are in the low 200s and nonfasting in the mid to high 200s.  She reports that she is more compliant now with her medication and monitoring.  She still forgets to take her medications on some days and does not check her blood sugars every day but she is better at doing so.  She does have a list of her blood sugar readings with her at today's visit.  She still has some occasional numbness in some of her toes.  Her left little toe does feel better status post podiatry visit with removal of calluses and injection but she still has some pain.  She continues to have increased thirst and increased urinary frequency associated with her diabetes.  She reports that during her pregnancy, during the last 3 months, she was placed on insulin and had an insulin pump but she states that this greatly improved her control of her blood sugars.  She is wondering if she does need insulin if it would be possible to restart an insulin pump.  She realizes that her blood sugars likely remain elevated  due to what she chooses to eat and not taking her medication.  She is willing to try an additional medication by mouth to help with her blood sugars.  She also states that she did not receive a refill of her thyroid medication at the pharmacy after her last visit and she needs to have this refilled.  She reports some fatigue but she feels that this is more so to having 2 young children, she denies current issues with peripheral edema but she does feel as if she continues to gain weight but again she believes that this is related to food choices and decreased activity.  Past Medical History:  Diagnosis Date  . Asthma   . Diabetes mellitus without complication (HCC)   . Hypothyroidism     Past Surgical History:  Procedure Laterality Date  . CESAREAN SECTION N/A 01/16/2016   Procedure: CESAREAN SECTION;  Surgeon: Hermina Staggers, MD;  Location: [facility] BIRTHING SUITES;  Service: Obstetrics;  Laterality: N/A;  . CESAREAN SECTION N/A 07/06/2018   Procedure: CESAREAN SECTION;  Surgeon: Marrero Bing, MD;  Location: [facility] LD ORS;  Service: Obstetrics;  Laterality: N/A;  . TONSILLECTOMY      Family History  Problem Relation Age of Onset  . Schizophrenia Mother   . Alcohol abuse Mother   . Breast cancer Mother   . Diabetes Maternal Grandmother   . Arthritis/Rheumatoid Maternal Grandmother     Social History   Socioeconomic History  .  Marital status: Married    Spouse name: Not on file  . Number of children: Not on file  . Years of education: Not on file  . Highest education level: Not on file  Occupational History  . Not on file  Social Needs  . Financial resource strain: Not on file  . Food insecurity    Worry: Never true    Inability: Never true  . Transportation needs    Medical: No    Non-medical: No  Tobacco Use  . Smoking status: Current Every Day Smoker    Packs/day: 0.25    Types: Cigarettes  . Smokeless tobacco: Never Used  Substance and Sexual Activity  . Alcohol use: No   . Drug use: No  . Sexual activity: Yes    Birth control/protection: None  Lifestyle  . Physical activity    Days per week: Not on file    Minutes per session: Not on file  . Stress: Not on file  Relationships  . Social Herbalist on phone: Not on file    Gets together: Not on file    Attends religious service: Not on file    Active member of club or organization: Not on file    Attends meetings of clubs or organizations: Not on file    Relationship status: Not on file  . Intimate partner violence    Fear of current or ex partner: Not on file    Emotionally abused: Not on file    Physically abused: Not on file    Forced sexual activity: Not on file  Other Topics Concern  . Not on file  Social History Narrative  . Not on file    Outpatient Medications Prior to Visit  Medication Sig Dispense Refill  . albuterol (VENTOLIN HFA) 108 (90 Base) MCG/ACT inhaler Inhale 2 puffs into the lungs every 6 (six) hours as needed for wheezing. 18 g 3  . cetirizine (ZYRTEC) 10 MG tablet Take 1 tablet (10 mg total) by mouth daily. 90 tablet 3  . gabapentin (NEURONTIN) 300 MG capsule Take 1 capsule (300 mg total) by mouth at bedtime. For nerve pain 90 capsule 1  . levothyroxine (SYNTHROID) 50 MCG tablet Take 1 tablet (50 mcg total) by mouth daily before breakfast. Reported on 07/11/2015 90 tablet 1  . metFORMIN (GLUCOPHAGE) 500 MG tablet Two pills once daily after a meal 180 tablet 1  . METROGEL 1 % gel Apply topically 2 (two) times daily as needed. 60 g 0   No facility-administered medications prior to visit.     Allergies  Allergen Reactions  . Clindamycin/Lincomycin Nausea And Vomiting and Other (See Comments)    Causes dizziness.    ROS Review of Systems  Constitutional: Positive for fatigue. Negative for chills and fever.  HENT: Negative for sore throat and trouble swallowing.   Eyes: Negative for photophobia and visual disturbance.  Respiratory: Negative for cough and  shortness of breath.   Cardiovascular: Negative for chest pain, palpitations and leg swelling.  Gastrointestinal: Negative for abdominal pain, blood in stool, constipation, diarrhea and nausea.  Endocrine: Positive for polydipsia, polyphagia and polyuria.  Genitourinary: Positive for frequency. Negative for dysuria and flank pain.  Musculoskeletal: Negative for arthralgias and back pain.  Neurological: Negative for dizziness and headaches.  Hematological: Negative for adenopathy. Does not bruise/bleed easily.  Psychiatric/Behavioral: Negative for self-injury and suicidal ideas. The patient is nervous/anxious (Improved).       Objective:    Physical Exam  Constitutional: She is oriented to person, place, and time. She appears well-developed and well-nourished.  Overweight for height/obese female in no acute distress  Neck: Normal range of motion. Neck supple. No JVD present. No thyromegaly present.  Cardiovascular: Normal rate and regular rhythm.  Pulmonary/Chest: Effort normal and breath sounds normal.  Abdominal: Soft. There is no abdominal tenderness. There is no rebound and no guarding.  Truncal obesity  Musculoskeletal:        General: No tenderness or edema.  Lymphadenopathy:    She has no cervical adenopathy.  Neurological: She is alert and oriented to person, place, and time.  Skin: Skin is warm and dry. Rash (Rosacea and bilateral facial cheeks, improved from last visit) noted.  Psychiatric: She has a normal mood and affect. Her behavior is normal.  Nursing note and vitals reviewed.   BP 112/78   Pulse 98   Temp (!) 97.5 F (36.4 C) (Temporal)   Resp 17   Wt 217 lb 9.6 oz (98.7 kg)   SpO2 95%   BMI 37.35 kg/m  Wt Readings from Last 3 Encounters:  01/05/19 217 lb 9.6 oz (98.7 kg)  11/24/18 215 lb 12.8 oz (97.9 kg)  07/22/18 211 lb (95.7 kg)     Health Maintenance Due  Topic Date Due  . FOOT EXAM  12/18/1989  . OPHTHALMOLOGY EXAM  12/18/1989   Patient was  again offered but declined influenza immunization at today's visit.  Diabetic foot exam was recently done by podiatry   Lab Results  Component Value Date   TSH 2.920 11/24/2018   Lab Results  Component Value Date   WBC 11.2 (H) 11/24/2018   HGB 14.3 11/24/2018   HCT 42.9 11/24/2018   MCV 88 11/24/2018   PLT 383 11/24/2018   Lab Results  Component Value Date   NA 140 11/24/2018   K 4.8 11/24/2018   CO2 23 11/24/2018   GLUCOSE 277 (H) 11/24/2018   BUN 12 11/24/2018   CREATININE 0.91 11/24/2018   BILITOT 0.3 11/24/2018   ALKPHOS 61 11/24/2018   AST 40 11/24/2018   ALT 54 (H) 11/24/2018   PROT 7.6 11/24/2018   ALBUMIN 4.7 11/24/2018   CALCIUM 9.9 11/24/2018   ANIONGAP 9 01/14/2016   No results found for: CHOL No results found for: HDL No results found for: LDLCALC No results found for: TRIG No results found for: CHOLHDL Lab Results  Component Value Date   HGBA1C 9.0 (H) 11/24/2018      Assessment & Plan:  1. Type 2 diabetes mellitus with hyperglycemia, without long-term current use of insulin (HCC) Patient's blood sugars continue to remain elevated but she states that she is trying to be more compliant with taking her medications on a daily basis as well as monitoring her blood sugars.  Patient will continue the use of metformin and will add glipizide XL 5 mg once before the first meal of the day.  Patient feels that she will be more compliant with once daily extended release dosing.  Patient often does not get up until late in the day so she was told to take the Glucotrol XL before her first meal of the day not necessarily in the morning as she sometimes does not get up until the afternoon.  Patient can increase her Glucotrol dose to 5 mg daily if her blood sugars fail to be less than 160 fasting after 2 weeks of the 5 mg XL dose.  She is also asked to try and follow  a low carbohydrate diet.  Continue monitoring of blood sugars.  Patient will have repeat hemoglobin A1c in 3  months but should call sooner if she has any issues with the medication or if her blood sugars remain elevated.  Discussed possibility of endocrinology referral if sugars do not improve.  Also discussed that patient can likely control her blood sugars without the use of insulin if she makes dietary changes and is adherent to medications.  Patient used insulin during the end of her pregnancy and wondered if this would be a good option however discussed with patient that this would likely cause her to gain additional weight which patient does not wish to do if possible. - glipiZIDE (GLUCOTROL XL) 5 MG 24 hr tablet; One pill once per day before the first meal of the day. Can increase to 2 pills (10 mg) if fasting blood sugars remain above 160  Dispense: 60 tablet; Refill: 3  2. Hypothyroidism, unspecified type She reports that she did not receive a refill of her Synthroid at her last visit.  Patient's TSH was within normal at her last visit and she will continue her current dose of Synthroid as she is also not having any symptoms of hypo or hyperthyroidism at this time. - levothyroxine (SYNTHROID) 50 MCG tablet; Take 1 tablet (50 mcg total) by mouth daily before breakfast. Reported on 07/11/2015  Dispense: 90 tablet; Refill: 1  An After Visit Summary was printed and given to the patient.  Follow-up: Return in about 3 months (around 04/07/2019) for Diabetes and chronic issues-sooner if needed.  Cain Saupeammie Kaylyn Garrow, MD

## 2019-01-05 NOTE — Progress Notes (Signed)
States that she is getting better about taking her medications. Does have FSBS readings with her. Readings are high 200s fasting.

## 2019-01-05 NOTE — Patient Instructions (Addendum)
Continue Metformin.  New medication will be glipizide 5 mg extended release once daily before the first meal of the day.  If you have been taking the glipizide daily for 2 weeks and your blood sugars still remain greater than 160 fasting you can then increase to taking 2 pills (10 mg) each day.  Your goal fasting blood sugar is 140 or less and also to hopefully have your blood sugars back to around 160 or less within 2 hours of a meal.  Please call or return sooner if you have any difficulty with your blood sugars remaining too high or too low   Blood Glucose Monitoring, Adult Monitoring your blood sugar (glucose) is an important part of managing your diabetes (diabetes mellitus). Blood glucose monitoring involves checking your blood glucose as often as directed and keeping a record (log) of your results over time. Checking your blood glucose regularly and keeping a blood glucose log can:  Help you and your health care provider adjust your diabetes management plan as needed, including your medicines or insulin.  Help you understand how food, exercise, illnesses, and medicines affect your blood glucose.  Let you know what your blood glucose is at any time. You can quickly find out if you have low blood glucose (hypoglycemia) or high blood glucose (hyperglycemia). Your health care provider will set individualized treatment goals for you. Your goals will be based on your age, other medical conditions you have, and how you respond to diabetes treatment. Generally, the goal of treatment is to maintain the following blood glucose levels:  Before meals (preprandial): 80-130 mg/dL (3.5-3.2 mmol/L).  After meals (postprandial): below 180 mg/dL (10 mmol/L).  A1c level: less than 7%. Supplies needed:  Blood glucose meter.  Test strips for your meter. Each meter has its own strips. You must use the strips that came with your meter.  A needle to prick your finger (lancet). Do not use a lancet more than  one time.  A device that holds the lancet (lancing device).  A journal or log book to write down your results. How to check your blood glucose  1. Wash your hands with soap and water. 2. Prick the side of your finger (not the tip) with the lancet. Use a different finger each time. 3. Gently rub the finger until a small drop of blood appears. 4. Follow instructions that come with your meter for inserting the test strip, applying blood to the strip, and using your blood glucose meter. 5. Write down your result and any notes. Some meters allow you to use areas of your body other than your finger (alternative sites) to test your blood. The most common alternative sites are:  Forearm.  Thigh.  Palm of the hand. If you think you may have hypoglycemia, or if you have a history of not knowing when your blood glucose is getting low (hypoglycemia unawareness), do not use alternative sites. Use your finger instead. Alternative sites may not be as accurate as the fingers, because blood flow is slower in these areas. This means that the result you get may be delayed, and it may be different from the result that you would get from your finger. Follow these instructions at home: Blood glucose log   Every time you check your blood glucose, write down your result. Also write down any notes about things that may be affecting your blood glucose, such as your diet and exercise for the day. This information can help you and your health care  provider: ? Look for patterns in your blood glucose over time. ? Adjust your diabetes management plan as needed.  Check if your meter allows you to download your records to a computer. Most glucose meters store a record of glucose readings in the meter. If you have type 1 diabetes:  Check your blood glucose 2 or more times a day.  Also check your blood glucose: ? Before every insulin injection. ? Before and after exercise. ? Before meals. ? 2 hours after a  meal. ? Occasionally between 2:00 a.m. and 3:00 a.m., as directed. ? Before potentially dangerous tasks, like driving or using heavy machinery. ? At bedtime.  You may need to check your blood glucose more often, up to 6-10 times a day, if you: ? Use an insulin pump. ? Need multiple daily injections (MDI). ? Have diabetes that is not well-controlled. ? Are ill. ? Have a history of severe hypoglycemia. ? Have hypoglycemia unawareness. If you have type 2 diabetes:  If you take insulin or other diabetes medicines, check your blood glucose 2 or more times a day.  If you are on intensive insulin therapy, check your blood glucose 4 or more times a day. Occasionally, you may also need to check between 2:00 a.m. and 3:00 a.m., as directed.  Also check your blood glucose: ? Before and after exercise. ? Before potentially dangerous tasks, like driving or using heavy machinery.  You may need to check your blood glucose more often if: ? Your medicine is being adjusted. ? Your diabetes is not well-controlled. ? You are ill. General tips  Always keep your supplies with you.  If you have questions or need help, all blood glucose meters have a 24-hour "hotline" phone number that you can call. You may also contact your health care provider.  After you use a few boxes of test strips, adjust (calibrate) your blood glucose meter by following instructions that came with your meter. Contact a health care provider if:  Your blood glucose is at or above 240 mg/dL (13.3 mmol/L) for 2 days in a row.  You have been sick or have had a fever for 2 days or longer, and you are not getting better.  You have any of the following problems for more than 6 hours: ? You cannot eat or drink. ? You have nausea or vomiting. ? You have diarrhea. Get help right away if:  Your blood glucose is lower than 54 mg/dL (3 mmol/L).  You become confused or you have trouble thinking clearly.  You have difficulty  breathing.  You have moderate or large ketone levels in your urine. Summary  Monitoring your blood sugar (glucose) is an important part of managing your diabetes (diabetes mellitus).  Blood glucose monitoring involves checking your blood glucose as often as directed and keeping a record (log) of your results over time.  Your health care provider will set individualized treatment goals for you. Your goals will be based on your age, other medical conditions you have, and how you respond to diabetes treatment.  Every time you check your blood glucose, write down your result. Also write down any notes about things that may be affecting your blood glucose, such as your diet and exercise for the day. This information is not intended to replace advice given to you by your health care provider. Make sure you discuss any questions you have with your health care provider. Document Released: 03/01/2003 Document Revised: 12/20/2017 Document Reviewed: 08/08/2015 Elsevier Patient Education  2020 Elsevier Inc.  

## 2019-01-12 DIAGNOSIS — O24424 Gestational diabetes mellitus in childbirth, insulin controlled: Secondary | ICD-10-CM | POA: Diagnosis not present

## 2019-02-10 ENCOUNTER — Telehealth (INDEPENDENT_AMBULATORY_CARE_PROVIDER_SITE_OTHER): Payer: Medicaid Other | Admitting: Lactation Services

## 2019-02-10 DIAGNOSIS — E1140 Type 2 diabetes mellitus with diabetic neuropathy, unspecified: Secondary | ICD-10-CM

## 2019-02-10 NOTE — Telephone Encounter (Signed)
[facility] sent a fax for renewal of Insulin Pump Supplies. Called to inform them that Pt is no longer pregnant and care for Diabetes has been transferred to [facility] Medicine Physician,  Antony Blackbird, MD. Company was also notified that pt no longer on an insulin pump at this time.

## 2019-03-16 DIAGNOSIS — R519 Headache, unspecified: Secondary | ICD-10-CM | POA: Diagnosis not present

## 2019-03-16 DIAGNOSIS — Z20828 Contact with and (suspected) exposure to other viral communicable diseases: Secondary | ICD-10-CM | POA: Diagnosis not present

## 2019-04-03 ENCOUNTER — Telehealth: Payer: Self-pay

## 2019-04-03 NOTE — Telephone Encounter (Signed)
Called patient to do their pre-visit COVID screening.  Call went to voicemail. Unable to do prescreening.  

## 2019-04-06 ENCOUNTER — Ambulatory Visit: Payer: Medicaid Other

## 2019-04-09 ENCOUNTER — Telehealth: Payer: Self-pay

## 2019-04-09 NOTE — Telephone Encounter (Signed)
Called patient to do their pre-visit COVID screening.  Call went to voicemail. Unable to do prescreening.  

## 2019-04-10 ENCOUNTER — Ambulatory Visit (INDEPENDENT_AMBULATORY_CARE_PROVIDER_SITE_OTHER): Payer: Medicaid Other | Admitting: Internal Medicine

## 2019-04-10 ENCOUNTER — Other Ambulatory Visit: Payer: Self-pay

## 2019-04-10 ENCOUNTER — Encounter: Payer: Self-pay | Admitting: Internal Medicine

## 2019-04-10 VITALS — BP 124/88 | HR 69 | Temp 97.3°F | Resp 17 | Wt 209.4 lb

## 2019-04-10 DIAGNOSIS — Z13228 Encounter for screening for other metabolic disorders: Secondary | ICD-10-CM

## 2019-04-10 DIAGNOSIS — E1140 Type 2 diabetes mellitus with diabetic neuropathy, unspecified: Secondary | ICD-10-CM

## 2019-04-10 DIAGNOSIS — E039 Hypothyroidism, unspecified: Secondary | ICD-10-CM | POA: Diagnosis not present

## 2019-04-10 LAB — GLUCOSE, POCT (MANUAL RESULT ENTRY): POC Glucose: 244 mg/dl — AB (ref 70–99)

## 2019-04-10 LAB — POCT GLYCOSYLATED HEMOGLOBIN (HGB A1C): Hemoglobin A1C: 9 % — AB (ref 4.0–5.6)

## 2019-04-10 NOTE — Patient Instructions (Signed)

## 2019-04-10 NOTE — Progress Notes (Signed)
Subjective:    Dominique Mora - 39 y.o. female MRN [id number]  Date of birth: Oct 21, 1980  HPI  Dominique Mora is here for follow up of chronic medication conditions.  Diabetes mellitus, Type 2 Disease Monitoring             Blood Sugar Ranges: Not checking at home. Has a lot of stress at home. Forgets to take it at home. 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               Polyuria: no              Visual problems: no   Urine Microalbumin: normal Sept 2020   Last A1C: 9.0 (Sept 2020)   Medications: Glipizide 5 mg, Metformin 1000 mg daily  Medication Compliance: no, reports that she is terrible at taking her medications  Medication Side Effects             Hypoglycemia: no   Preventitive Health Care             Eye Exam: Referred.              Foot Exam: Done at podiatry visit in Oct 2020    Hypothyroidism  Prescribed Synthroid 50 mcg. Poor compliance. No fatigue, changes in hair/nails, hot/cold intolerance, or unintended weight changes.      Health Maintenance Due  Topic Date Due  . OPHTHALMOLOGY EXAM  12/18/1989    -  reports that she has been smoking cigarettes. She has been smoking about 0.25 packs per day. She has never used smokeless tobacco. - Review of Systems: Per HPI. - Past Medical History: Patient Active Problem List   Diagnosis Date Noted  . AKI (acute kidney injury) (HCC) 07/09/2018  . Pre-existing type 2 diabetes mellitus with hyperglycemia during pregnancy in third trimester (HCC) 01/17/2018  . DM (diabetes mellitus) (HCC) 08/30/2016  . Tobacco abuse 01/15/2016  . GAD (generalized anxiety disorder) 11/22/2015  . Asthma 07/12/2015  . PCO (polycystic ovaries) 07/12/2015  . Hypothyroid 07/12/2015  . Poor dentition 07/12/2015   - Medications: reviewed and updated   Objective:   Physical Exam BP 124/88   Pulse 69   Temp (!) 97.3 F (36.3 C) (Temporal)   Resp 17   Wt 209 lb 6.4 oz (95 kg)   SpO2 95%   BMI 35.94 kg/m  Physical Exam   Constitutional: She is oriented to person, place, and time and well-developed, well-nourished, and in no distress. No distress.  HENT:  Head: Normocephalic and atraumatic.  Eyes: Conjunctivae and EOM are normal.  Cardiovascular: Normal rate, regular rhythm and normal heart sounds.  No murmur heard. Pulmonary/Chest: Effort normal and breath sounds normal. No respiratory distress.  Musculoskeletal:        General: Normal range of motion.  Neurological: She is alert and oriented to person, place, and time.  Skin: Skin is warm and dry. She is not diaphoretic.  Psychiatric: Affect and judgment normal.           Assessment & Plan:   1. Type 2 diabetes mellitus with diabetic neuropathy, without long-term current use of insulin (HCC) A1c unchanged at 9.0. Not at goal. Encouraged compliance with medication by using pill boxes/phone alarms/post it notes in bathroom/etc. Will not increase medication dose or add additional agent as patient reports noncompliance.  Counseled on Diabetic diet, my plate method, 712 minutes of moderate intensity exercise/week Blood sugar logs with fasting goals of 80-120 mg/dl, random of less than 458  and in the event of sugars less than 60 mg/dl or greater than 400 mg/dl encouraged to notify the clinic. Advised on the need for annual eye exams, annual foot exams, Pneumonia vaccine. - HgB A1c - Glucose (CBG) - Ambulatory referral to Ophthalmology  2. Hypothyroidism, unspecified type TSH 4 months was in range in 2.92.  - TSH  3. Screening for metabolic disorder - Lipid panel   Phill Myron, D.O. 04/10/2019, 9:25 AM Primary Care at [facility]

## 2019-04-11 LAB — LIPID PANEL
Chol/HDL Ratio: 4.1 ratio (ref 0.0–4.4)
Cholesterol, Total: 203 mg/dL — ABNORMAL HIGH (ref 100–199)
HDL: 49 mg/dL (ref >39)
LDL Chol Calc (NIH): 110 mg/dL — ABNORMAL HIGH (ref 0–99)
Triglycerides: 253 mg/dL — ABNORMAL HIGH (ref 0–149)
VLDL Cholesterol Cal: 44 mg/dL — ABNORMAL HIGH (ref 5–40)

## 2019-04-11 LAB — TSH: TSH: 3.24 u[IU]/mL (ref 0.450–4.500)

## 2019-04-13 ENCOUNTER — Encounter: Payer: Self-pay | Admitting: Internal Medicine

## 2019-04-13 DIAGNOSIS — E785 Hyperlipidemia, unspecified: Secondary | ICD-10-CM | POA: Insufficient documentation

## 2019-05-19 DIAGNOSIS — H40033 Anatomical narrow angle, bilateral: Secondary | ICD-10-CM | POA: Diagnosis not present

## 2019-05-19 DIAGNOSIS — G44209 Tension-type headache, unspecified, not intractable: Secondary | ICD-10-CM | POA: Diagnosis not present

## 2019-05-27 ENCOUNTER — Telehealth: Payer: Self-pay | Admitting: Family Medicine

## 2019-05-27 NOTE — Telephone Encounter (Signed)
Patient is requesting to be switched back to glyBURIDE (DIABETA) 5 MG tablet [[id number]]  Patient states that the glipiZIDE (GLUCOTROL XL) 5 MG 24 hr tablet [[id number]]   is making her dizzy and believes it could be a possible allergic reaction similar to one she has with an antibiotic.   Patient also requesting a referral to [facility] in [facility]

## 2019-05-27 NOTE — Telephone Encounter (Signed)
Please advise on medication change. I did check Medicaid's preferred drug list for 2021 & both the Glipizide & Glyburide are on the preferred list.  Disregard referral request. This hasn't been discussed with a provider & she will need a visit prior to getting referred.

## 2019-05-28 ENCOUNTER — Encounter: Payer: Self-pay | Admitting: Nurse Practitioner

## 2019-05-28 ENCOUNTER — Other Ambulatory Visit: Payer: Self-pay | Admitting: Nurse Practitioner

## 2019-05-28 MED ORDER — GLYBURIDE 5 MG PO TABS: 5.0000 mg | ORAL_TABLET | Freq: Two times a day (BID) | ORAL | 0 refills | Status: AC

## 2019-05-29 DIAGNOSIS — H00024 Hordeolum internum left upper eyelid: Secondary | ICD-10-CM | POA: Diagnosis not present

## 2019-06-13 DIAGNOSIS — H00024 Hordeolum internum left upper eyelid: Secondary | ICD-10-CM | POA: Diagnosis not present

## 2019-07-09 ENCOUNTER — Ambulatory Visit: Payer: Medicaid Other | Admitting: Internal Medicine

## 2019-07-20 DIAGNOSIS — J189 Pneumonia, unspecified organism: Secondary | ICD-10-CM | POA: Diagnosis not present

## 2019-07-20 DIAGNOSIS — Z20828 Contact with and (suspected) exposure to other viral communicable diseases: Secondary | ICD-10-CM | POA: Diagnosis not present

## 2019-07-20 DIAGNOSIS — R062 Wheezing: Secondary | ICD-10-CM | POA: Diagnosis not present

## 2019-10-19 DIAGNOSIS — L040 Acute lymphadenitis of face, head and neck: Secondary | ICD-10-CM | POA: Diagnosis not present

## 2019-10-23 IMAGING — CR DG KNEE COMPLETE 4+V*L*
4 series · 4 of 4 positions shown · non-contrast
Comparison: None.

CLINICAL DATA: Slipped, twisting left knee.  Pain.

EXAM:
LEFT KNEE - COMPLETE 4+ VIEW

[knee ap]
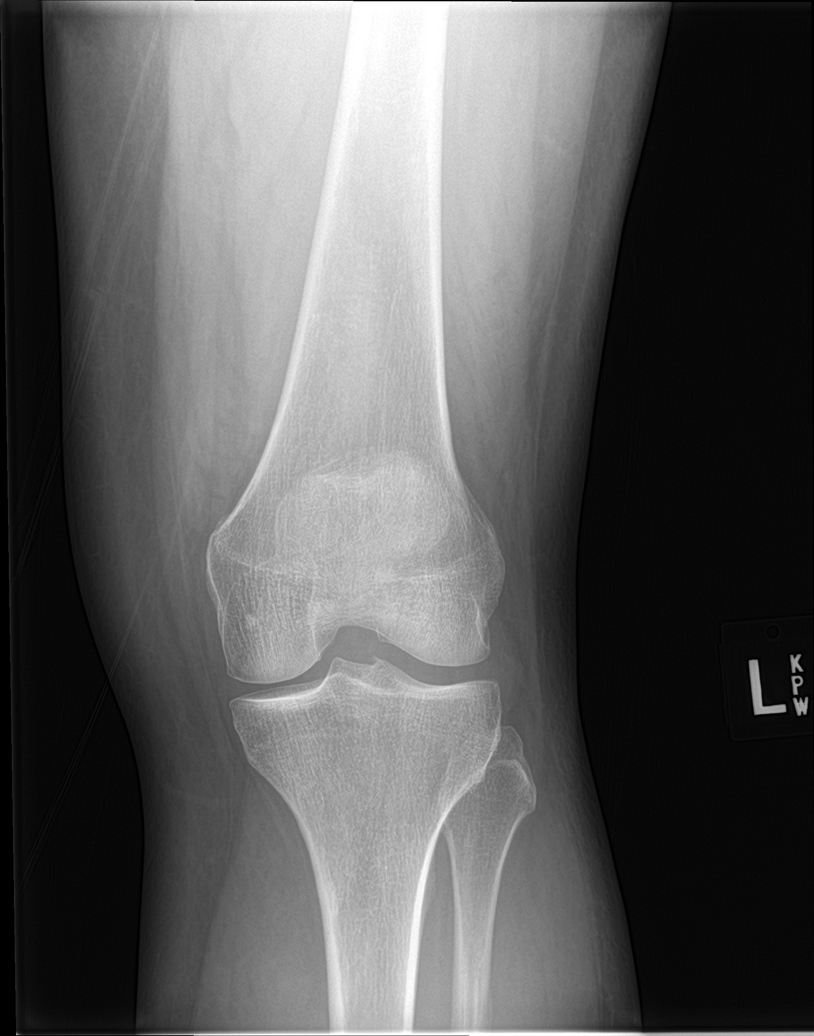

[knee lat]
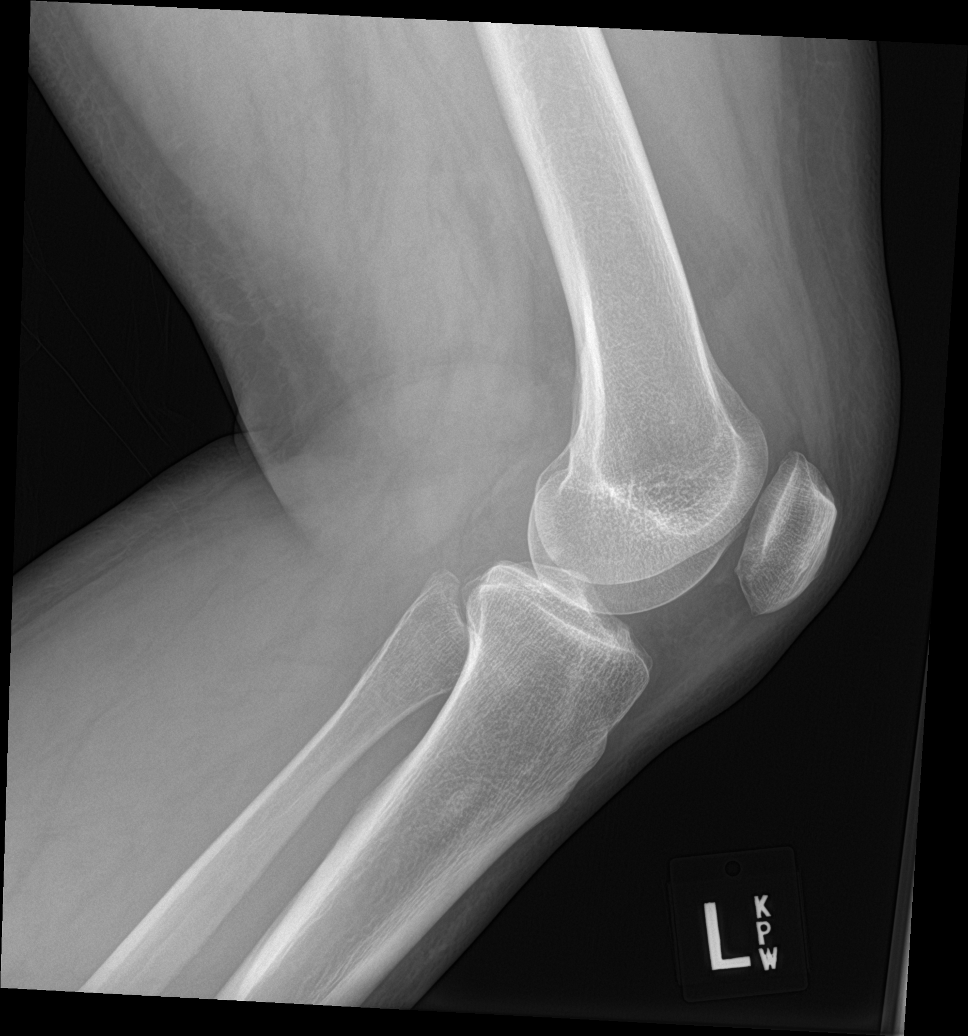

[knee obl (1 of 2)]
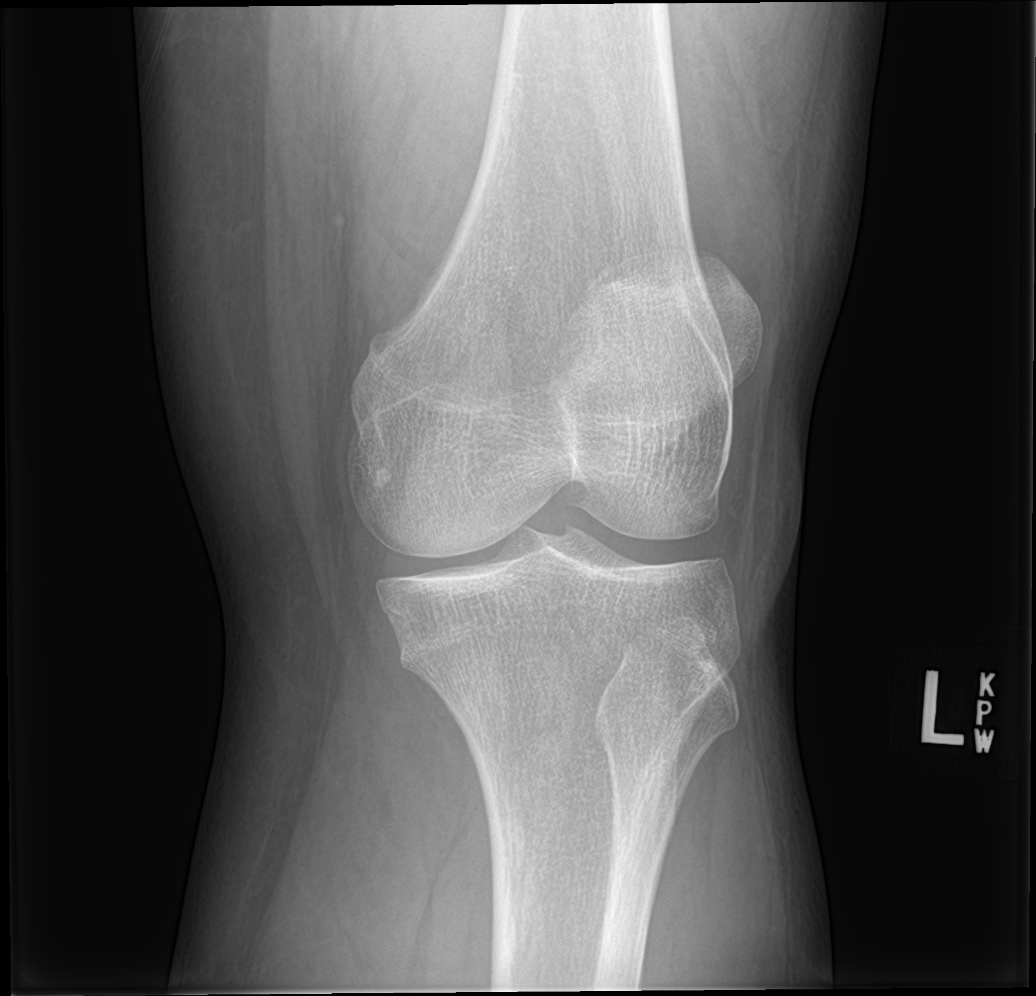

[knee obl (2 of 2)]
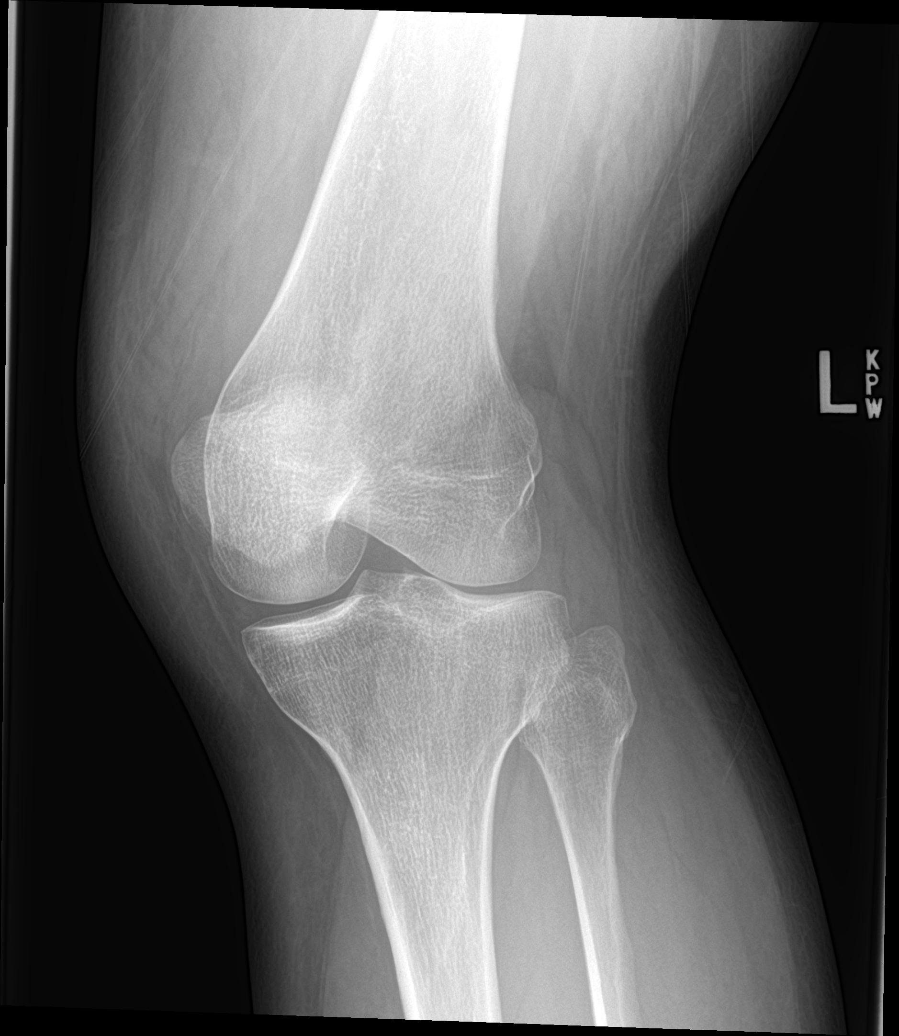

[4 of 4 positions shown; findings below may reference images not displayed]

FINDINGS: Small joint effusion. No acute bony abnormality. Specifically, no
fracture, subluxation, or dislocation. Joint spaces maintained.
IMPRESSION: Small joint effusion.  No acute bony abnormality.

## 2020-01-06 ENCOUNTER — Other Ambulatory Visit: Payer: Self-pay | Admitting: Family Medicine

## 2020-01-06 DIAGNOSIS — E1140 Type 2 diabetes mellitus with diabetic neuropathy, unspecified: Secondary | ICD-10-CM

## 2020-01-06 DIAGNOSIS — E039 Hypothyroidism, unspecified: Secondary | ICD-10-CM

## 2020-01-06 DIAGNOSIS — L299 Pruritus, unspecified: Secondary | ICD-10-CM

## 2020-01-06 DIAGNOSIS — E1142 Type 2 diabetes mellitus with diabetic polyneuropathy: Secondary | ICD-10-CM

## 2020-01-31 DIAGNOSIS — L02411 Cutaneous abscess of right axilla: Secondary | ICD-10-CM | POA: Diagnosis not present

## 2020-04-24 DIAGNOSIS — Z20828 Contact with and (suspected) exposure to other viral communicable diseases: Secondary | ICD-10-CM | POA: Diagnosis not present

## 2020-04-24 DIAGNOSIS — R070 Pain in throat: Secondary | ICD-10-CM | POA: Diagnosis not present

## 2020-04-24 DIAGNOSIS — J209 Acute bronchitis, unspecified: Secondary | ICD-10-CM | POA: Diagnosis not present

## 2020-04-24 DIAGNOSIS — J0100 Acute maxillary sinusitis, unspecified: Secondary | ICD-10-CM | POA: Diagnosis not present

## 2020-05-12 ENCOUNTER — Other Ambulatory Visit: Payer: Self-pay | Admitting: Obstetrics and Gynecology

## 2020-05-12 DIAGNOSIS — Z1231 Encounter for screening mammogram for malignant neoplasm of breast: Secondary | ICD-10-CM

## 2020-06-09 ENCOUNTER — Ambulatory Visit: Payer: Medicaid Other

## 2020-06-30 ENCOUNTER — Other Ambulatory Visit: Payer: Self-pay

## 2020-06-30 ENCOUNTER — Ambulatory Visit
Admission: RE | Admit: 2020-06-30 | Discharge: 2020-06-30 | Disposition: A | Discharge status: Home / Self Care | Payer: Medicaid Other | Source: Ambulatory Visit | Attending: Obstetrics and Gynecology | Admitting: Obstetrics and Gynecology

## 2020-06-30 ENCOUNTER — Ambulatory Visit: Payer: Medicaid Other

## 2020-06-30 ENCOUNTER — Other Ambulatory Visit: Payer: Self-pay | Admitting: Advanced Practice Midwife

## 2020-06-30 DIAGNOSIS — Z1231 Encounter for screening mammogram for malignant neoplasm of breast: Secondary | ICD-10-CM

## 2020-07-04 ENCOUNTER — Other Ambulatory Visit: Payer: Self-pay | Admitting: Nurse Practitioner

## 2020-07-04 ENCOUNTER — Other Ambulatory Visit: Payer: Self-pay | Admitting: Advanced Practice Midwife

## 2020-07-04 DIAGNOSIS — R928 Other abnormal and inconclusive findings on diagnostic imaging of breast: Secondary | ICD-10-CM

## 2020-07-05 ENCOUNTER — Telehealth: Payer: Self-pay

## 2020-07-05 ENCOUNTER — Ambulatory Visit: Payer: Medicaid Other | Admitting: Advanced Practice Midwife

## 2020-07-05 NOTE — Telephone Encounter (Signed)
Called pt to follow up on missed appt. Pt would like to reschedule. Front office notified to to reschedule.

## 2020-08-04 ENCOUNTER — Ambulatory Visit: Payer: Medicaid Other | Admitting: Certified Nurse Midwife

## 2020-08-15 ENCOUNTER — Ambulatory Visit
Admission: RE | Admit: 2020-08-15 | Discharge: 2020-08-15 | Disposition: A | Discharge status: Home / Self Care | Payer: Medicaid Other | Source: Ambulatory Visit | Attending: Advanced Practice Midwife | Admitting: Advanced Practice Midwife

## 2020-08-15 ENCOUNTER — Other Ambulatory Visit: Payer: Self-pay

## 2020-08-15 ENCOUNTER — Ambulatory Visit: Admission: RE | Admit: 2020-08-15 | Payer: Medicaid Other | Source: Ambulatory Visit

## 2020-08-15 DIAGNOSIS — R928 Other abnormal and inconclusive findings on diagnostic imaging of breast: Secondary | ICD-10-CM

## 2020-08-15 DIAGNOSIS — R922 Inconclusive mammogram: Secondary | ICD-10-CM | POA: Diagnosis not present

## 2021-01-25 DIAGNOSIS — H5213 Myopia, bilateral: Secondary | ICD-10-CM | POA: Diagnosis not present

## 2021-07-30 DIAGNOSIS — L255 Unspecified contact dermatitis due to plants, except food: Secondary | ICD-10-CM | POA: Diagnosis not present

## 2021-08-14 ENCOUNTER — Other Ambulatory Visit: Payer: Self-pay

## 2021-08-14 ENCOUNTER — Emergency Department (HOSPITAL_COMMUNITY)
Admission: EM | Admit: 2021-08-14 | Discharge: 2021-08-15 | Disposition: A | Discharge status: Home / Self Care | Payer: Medicaid Other | Attending: Emergency Medicine | Admitting: Emergency Medicine

## 2021-08-14 ENCOUNTER — Emergency Department (HOSPITAL_COMMUNITY): Payer: Medicaid Other

## 2021-08-14 ENCOUNTER — Encounter (HOSPITAL_COMMUNITY): Payer: Self-pay | Admitting: Emergency Medicine

## 2021-08-14 DIAGNOSIS — R102 Pelvic and perineal pain: Secondary | ICD-10-CM | POA: Diagnosis not present

## 2021-08-14 DIAGNOSIS — M549 Dorsalgia, unspecified: Secondary | ICD-10-CM | POA: Insufficient documentation

## 2021-08-14 DIAGNOSIS — N939 Abnormal uterine and vaginal bleeding, unspecified: Secondary | ICD-10-CM | POA: Diagnosis not present

## 2021-08-14 DIAGNOSIS — N399 Disorder of urinary system, unspecified: Secondary | ICD-10-CM | POA: Diagnosis not present

## 2021-08-14 LAB — CBC
HCT: 45.6 % (ref 36.0–46.0)
Hemoglobin: 14.6 g/dL (ref 12.0–15.0)
MCH: 30 pg (ref 26.0–34.0)
MCHC: 32 g/dL (ref 30.0–36.0)
MCV: 93.8 fL (ref 80.0–100.0)
Platelets: 407 10*3/uL — ABNORMAL HIGH (ref 150–400)
RBC: 4.86 MIL/uL (ref 3.87–5.11)
RDW: 13 % (ref 11.5–15.5)
WBC: 14.3 10*3/uL — ABNORMAL HIGH (ref 4.0–10.5)
nRBC: 0 % (ref 0.0–0.2)

## 2021-08-14 LAB — COMPREHENSIVE METABOLIC PANEL
ALT: 43 U/L (ref 0–44)
AST: 27 U/L (ref 15–41)
Albumin: 3.8 g/dL (ref 3.5–5.0)
Alkaline Phosphatase: 59 U/L (ref 38–126)
Anion gap: 13 (ref 5–15)
BUN: 13 mg/dL (ref 6–20)
CO2: 22 mmol/L (ref 22–32)
Calcium: 9.2 mg/dL (ref 8.9–10.3)
Chloride: 100 mmol/L (ref 98–111)
Creatinine, Ser: 0.76 mg/dL (ref 0.44–1.00)
GFR, Estimated: >60 mL/min (ref >60)
Glucose, Bld: 267 mg/dL — ABNORMAL HIGH (ref 70–99)
Potassium: 4.4 mmol/L (ref 3.5–5.1)
Sodium: 135 mmol/L (ref 135–145)
Total Bilirubin: 0.6 mg/dL (ref 0.3–1.2)
Total Protein: 7.3 g/dL (ref 6.5–8.1)

## 2021-08-14 LAB — URINALYSIS, ROUTINE W REFLEX MICROSCOPIC
Bilirubin Urine: NEGATIVE
Glucose, UA: >=500 mg/dL — AB
Ketones, ur: NEGATIVE mg/dL
Leukocytes,Ua: NEGATIVE
Nitrite: NEGATIVE
Protein, ur: NEGATIVE mg/dL
Specific Gravity, Urine: 1.027 (ref 1.005–1.030)
pH: 5 (ref 5.0–8.0)

## 2021-08-14 LAB — I-STAT BETA HCG BLOOD, ED (MC, WL, AP ONLY): I-stat hCG, quantitative: <5 m[IU]/mL (ref <5)

## 2021-08-14 MED ORDER — OXYCODONE HCL 5 MG PO TABS
5.0000 mg | ORAL_TABLET | Freq: Once | ORAL | Status: AC
  Administered 2021-08-14: 5 mg via ORAL
  Filled 2021-08-14: qty 1

## 2021-08-14 NOTE — ED Notes (Signed)
Pt returned from [facility]

## 2021-08-14 NOTE — ED Provider Triage Note (Signed)
Emergency Medicine Provider Triage Evaluation Note  Dominique Mora , a 41 y.o. female  was evaluated in triage.  Pt complains of vaginal bleeding for 9 days   Review of Systems  Positive: Lower abdominal pain Negative: fever  Physical Exam  BP (!) 139/95 (BP Location: Right Arm)   Pulse (!) 101   Temp 98.8 F (37.1 C) (Oral)   Resp 16   SpO2 98%  Gen:   Awake, no distress   Resp:  Normal effort  MSK:   Moves extremities without difficulty  Other:    Medical Decision Making  Medically screening exam initiated at 4:22 PM.  Appropriate orders placed.  Dominique Mora was informed that the remainder of the evaluation will be completed by another provider, this initial triage assessment does not replace that evaluation, and the importance of remaining in the ED until their evaluation is complete.     Elson Areas, [place] 08/14/21 1623

## 2021-08-14 NOTE — ED Provider Notes (Signed)
[facility] EMERGENCY DEPARTMENT Provider Note   CSN: [id number] Arrival date & time: 08/14/21  1522     History {Add pertinent medical, surgical, social history, OB history to HPI:1} Chief Complaint  Patient presents with   Vaginal Bleeding    Felipe Cabell is a 41 y.o. female.  Patient is a 41 year old female with no significant past medical history presenting for complaints of vaginal bleeding.  Patient states her cycles are normally very regular and lasting 5 days.  Patient states bleeding has been more heavy than normal and she is currently on day 5 of bleeding.  Denies current pregnancy.  Admits to suprapubic abdominal pain with radiation to the back and pelvic pressure.  Denies previous history of uterine prolapse.  Denies foreign body.  Denies increase urinary frequency, dysuria, or retention.  The history is provided by the patient. No language interpreter was used.  Vaginal Bleeding Associated symptoms: abdominal pain and back pain   Associated symptoms: no dysuria and no fever       Home Medications Prior to Admission medications   Medication Sig Start Date End Date Taking? Authorizing Provider  albuterol (VENTOLIN HFA) 108 (90 Base) MCG/ACT inhaler Inhale 2 puffs into the lungs every 6 (six) hours as needed for wheezing. 11/24/18   Fulp, Cammie, MD  cetirizine (ZYRTEC) 10 MG tablet TAKE 1 TABLET BY MOUTH DAILY 01/06/20   Fulp, Cammie, MD  gabapentin (NEURONTIN) 300 MG capsule TAKE 1 CAPSULE BY MOUTH EACH NIGHT AT BEDTIME FOR NERVE PAIN 01/06/20   Fulp, Cammie, MD  glyBURIDE (DIABETA) 5 MG tablet Take 1 tablet (5 mg total) by mouth 2 (two) times daily with a meal. 05/28/19 08/26/19  Claiborne Rigg, NP  levothyroxine (SYNTHROID) 50 MCG tablet TAKE 1 TABLET BY MOUTH DAILY BEFORE BREAKFAST 01/06/20   Fulp, Cammie, MD  metFORMIN (GLUCOPHAGE) 500 MG tablet TAKE 2 TABLETS BY MOUTH DAILY AFTER A MEAL 01/06/20   Fulp, Cammie, MD  METROGEL 1 % gel Apply  topically 2 (two) times daily as needed. 11/25/18   Fulp, Hewitt Shorts, MD      Allergies    Clindamycin/lincomycin and Glipizide er    Review of Systems   Review of Systems  Constitutional:  Negative for chills and fever.  HENT:  Negative for ear pain and sore throat.   Eyes:  Negative for pain and visual disturbance.  Respiratory:  Negative for cough and shortness of breath.   Cardiovascular:  Negative for chest pain and palpitations.  Gastrointestinal:  Positive for abdominal pain. Negative for vomiting.  Genitourinary:  Positive for vaginal bleeding. Negative for dysuria and hematuria.  Musculoskeletal:  Positive for back pain. Negative for arthralgias.  Skin:  Negative for color change and rash.  Neurological:  Negative for seizures and syncope.  All other systems reviewed and are negative.  Physical Exam Updated Vital Signs BP 130/75 (BP Location: Right Arm)   Pulse 67   Temp 98.4 F (36.9 C)   Resp 18   SpO2 98%  Physical Exam Vitals and nursing note reviewed.  Constitutional:      General: She is not in acute distress.    Appearance: She is well-developed.  HENT:     Head: Normocephalic and atraumatic.  Eyes:     Conjunctiva/sclera: Conjunctivae normal.  Cardiovascular:     Rate and Rhythm: Normal rate and regular rhythm.     Heart sounds: No murmur heard. Pulmonary:     Effort: Pulmonary effort is normal. No respiratory distress.  Breath sounds: Normal breath sounds.  Abdominal:     Palpations: Abdomen is soft.     Tenderness: There is no abdominal tenderness.  Musculoskeletal:        General: No swelling.     Cervical back: Neck supple.  Skin:    General: Skin is warm and dry.     Capillary Refill: Capillary refill takes less than 2 seconds.  Neurological:     Mental Status: She is alert.  Psychiatric:        Mood and Affect: Mood normal.    ED Results / Procedures / Treatments   Labs (all labs ordered are listed, but only abnormal results are  displayed) Labs Reviewed  COMPREHENSIVE METABOLIC PANEL - Abnormal; Notable for the following components:      Result Value   Glucose, Bld 267 (*)    All other components within normal limits  CBC - Abnormal; Notable for the following components:   WBC 14.3 (*)    Platelets 407 (*)    All other components within normal limits  URINALYSIS, ROUTINE W REFLEX MICROSCOPIC - Abnormal; Notable for the following components:   APPearance HAZY (*)    Glucose, UA >=500 (*)    Hgb urine dipstick LARGE (*)    Bacteria, UA RARE (*)    All other components within normal limits  I-STAT BETA HCG BLOOD, ED ([facility], [facility], [facility] ONLY)    EKG None  Radiology No results found.  Procedures Procedures  {Document cardiac monitor, telemetry assessment procedure when appropriate:1}  Medications Ordered in ED Medications - No data to display  ED Course/ Medical Decision Making/ A&P                           Medical Decision Making Amount and/or Complexity of Data Reviewed Labs: ordered. Radiology: ordered.   91:46 PM 41 year old female with no significant past medical history presenting for complaints of vaginal bleeding.  Patient is alert and oriented x3, no acute distress, afebrile, stable vital signs.  Physical exam demonstrates soft abdomen with tenderness palpation over suprapubic region.  Hemoglobin stable Patient offered pelvic exam and declined at this time.  Like to see ultrasound results first.  Transvaginal ultrasound ordered for concerns for uterine fibroids.     {Document critical care time when appropriate:1} {Document review of labs and clinical decision tools ie heart score, Chads2Vasc2 etc:1}  {Document your independent review of radiology images, and any outside records:1} {Document your discussion with family members, caretakers, and with consultants:1} {Document social determinants of health affecting pt's care:1} {Document your decision making why or why not admission, treatments  were needed:1} Final Clinical Impression(s) / ED Diagnoses Final diagnoses:  None    Rx / DC Orders ED Discharge Orders     None

## 2021-08-14 NOTE — ED Notes (Signed)
Pt to US.

## 2021-08-14 NOTE — ED Triage Notes (Signed)
Patient complains of vaginal bleeding that started nine days ago and lower back cramping. Patient took a home pregnancy test that was negative and states she takes precautions to prevent pregnancy. Patient is alert, oriented, and in no apparent distress at this time.

## 2021-08-16 ENCOUNTER — Telehealth: Payer: Self-pay

## 2021-08-16 NOTE — Telephone Encounter (Signed)
Transition Care Management Unsuccessful Follow-up Telephone Call  Date of discharge and from where:  08/15/2021 from [facility]  Attempts:  1st Attempt  Reason for unsuccessful TCM follow-up call:  Left voice message

## 2021-08-17 NOTE — Telephone Encounter (Signed)
Transition Care Management Follow-up Telephone Call Date of discharge and from where: 08/15/2021 from [facility] How have you been since you were released from the hospital? Patient stated that the uterine bleeding has stopped and patient is feeling better. Patient did not have any questions or concerns at this time.  Any questions or concerns? No  Items Reviewed: Did the pt receive and understand the discharge instructions provided? Yes  Medications obtained and verified? Yes  Other? No  Any new allergies since your discharge? No  Dietary orders reviewed? No Do you have support at home? Yes   Functional Questionnaire: (I = Independent and D = Dependent) ADLs: I  Bathing/Dressing- I  Meal Prep- I  Eating- I  Maintaining continence- I  Transferring/Ambulation- I  Managing Meds- I   Follow up appointments reviewed:  PCP Hospital f/u appt confirmed? No   Specialist Hospital f/u appt confirmed? No  Patient is calling OBGYN  Are transportation arrangements needed? No  If their condition worsens, is the pt aware to call PCP or go to the Emergency Dept.? Yes Was the patient provided with contact information for the PCP's office or ED? Yes Was to pt encouraged to call back with questions or concerns? Yes

## 2022-05-24 ENCOUNTER — Encounter (HOSPITAL_COMMUNITY): Payer: Self-pay

## 2022-05-24 ENCOUNTER — Emergency Department (HOSPITAL_COMMUNITY)
Admission: EM | Admit: 2022-05-24 | Discharge: 2022-05-25 | Disposition: A | Discharge status: Home / Self Care | Payer: Medicaid Other | Attending: Emergency Medicine | Admitting: Emergency Medicine

## 2022-05-24 DIAGNOSIS — Z7951 Long term (current) use of inhaled steroids: Secondary | ICD-10-CM | POA: Insufficient documentation

## 2022-05-24 DIAGNOSIS — J45909 Unspecified asthma, uncomplicated: Secondary | ICD-10-CM | POA: Insufficient documentation

## 2022-05-24 DIAGNOSIS — R42 Dizziness and giddiness: Secondary | ICD-10-CM

## 2022-05-24 DIAGNOSIS — D72829 Elevated white blood cell count, unspecified: Secondary | ICD-10-CM | POA: Insufficient documentation

## 2022-05-24 DIAGNOSIS — E1165 Type 2 diabetes mellitus with hyperglycemia: Secondary | ICD-10-CM | POA: Insufficient documentation

## 2022-05-24 DIAGNOSIS — Z79899 Other long term (current) drug therapy: Secondary | ICD-10-CM | POA: Insufficient documentation

## 2022-05-24 DIAGNOSIS — R519 Headache, unspecified: Secondary | ICD-10-CM

## 2022-05-24 DIAGNOSIS — Z7984 Long term (current) use of oral hypoglycemic drugs: Secondary | ICD-10-CM | POA: Insufficient documentation

## 2022-05-24 DIAGNOSIS — E039 Hypothyroidism, unspecified: Secondary | ICD-10-CM | POA: Insufficient documentation

## 2022-05-24 LAB — BASIC METABOLIC PANEL
Anion gap: 15 (ref 5–15)
BUN: 7 mg/dL (ref 6–20)
CO2: 21 mmol/L — ABNORMAL LOW (ref 22–32)
Calcium: 9.2 mg/dL (ref 8.9–10.3)
Chloride: 99 mmol/L (ref 98–111)
Creatinine, Ser: 0.72 mg/dL (ref 0.44–1.00)
GFR, Estimated: >60 mL/min (ref >60)
Glucose, Bld: 255 mg/dL — ABNORMAL HIGH (ref 70–99)
Potassium: 3.9 mmol/L (ref 3.5–5.1)
Sodium: 135 mmol/L (ref 135–145)

## 2022-05-24 LAB — I-STAT BETA HCG BLOOD, ED (MC, WL, AP ONLY): I-stat hCG, quantitative: <5 m[IU]/mL (ref <5)

## 2022-05-24 LAB — URINALYSIS, ROUTINE W REFLEX MICROSCOPIC
Bilirubin Urine: NEGATIVE
Glucose, UA: 150 mg/dL — AB
Hgb urine dipstick: NEGATIVE
Ketones, ur: 20 mg/dL — AB
Leukocytes,Ua: NEGATIVE
Nitrite: NEGATIVE
Protein, ur: NEGATIVE mg/dL
Specific Gravity, Urine: 1.011 (ref 1.005–1.030)
pH: 7 (ref 5.0–8.0)

## 2022-05-24 LAB — CBG MONITORING, ED: Glucose-Capillary: 226 mg/dL — ABNORMAL HIGH (ref 70–99)

## 2022-05-24 LAB — CBC
HCT: 46.1 % — ABNORMAL HIGH (ref 36.0–46.0)
Hemoglobin: 15.5 g/dL — ABNORMAL HIGH (ref 12.0–15.0)
MCH: 30.3 pg (ref 26.0–34.0)
MCHC: 33.6 g/dL (ref 30.0–36.0)
MCV: 90 fL (ref 80.0–100.0)
Platelets: 394 10*3/uL (ref 150–400)
RBC: 5.12 MIL/uL — ABNORMAL HIGH (ref 3.87–5.11)
RDW: 13 % (ref 11.5–15.5)
WBC: 14.4 10*3/uL — ABNORMAL HIGH (ref 4.0–10.5)
nRBC: 0 % (ref 0.0–0.2)

## 2022-05-24 MED ORDER — MECLIZINE HCL 25 MG PO TABS
25.0000 mg | ORAL_TABLET | Freq: Once | ORAL | Status: AC
  Administered 2022-05-24: 25 mg via ORAL
  Filled 2022-05-24: qty 1

## 2022-05-24 NOTE — ED Provider Triage Note (Signed)
Emergency Medicine Provider Triage Evaluation Note  Dominique Mora , a 42 y.o. female  was evaluated in triage.  Pt complains of room and dizziness worse with head movement, ringing in her ear.  Patient states that she has had intermittent episodes of vertigo in the past that only last about 10 minutes but this has been all days.  She has a history of hypothyroidism and diabetes and does not see doctors or take any medications for treatment..  Review of Systems  Positive: Vertigo Negative: Nystagmus  Physical Exam  BP (!) 164/101   Pulse 93   Temp 97.6 F (36.4 C) (Oral)   Resp 16   Ht '5\' 4"'$  (1.626 m)   Wt 86.2 kg   LMP 05/10/2022   SpO2 93%   BMI 32.61 kg/m  Gen:   Awake, no distress   Resp:  Normal effort  MSK:   Moves extremities without difficulty  Other:    Medical Decision Making  Medically screening exam initiated at 6:33 PM.  Appropriate orders placed.  Jeannetta Luiz was informed that the remainder of the evaluation will be completed by another provider, this initial triage assessment does not replace that evaluation, and the importance of remaining in the ED until their evaluation is complete.     Margarita Mail, PA-C 05/24/22 [phone]

## 2022-05-24 NOTE — ED Notes (Signed)
Pt given specimen cup for urine sample.

## 2022-05-24 NOTE — ED Triage Notes (Signed)
Pt BIBA from home. Pt c/o dizziness, nausea and vomiting since 0800. Dizziness is better when laying down, when sitting she gets more dizzy. Pt also c/o photosensitivity. Pt also c/o ear pain.   4 mg zofran 500 cc NS NSR with EMS

## 2022-05-25 MED ORDER — PROCHLORPERAZINE EDISYLATE 10 MG/2ML IJ SOLN
10.0000 mg | Freq: Once | INTRAMUSCULAR | Status: AC
  Administered 2022-05-25: 10 mg via INTRAVENOUS
  Filled 2022-05-25: qty 2

## 2022-05-25 MED ORDER — KETOROLAC TROMETHAMINE 15 MG/ML IJ SOLN
15.0000 mg | Freq: Once | INTRAMUSCULAR | Status: AC
  Administered 2022-05-25: 15 mg via INTRAVENOUS
  Filled 2022-05-25: qty 1

## 2022-05-25 MED ORDER — MECLIZINE HCL 25 MG PO TABS
25.0000 mg | ORAL_TABLET | Freq: Three times a day (TID) | ORAL | 0 refills | Status: AC | PRN
Start: 2022-05-25 — End: ?

## 2022-05-25 MED ORDER — ONDANSETRON 4 MG PO TBDP: 4.0000 mg | ORAL_TABLET | Freq: Three times a day (TID) | ORAL | 0 refills | Status: AC | PRN

## 2022-05-25 NOTE — ED Provider Notes (Signed)
[facility] Provider Note  CSN: [id number] Arrival date & time: 05/24/22 1811  Chief Complaint(s) Dizziness  HPI Dominique Mora is a 42 y.o. female with a past medical history listed below including type 2 diabetes who presents to the emergency department with 1 day of dizziness noted this morning.  Worse with changing positions and head movement.  Associated with nausea without significant emesis.  No recent fevers or infections.  Reports that she has been having some ear discomfort for the past several weeks and believes it might be related to this.  This evening, she began having headache.  Reports a history of migraine headaches that only presents a handful of times during the year.  This headache is consistent with her previous headaches.  Patient reports previous episodes of brief vertiginous symptoms that resolve on their own.  Since this has been ongoing all day, she presented for assistance.  The history is provided by the patient.    Past Medical History Past Medical History:  Diagnosis Date   Asthma    Diabetes mellitus without complication ([place])    H/O cesarean section 12/30/2017   [place] presentation 34 weeks PPROM  Counseled regarding TOLAC vs RCS; risks/benefits discussed in detail. All questions answered.  Patient elects for TOLAC if she goes into spontaneous labor, consent signed 05/26/2018. She will want to be scheduled for RCS if no spontaneous labor by 39 weeks   History of preterm delivery 12/30/2017   33.5 weeks PPROM   Hypothyroidism    Patient Active Problem List   Diagnosis Date Noted   Hyperlipidemia 04/13/2019   AKI (acute kidney injury) ([place]) 07/09/2018   Pre-existing type 2 diabetes mellitus with hyperglycemia during pregnancy in third trimester ([place]) 01/17/2018   DM (diabetes mellitus) ([place]) 08/30/2016   Tobacco abuse 01/15/2016   GAD (generalized anxiety disorder) 11/22/2015   Asthma 07/12/2015   PCO  (polycystic ovaries) 07/12/2015   Hypothyroid 07/12/2015   Poor dentition 07/12/2015   Home Medication(s) Prior to Admission medications   Medication Sig Start Date End Date Taking? Authorizing Provider  meclizine (ANTIVERT) 25 MG tablet Take 1 tablet (25 mg total) by mouth 3 (three) times daily as needed for dizziness. 05/25/22  Yes Jewett Mcgann, Grayce Sessions, MD  ondansetron (ZOFRAN-ODT) 4 MG disintegrating tablet Take 1 tablet (4 mg total) by mouth every 8 (eight) hours as needed for up to 3 days for nausea or vomiting. 05/25/22 05/28/22 Yes Charonda Hefter, Grayce Sessions, MD  albuterol (VENTOLIN HFA) 108 (90 Base) MCG/ACT inhaler Inhale 2 puffs into the lungs every 6 (six) hours as needed for wheezing. 11/24/18   Fulp, Cammie, MD  cetirizine (ZYRTEC) 10 MG tablet TAKE 1 TABLET BY MOUTH DAILY 01/06/20   Fulp, Cammie, MD  gabapentin (NEURONTIN) 300 MG capsule TAKE 1 CAPSULE BY MOUTH EACH NIGHT AT BEDTIME FOR NERVE PAIN 01/06/20   Fulp, Cammie, MD  glyBURIDE (DIABETA) 5 MG tablet Take 1 tablet (5 mg total) by mouth 2 (two) times daily with a meal. 05/28/19 08/26/19  Gildardo Pounds, NP  levothyroxine (SYNTHROID) 50 MCG tablet TAKE 1 TABLET BY MOUTH DAILY BEFORE BREAKFAST 01/06/20   Fulp, Cammie, MD  metFORMIN (GLUCOPHAGE) 500 MG tablet TAKE 2 TABLETS BY MOUTH DAILY AFTER A MEAL 01/06/20   Fulp, Cammie, MD  METROGEL 1 % gel Apply topically 2 (two) times daily as needed. 11/25/18   Antony Blackbird, MD  Allergies Clindamycin/lincomycin and Glipizide er  Review of Systems Review of Systems As noted in HPI  Physical Exam Vital Signs  I have reviewed the triage vital signs BP 122/81 (BP Location: Right Arm)   Pulse 89   Temp 99.5 F (37.5 C) (Oral)   Resp 16   Ht 5\' 4"  (1.626 m)   Wt 86.2 kg   LMP 05/10/2022   SpO2 95%   BMI 32.61 kg/m   Physical Exam Vitals reviewed.   Constitutional:      General: She is not in acute distress.    Appearance: She is well-developed. She is not diaphoretic.  HENT:     Head: Normocephalic and atraumatic.     Right Ear: Tympanic membrane and external ear normal. Tympanic membrane is not injected or erythematous.     Left Ear: Tympanic membrane and external ear normal. Tympanic membrane is not injected or erythematous.     Nose: Nose normal.  Eyes:     General: No scleral icterus.    Conjunctiva/sclera: Conjunctivae normal.  Neck:     Trachea: Phonation normal.  Cardiovascular:     Rate and Rhythm: Regular rhythm.  Pulmonary:     Effort: Pulmonary effort is normal. No respiratory distress.     Breath sounds: No stridor.  Abdominal:     General: There is no distension.  Musculoskeletal:        General: Normal range of motion.     Cervical back: Normal range of motion.  Neurological:     Mental Status: She is alert and oriented to person, place, and time.     Comments: Mental Status:  Alert and oriented to person, place, and time.  Attention and concentration normal.  Speech clear.  Recent memory is intact  Cranial Nerves:  II Visual Fields: Intact to confrontation. Visual fields intact. III, IV, VI: Pupils equal and reactive to light and near. Full eye movement with unilateral nystagmus (fast beat to left) V Facial Sensation: Normal. No weakness of masticatory muscles  VII: No facial weakness or asymmetry  VIII Auditory Acuity: Grossly normal  IX/X: The uvula is midline; the palate elevates symmetrically  XI: Normal sternocleidomastoid and trapezius strength  XII: The tongue is midline. No atrophy or fasciculations.   Motor System: Muscle Strength: 5/5 and symmetric in the upper and lower extremities. No pronation or drift.  Muscle Tone: Tone and muscle bulk are normal in the upper and lower extremities.  Coordination: Intact finger-to-nose. No tremor.  Sensation: Intact to light touch Gait: mildly  ataxic  HINTS Plus: Nystagmus: unilateral nystagmus (fast beat to left)  Head impulse: abnormal Skew: normal Hearing: intact      Psychiatric:        Behavior: Behavior normal.     ED Results and Treatments Labs (all labs ordered are listed, but only abnormal results are displayed) Labs Reviewed  BASIC METABOLIC PANEL - Abnormal; Notable for the following components:      Result Value   CO2 21 (*)    Glucose, Bld 255 (*)    All other components within normal limits  CBC - Abnormal; Notable for the following components:   WBC 14.4 (*)    RBC 5.12 (*)    Hemoglobin 15.5 (*)    HCT 46.1 (*)    All other components within normal limits  URINALYSIS, ROUTINE W REFLEX MICROSCOPIC - Abnormal; Notable for the following components:   APPearance HAZY (*)    Glucose, UA 150 (*)  Ketones, ur 20 (*)    All other components within normal limits  CBG MONITORING, ED - Abnormal; Notable for the following components:   Glucose-Capillary 226 (*)    All other components within normal limits  I-STAT BETA HCG BLOOD, ED ([facility], [facility], [facility] ONLY)                                                                                                                         EKG  EKG Interpretation  Date/Time:  Thursday May 24 2022 18:16:46 EDT Ventricular Rate:  88 PR Interval:  204 QRS Duration: 80 QT Interval:  382 QTC Calculation: 462 R Axis:   43 Text Interpretation: Normal sinus rhythm Normal ECG No previous ECGs available Confirmed by Addison Lank [phone]) on 05/25/2022 12:03:07 AM       Radiology No results found.  Medications Ordered in ED Medications  meclizine (ANTIVERT) tablet 25 mg (25 mg Oral Given 05/24/22 1836)  prochlorperazine (COMPAZINE) injection 10 mg (10 mg Intravenous Given 05/25/22 0015)  ketorolac (TORADOL) 15 MG/ML injection 15 mg (15 mg Intravenous Given 05/25/22 0016)                                                                                                                                      Procedures Procedures  (including critical care time)  Medical Decision Making / ED Course  Click here for ABCD2, HEART and other calculators  Medical Decision Making Amount and/or Complexity of Data Reviewed Labs: ordered. Decision-making details documented in ED Course. ECG/medicine tests: ordered and independent interpretation performed. Decision-making details documented in ED Course.  Risk Prescription drug management.    Patient presents with dizziness Seen in the MSE process and had labs obtained given her history of diabetes, CBC with leukocytosis similar to her baseline. Metabolic panel without significant electrolyte derangements.  Hyperglycemia without DKA.  No renal sufficiency. UPT obtained to assist in guiding patient care which was negative. UA without evidence of infection  EKG without acute dysrhythmias or blocks.  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  Patient now complaining of her typical headache. Given a migraine cocktail.  Will reassess. Headache improved.         Final Clinical Impression(s) / ED Diagnoses Final diagnoses:  Dizziness  Nonintractable episodic  headache, unspecified headache type   The patient appears reasonably screened and/or stabilized for discharge and I doubt any other medical condition or other [facility] requiring further screening, evaluation, or treatment in the ED at this time. I have discussed the findings, Dx and Tx plan with the patient/family who expressed understanding and agree(s) with the plan. Discharge instructions discussed at length. The patient/family was given strict return precautions who verbalized understanding of the instructions. No further questions at time of discharge.  Disposition: Discharge  Condition: Good  ED Discharge Orders           Ordered    meclizine (ANTIVERT) 25 MG tablet  3 times daily PRN        05/25/22 0101    ondansetron (ZOFRAN-ODT) 4 MG disintegrating tablet  Every 8 hours PRN        05/25/22 0101    PT vestibular rehab        05/25/22 0104              Follow Up: Antony Blackbird, MD [address] [place] [address] [phone]  Call  to schedule an appointment for close follow up           This chart was dictated using voice recognition software.  Despite best efforts to proofread,  errors can occur which can change the documentation meaning.    Fatima Blank, MD 05/25/22 [phone]

## 2022-05-25 NOTE — ED Provider Notes (Incomplete)
[facility] Provider Note  CSN: [id number] Arrival date & time: 05/24/22 1811  Chief Complaint(s) Dizziness  HPI Dominique Mora is a 42 y.o. female {Add pertinent medical, surgical, social history, OB history to HPI:1}    Dizziness   Past Medical History Past Medical History:  Diagnosis Date  . Asthma   . Diabetes mellitus without complication ([place])   . H/O cesarean section 12/30/2017   [place] presentation 34 weeks PPROM  Counseled regarding TOLAC vs RCS; risks/benefits discussed in detail. All questions answered.  Patient elects for TOLAC if she goes into spontaneous labor, consent signed 05/26/2018. She will want to be scheduled for RCS if no spontaneous labor by 39 weeks  . History of preterm delivery 12/30/2017   33.5 weeks PPROM  . Hypothyroidism    Patient Active Problem List   Diagnosis Date Noted  . Hyperlipidemia 04/13/2019  . AKI (acute kidney injury) ([place]) 07/09/2018  . Pre-existing type 2 diabetes mellitus with hyperglycemia during pregnancy in third trimester ([place]) 01/17/2018  . DM (diabetes mellitus) ([place]) 08/30/2016  . Tobacco abuse 01/15/2016  . GAD (generalized anxiety disorder) 11/22/2015  . Asthma 07/12/2015  . PCO (polycystic ovaries) 07/12/2015  . Hypothyroid 07/12/2015  . Poor dentition 07/12/2015   Home Medication(s) Prior to Admission medications   Medication Sig Start Date End Date Taking? Authorizing Provider  albuterol (VENTOLIN HFA) 108 (90 Base) MCG/ACT inhaler Inhale 2 puffs into the lungs every 6 (six) hours as needed for wheezing. 11/24/18   Fulp, Cammie, MD  cetirizine (ZYRTEC) 10 MG tablet TAKE 1 TABLET BY MOUTH DAILY 01/06/20   Fulp, Cammie, MD  gabapentin (NEURONTIN) 300 MG capsule TAKE 1 CAPSULE BY MOUTH EACH NIGHT AT BEDTIME FOR NERVE PAIN 01/06/20   Fulp, Cammie, MD  glyBURIDE (DIABETA) 5 MG tablet Take 1 tablet (5 mg total) by mouth 2 (two) times daily with a meal. 05/28/19 08/26/19   Gildardo Pounds, NP  levothyroxine (SYNTHROID) 50 MCG tablet TAKE 1 TABLET BY MOUTH DAILY BEFORE BREAKFAST 01/06/20   Fulp, Cammie, MD  metFORMIN (GLUCOPHAGE) 500 MG tablet TAKE 2 TABLETS BY MOUTH DAILY AFTER A MEAL 01/06/20   Fulp, Cammie, MD  METROGEL 1 % gel Apply topically 2 (two) times daily as needed. 11/25/18   Antony Blackbird, MD                                                                                                                                    Allergies Clindamycin/lincomycin and Glipizide er  Review of Systems Review of Systems  Neurological:  Positive for dizziness.   As noted in HPI  Physical Exam Vital Signs  I have reviewed the triage vital signs BP 122/81 (BP Location: Right Arm)   Pulse 89   Temp 99.5 F (37.5 C) (Oral)   Resp 16   Ht '5\' 4"'$  (1.626 m)   Wt 86.2 kg  LMP 05/10/2022   SpO2 95%   BMI 32.61 kg/m  *** Physical Exam  ED Results and Treatments Labs (all labs ordered are listed, but only abnormal results are displayed) Labs Reviewed  BASIC METABOLIC PANEL - Abnormal; Notable for the following components:      Result Value   CO2 21 (*)    Glucose, Bld 255 (*)    All other components within normal limits  CBC - Abnormal; Notable for the following components:   WBC 14.4 (*)    RBC 5.12 (*)    Hemoglobin 15.5 (*)    HCT 46.1 (*)    All other components within normal limits  URINALYSIS, ROUTINE W REFLEX MICROSCOPIC - Abnormal; Notable for the following components:   APPearance HAZY (*)    Glucose, UA 150 (*)    Ketones, ur 20 (*)    All other components within normal limits  CBG MONITORING, ED - Abnormal; Notable for the following components:   Glucose-Capillary 226 (*)    All other components within normal limits  I-STAT BETA HCG BLOOD, ED ([facility], [facility], [facility] ONLY)                                                                                                                         EKG  EKG Interpretation  Date/Time:    Ventricular  Rate:    PR Interval:    QRS Duration:   QT Interval:    QTC Calculation:   R Axis:     Text Interpretation:         Radiology No results found.  Medications Ordered in ED Medications  meclizine (ANTIVERT) tablet 25 mg (25 mg Oral Given 05/24/22 1836)                                                                                                                                     Procedures Procedures  (including critical care time)  Medical Decision Making / ED Course  Click here for ABCD2, HEART and other calculators  Medical Decision Making Amount and/or Complexity of Data Reviewed Labs: ordered.          Final Clinical Impression(s) / ED Diagnoses Final diagnoses:  None    {Document critical care time when appropriate:1}  {Document review of labs and clinical decision tools ie heart score, Chads2Vasc2 etc:1}  {Document your independent review of radiology images, and any  outside records:1} {Document your discussion with family members, caretakers, and with consultants:1} {Document social determinants of health affecting pt's care:1} {Document your decision making why or why not admission, treatments were needed:1} This chart was dictated using voice recognition software.  Despite best efforts to proofread,  errors can occur which can change the documentation meaning.
